# Patient Record
Sex: Female | Born: 1975 | Race: White | Hispanic: No | Marital: Married | Smoking: Never smoker
Health system: Southern US, Community
[De-identification: ages and names within clinical notes are randomized; demographics above are authoritative.]

## PROBLEM LIST (undated history)

## (undated) DIAGNOSIS — R011 Cardiac murmur, unspecified: Secondary | ICD-10-CM

## (undated) DIAGNOSIS — T7840XA Allergy, unspecified, initial encounter: Secondary | ICD-10-CM

## (undated) DIAGNOSIS — C50919 Malignant neoplasm of unspecified site of unspecified female breast: Secondary | ICD-10-CM

## (undated) DIAGNOSIS — Z1501 Genetic susceptibility to malignant neoplasm of breast: Secondary | ICD-10-CM

## (undated) DIAGNOSIS — K219 Gastro-esophageal reflux disease without esophagitis: Secondary | ICD-10-CM

## (undated) DIAGNOSIS — O1490 Unspecified pre-eclampsia, unspecified trimester: Secondary | ICD-10-CM

## (undated) HISTORY — DX: Allergy, unspecified, initial encounter: T78.40XA

## (undated) HISTORY — PX: INGUINAL HERNIA REPAIR: SHX194

## (undated) HISTORY — PX: SHOULDER SURGERY: SHX246

---

## 2000-12-21 ENCOUNTER — Other Ambulatory Visit: Admission: RE | Admit: 2000-12-21 | Discharge: 2000-12-21 | Payer: Self-pay | Admitting: Obstetrics and Gynecology

## 2002-01-17 ENCOUNTER — Other Ambulatory Visit: Admission: RE | Admit: 2002-01-17 | Discharge: 2002-01-17 | Payer: Self-pay | Admitting: Obstetrics and Gynecology

## 2004-05-07 ENCOUNTER — Encounter: Payer: Self-pay | Admitting: Internal Medicine

## 2004-12-10 ENCOUNTER — Other Ambulatory Visit: Admission: RE | Admit: 2004-12-10 | Discharge: 2004-12-10 | Payer: Self-pay | Admitting: Obstetrics and Gynecology

## 2005-01-22 ENCOUNTER — Ambulatory Visit: Payer: Self-pay | Admitting: Internal Medicine

## 2005-05-09 ENCOUNTER — Other Ambulatory Visit: Admission: RE | Admit: 2005-05-09 | Discharge: 2005-05-09 | Payer: Self-pay | Admitting: Obstetrics and Gynecology

## 2005-06-30 ENCOUNTER — Inpatient Hospital Stay (HOSPITAL_COMMUNITY): Admission: AD | Admit: 2005-06-30 | Discharge: 2005-07-02 | Payer: Self-pay | Admitting: Obstetrics and Gynecology

## 2005-08-05 ENCOUNTER — Other Ambulatory Visit: Admission: RE | Admit: 2005-08-05 | Discharge: 2005-08-05 | Payer: Self-pay | Admitting: Obstetrics and Gynecology

## 2005-10-17 ENCOUNTER — Ambulatory Visit: Payer: Self-pay | Admitting: Internal Medicine

## 2005-10-18 ENCOUNTER — Encounter: Payer: Self-pay | Admitting: Internal Medicine

## 2005-10-20 ENCOUNTER — Ambulatory Visit: Payer: Self-pay | Admitting: Internal Medicine

## 2005-10-28 ENCOUNTER — Ambulatory Visit: Payer: Self-pay | Admitting: Internal Medicine

## 2005-12-22 ENCOUNTER — Ambulatory Visit: Payer: Self-pay | Admitting: Internal Medicine

## 2006-04-20 ENCOUNTER — Ambulatory Visit: Payer: Self-pay | Admitting: Internal Medicine

## 2006-06-04 ENCOUNTER — Ambulatory Visit: Payer: Self-pay | Admitting: Internal Medicine

## 2006-06-05 ENCOUNTER — Encounter: Payer: Self-pay | Admitting: Internal Medicine

## 2006-09-21 ENCOUNTER — Ambulatory Visit: Payer: Self-pay | Admitting: Internal Medicine

## 2007-01-14 ENCOUNTER — Ambulatory Visit: Payer: Self-pay | Admitting: Internal Medicine

## 2007-04-09 ENCOUNTER — Ambulatory Visit: Payer: Self-pay | Admitting: Internal Medicine

## 2007-09-07 ENCOUNTER — Ambulatory Visit: Payer: Self-pay | Admitting: Internal Medicine

## 2008-01-13 DIAGNOSIS — J309 Allergic rhinitis, unspecified: Secondary | ICD-10-CM | POA: Insufficient documentation

## 2008-01-13 DIAGNOSIS — H1045 Other chronic allergic conjunctivitis: Secondary | ICD-10-CM

## 2008-01-14 ENCOUNTER — Ambulatory Visit: Payer: Self-pay | Admitting: Internal Medicine

## 2008-02-15 ENCOUNTER — Ambulatory Visit: Payer: Self-pay | Admitting: Internal Medicine

## 2008-07-27 ENCOUNTER — Ambulatory Visit: Payer: Self-pay | Admitting: Internal Medicine

## 2009-03-05 ENCOUNTER — Ambulatory Visit: Payer: Self-pay | Admitting: Internal Medicine

## 2010-02-01 ENCOUNTER — Ambulatory Visit: Payer: Self-pay | Admitting: Internal Medicine

## 2010-03-19 ENCOUNTER — Telehealth (INDEPENDENT_AMBULATORY_CARE_PROVIDER_SITE_OTHER): Payer: Self-pay | Admitting: *Deleted

## 2010-03-19 ENCOUNTER — Encounter: Payer: Self-pay | Admitting: Internal Medicine

## 2010-03-20 ENCOUNTER — Ambulatory Visit: Payer: Self-pay | Admitting: Internal Medicine

## 2010-07-15 ENCOUNTER — Ambulatory Visit: Payer: Self-pay | Admitting: Internal Medicine

## 2010-12-03 NOTE — Miscellaneous (Signed)
Summary: Injection Orders / [facility]    Injection Orders / [facility]    Imported By: Lennie Odor 04/12/2010 13:56:27  _____________________________________________________________________  External Attachment:    Type:   Image     Comment:   External Document

## 2010-12-03 NOTE — Progress Notes (Signed)
Summary: allergy vaccines  Phone Note Call from Patient   Caller: Patient Call For: young Summary of Call: pt was seen 4/1. says she was to have her allergy vaccines mailed to her and this has not yet happened. pt # [id number] x [phone] Initial call taken by: Tivis Ringer, CNA,  Mar 19, 2010 8:48 AM  Follow-up for Phone Call        Spoke with Dimas Millin; pt's [facility] name was Rosalie Gums and we found allergy serum hx; pt has not had since May 2010; please advise. Vivianne Spence.  Additional Follow-up for Phone Call Additional follow up Details #1::        I understood from last ov that patient had been taking allergy vaccine. It turns out she last got it a year ago. Will her mother still give her shots? She needs to come in here to pick up vaccine and restablish contact with the allergy lab. We would restart her at 1:50 and build by 0.1 ml/ vial / week to 0.5 ml/ week. New epipen. Otherwise no vaccine. Additional Follow-up by: Waymon Budge MD,  Mar 19, 2010 1:10 PM    New/Updated Medications: * ALLERGY VACCINE 1:50 MOTHER GIVES (W-E) Rebuild  Katie, Can you check on this we have nothing on this patient. Thanks, Lynnae Sandhoff

## 2010-12-03 NOTE — Assessment & Plan Note (Signed)
Summary: Betty [name]   Primary Provider/Referring Provider:  Burnell Blanks  CC:  2 year follow up visit-needs Epipen and allergy vaccine.Marland Kitchen  History of Present Illness: ALLERGIC CONJUNCTIVITIS (ICD-372.14) ALLERGIC RHINITIS (ICD-477.9) 01/14/08- Continues alergy vaccine at 1:10 given by mother Banker) without problems. Has one sinus infection this winter. No asthma. Has noted increased stuffiness and postnasal drip this month, treated adequately with an OTC Tylenol Allergy product.She expresses satisfaction with current level of control. There has been no repeat of the rash she develpoed last year, apparently unrelated to the vaccine.  February 01, 2010- Allergic rhinitis, allergic conjunctivitis Mother (RN) continues to give her allergy vaccine without problems. Has had some strep throats related to her 35 yo but otherwise has done well. She is currently using an Advil/ phenylephrine combo and we discussed antihistamines.   Current Medications (verified): 1)  Advil Congestion .... Take As Needed 2)  Epipen 0.3 Mg/0.24ml (1:1000)  Devi (Epinephrine Hcl (Anaphylaxis)) .... As Needed 3)  Allergy Vaccine 1:10 Mother Gives (W-E) 4)  Loestrin 24 .... Take 1 Tablet By Mouth Once A Day  Allergies (verified): 1)  ! * Dextromethorphan  Past History:  Past Medical History: Last updated: 01/14/2008 Allergic rhinitis allergic conjunctivitis  Family History: Last updated: 02/01/2010 Grandmother -allergic rhinitis Parents living  Social History: Last updated: 02/01/2010 Patient never smoked.  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    Vital Signs:  Patient profile:   35 year old female Weight:      172.25 pounds O2 Sat:      100 % on Room air Pulse rate:   76 / minute BP sitting:   140 / 84  (left arm) Cuff size:   regular  Vitals Entered By: Reynaldo Minium CMA (February 01, 2010 9:59 AM)  O2 Flow:  Room air  Physical Exam  Additional Exam:  General: A/Ox3; pleasant and cooperative, NAD, SKIN: no rash, lesions NODES: no lymphadenopathy HEENT: [place]/AT, EOM- WNL, Conjuctivae- clear, PERRLA, TM-WNL, Nose- clear, Throat- clear and wnl NECK: Supple w/ fair ROM, JVD- none, normal carotid impulses w/o bruits Thyroid- normal to palpation CHEST: Clear to P&A HEART: RRR, no m/g/r heard ABDOMEN: Soft and nl; ZOX:WRUE, nl pulses, no edema  NEURO: Grossly intact to observation      Impression & Recommendations:  Problem # 1:  ALLERGIC RHINITIS (ICD-477.9)  She is doing well. We had a prolonged discussion of antihistamines/ decongestants and availabiliry of other therapies if needed. We wil continue allergy vaccine, renew epipen.  Medications Added to Medication List This Visit: 1)  Advil Congestion  .... Take as needed  Other Orders: Est. Patient Level III (45409)  Patient Instructions: 1)  Please schedule a follow-up appointment in 1 year. 2)  Contniue allergy vaccine- Stop by allergy lab while you are here today to renew. 3)  script for epipen sent to your drug store. 4)  Consider otc antihistamines as needed- claritn/ loratadine and allegra/ fexofenadine are nonsedating and can be combined with whatever else  you need to take. Prescriptions: EPIPEN 0.3 MG/0.3ML (1:1000)  DEVI (EPINEPHRINE HCL (ANAPHYLAXIS)) as needed  #1 x prn   Entered and Authorized by:   Waymon Budge MD   Signed by:   Waymon Budge MD on 02/01/2010   Method used:   Electronically to        [facility]  [address] (retail)       [address]       [place], [place]  [address]       Ph: [phone]       Fax: [phone]   RxID:   [id number]

## 2010-12-31 ENCOUNTER — Ambulatory Visit (INDEPENDENT_AMBULATORY_CARE_PROVIDER_SITE_OTHER): Payer: BC Managed Care – PPO

## 2010-12-31 DIAGNOSIS — J301 Allergic rhinitis due to pollen: Secondary | ICD-10-CM

## 2011-01-31 ENCOUNTER — Ambulatory Visit: Payer: Self-pay | Admitting: Internal Medicine

## 2011-03-13 ENCOUNTER — Encounter: Payer: Self-pay | Admitting: Internal Medicine

## 2011-03-13 ENCOUNTER — Ambulatory Visit (INDEPENDENT_AMBULATORY_CARE_PROVIDER_SITE_OTHER): Payer: BC Managed Care – PPO | Admitting: Internal Medicine

## 2011-03-13 VITALS — BP 118/76 | HR 60 | Ht 70.0 in | Wt 171.8 lb

## 2011-03-13 DIAGNOSIS — I872 Venous insufficiency (chronic) (peripheral): Secondary | ICD-10-CM

## 2011-03-13 DIAGNOSIS — H1045 Other chronic allergic conjunctivitis: Secondary | ICD-10-CM

## 2011-03-13 DIAGNOSIS — J309 Allergic rhinitis, unspecified: Secondary | ICD-10-CM

## 2011-03-13 NOTE — Patient Instructions (Signed)
At next vaccine order, I will advance your vaccine strength back to I:10. The allergy lab will be able to give directions.

## 2011-03-13 NOTE — Progress Notes (Signed)
  Subjective:    Patient ID: Betty [name], female    DOB: 08-04-1976, 35 y.o.   MRN: [id number]  HPI  03/13/11- 15 yoF never smoker followed for allergic rhinitis and conjunctivitis. Last here February 01, 2010. After last here she got off  Allergy shots for awhile then restarted a few months ago because of her allergy pattern of burning eyes, crusty nose. She is now doing much better. She has Epipen and feels she is doing well enough. Shows me rash on lower right leg, that looks like stasis dermatitis. She has been pregnant, is on BCP, mother has hx of varicose veins. I cautioned stasis avoidance.  Review of Systems Constitutional:   No weight loss, night sweats,  Fevers, chills, fatigue, lassitude. HEENT:   No headaches,  Difficulty swallowing,  Tooth/dental problems,  Sore throat,                No current  sneezing, itching, ear ache, nasal congestion, post nasal drip,   CV:  No chest pain,  Orthopnea, PND, swelling in lower extremities, anasarca, dizziness, palpitations  GI  No heartburn, indigestion, abdominal pain, nausea, vomiting, diarrhea, change in bowel habits, loss of appetite  Resp: No shortness of breath with exertion or at rest.  No excess mucus, no productive cough,  No non-productive cough,  No coughing up of blood.  No change in color of mucus.  No wheezing.   Skin: no rash or lesions.  GU: no dysuria, change in color of urine, no urgency or frequency.  No flank pain.  MS:  No joint pain or swelling.  No decreased range of motion.  No back pain.  Psych:  No change in mood or affect. No depression or anxiety.  No memory loss.      Objective:   Physical Exam .General- Alert, Oriented, Affect-appropriate, Distress- none acute  Skin- rash-none, lesions- none, excoriation- none     Stasis dermatitis right lower leg  Lymphadenopathy- none  Head- atraumatic  Eyes- Gross vision intact, PERRLA, conjunctivae clear secretions  Ears- Normal-Hearing, canals, Tm  Nose-  Clear,  No- Septal dev, mucus, polyps, erosion, perforation   Throat- Mallampati II , mucosa clear , drainage- none, tonsils- atrophic  Neck- flexible , trachea midline, no stridor , thyroid nl, carotid no bruit  Chest - symmetrical excursion , unlabored     Heart/CV- RRR , no murmur , no gallop  , no rub, nl s1 s2                     - JVD- none , edema- none, stasis changes- none, varices- none     Lung- clear to P&A, wheeze- none, cough- none , dullness-none, rub- none     Chest wall-   Abd- tender-no, distended-no, bowel sounds-present, HSM- no  Br/ Gen/ Rectal- Not done, not indicated  Extrem- cyanosis- none, clubbing, none, atrophy- none, strength- nl. Stasis dermatitis on right lower leg. Neg Homan's  Neuro- grossly intact to observation         Assessment & Plan:

## 2011-03-13 NOTE — Assessment & Plan Note (Addendum)
Doing well on review. We will advance to 1:10 at next order. Consider adding a nasal steroid in peak season.  Risk benefit of allergy vaccine and epinephrine were discussed.

## 2011-03-15 ENCOUNTER — Encounter: Payer: Self-pay | Admitting: Internal Medicine

## 2011-03-15 DIAGNOSIS — I872 Venous insufficiency (chronic) (peripheral): Secondary | ICD-10-CM | POA: Insufficient documentation

## 2011-03-15 NOTE — Assessment & Plan Note (Signed)
She can supplement with eye drops and antihistamines when needed. Discussed masks, sunglasses, deflection of AC draft.

## 2011-03-15 NOTE — Assessment & Plan Note (Signed)
She has potential for progression of this problem. I educated on elevation, support hose, avoidance of stasis.

## 2011-03-21 NOTE — Assessment & Plan Note (Signed)
[facility]                               PULMONARY OFFICE NOTE   Betty [name], Betty [name]                     MRN:          [id number]  DATE:06/04/2006                            DOB:          02-12-1976    PROBLEMS:  1.  Allergic rhinitis.  2.  Allergic conjunctivitis.   HISTORY:  She was restarted on allergy vaccine in December and has been  doing very well with no problems or reactions at all.  Injections are given  by her mother who is an Charity fundraiser and they have an EpiPen.  She denies any problems  with itching, sneezing, chest tightness, or wheeze.   MEDICATIONS:  Birth control pills.  Occasional over-the-counter Tylenol for allergy.   MEDICATION ALLERGIES:  None.   OBJECTIVE:  VITALS:  Weight 176 pounds.  Blood pressure 138/80.  Pulse  regular, 49.  Room air saturation 99%.  GENERAL:  She looks very comfortable.  SKIN/EYES/NOSE/ THROAT:  All clear.  I find no adenopathy.  LUNG FIELDS:  Clear.  Unlabored breathing.  No cough or wheeze.  HEART SOUNDS:  Regular without murmur or gallop.   IMPRESSION:  Allergic rhinitis and allergic conjunctivitis, doing very well,  at least at this time of year.   PLAN:  1.  again, went through a detailed discussion of risks, benefits,      considerations concerning allergy vaccine including the option not to      take shots and the option to adjust dose.  We discussed policy      concerning administration outside of the medical office, EpiPen, and      anaphylaxis.  She wishes to continue getting her injections by her      mother at home.      1.  I refilled her EpiPen prescription with discussion.      2.  With next vaccine order, advance to 1:10.      3.  Schedule return with me in six months, earlier p.r.n.                                   Clinton D. Maple Hudson, MD, FCCP, FACP   CDY/MedQ  DD:  06/05/2006  DT:  06/06/2006  Job #:  [id number]   cc:   Dr. Burnell Blanks

## 2011-03-21 NOTE — Assessment & Plan Note (Signed)
 [facility]                               PULMONARY OFFICE NOTE   DENIESHA, STENGLEIN                     MRN:          [id number]  DATE:10/17/2005                            DOB:          1975-12-11    PROBLEMS:  1.  Allergic rhinitis.  2.  Allergic conjunctivitis.   HISTORY:  She has successfully restarted allergy vaccine now at 1:50 being  given by her mother.  She has an EpiPen, which she has never needed, and is  having no reactions.  We spent time today again going over the policy and  issues of vaccine administration away from a medical facility, risks,  anaphylaxis, epinephrine, and options including discontinuing vaccine or  getting it administered elsewhere.  She reviewed our waiver and chooses to  continue what they are doing, accepting the risks.  I feel she had a good  understanding of the issues we discussed.   MEDICATIONS:  Birth control pills, allergy vaccine, occasional over-the-  counter antihistamine decongestant.   OBJECTIVE:  VITAL SIGNS: Weight 176 pounds; BP 138/80; pulse regular 49;  room air saturation 99%.  GENERAL:  Alert, comfortable-appearing young woman.  SKIN:  No rash.  ADENOPATHY:  None found at the neck, shoulders, or axillae.  HEENT:  Conjunctivae clear.  Nasal mucosa looks normal.  Pharynx is clear  with no erythema or postnasal drainage.  Voice quality is normal.  CHEST:  Lungs are clear to P&A.  Heart sounds are regular without murmur or  gallop.   IMPRESSION:  Allergic rhinitis and allergic conjunctivitis under good  control.   PLAN:  EpiPen was refilled with instruction and discussion again.  Risk  discussion as above.  With next order, we will advance vaccine to 1:10.  Scheduled to return in 6 months and probably 1 time a year if she remains  stable.                                   Clinton D. Maple Hudson, MD, FCCP, FACP   CDY/MedQ  DD:  06/05/2006  DT:  06/05/2006  Job #:  [id number]

## 2011-03-21 NOTE — Assessment & Plan Note (Signed)
[facility]                             PULMONARY OFFICE NOTE   ASPYN, WARNKE                     MRN:          [id number]  DATE:01/14/2007                            DOB:          1976/06/02    PROBLEMS:  1. Allergic rhinitis.  2. Allergic conjunctivitis.   HISTORY:  She is now up to 1:10 on vaccine given by her mother, a nurse,  with no problems.  They have an EpiPen, but have had no concerns.  In  early January she had had some pruritus and rash which at first seemed  related to the injection site.  It became generalized.  We held her  vaccine and tried prednisone and Benadryl.  The rash faded and did not  recur when vaccine was restarted at low dose and built.  Cause is  unexplained but she is now doing quite well.  She noticed minimal  stuffiness from Spring pollen.   MEDICATIONS:  Birth control pills, allergy vaccine, Tylenol Allergy,  EpiPen.   NO MEDICATION ALLERGY.   OBJECTIVE:  Weight 170 pounds, blood pressure 126/82, pulse regular 54,  room air saturation 99%.  She looks very comfortable, there is no  evident rash, and she reports none.  No adenopathy.  Eyes, nose, throat,  and chest were all clear.  HEART:  Sounds normal.   IMPRESSION:  Allergic rhinitis and allergic conjunctivitis, currently  minimal.  We will watch how he does through the Spring pollen season  although she says complaints are usually nonseasonal.  Self limited rash  was not related to her vaccine.   PLAN:  Continue vaccine at 1:10.  We discussed use of Sudafed or  Claritin OTC p.r.n.  Schedule return 1 year, but earlier p.r.n.     Clinton D. Maple Hudson, MD, Tonny Bollman, FACP  Electronically Signed    CDY/MedQ  DD: 01/14/2007  DT: 01/16/2007  Job #: [id number]   cc:   Burnell Blanks, M.D.

## 2011-09-17 ENCOUNTER — Ambulatory Visit (INDEPENDENT_AMBULATORY_CARE_PROVIDER_SITE_OTHER): Payer: BC Managed Care – PPO

## 2011-09-17 DIAGNOSIS — J309 Allergic rhinitis, unspecified: Secondary | ICD-10-CM

## 2012-02-17 ENCOUNTER — Other Ambulatory Visit: Payer: Self-pay | Admitting: Obstetrics and Gynecology

## 2012-02-17 DIAGNOSIS — N6310 Unspecified lump in the right breast, unspecified quadrant: Secondary | ICD-10-CM

## 2012-02-20 ENCOUNTER — Other Ambulatory Visit: Payer: Self-pay | Admitting: Obstetrics and Gynecology

## 2012-02-20 ENCOUNTER — Ambulatory Visit
Admission: RE | Admit: 2012-02-20 | Discharge: 2012-02-20 | Disposition: A | Discharge status: Home / Self Care | Payer: BC Managed Care – PPO | Source: Ambulatory Visit | Attending: Obstetrics and Gynecology | Admitting: Obstetrics and Gynecology

## 2012-02-20 DIAGNOSIS — N6310 Unspecified lump in the right breast, unspecified quadrant: Secondary | ICD-10-CM

## 2012-02-20 DIAGNOSIS — N63 Unspecified lump in unspecified breast: Secondary | ICD-10-CM

## 2012-02-23 ENCOUNTER — Other Ambulatory Visit: Payer: Self-pay | Admitting: Obstetrics and Gynecology

## 2012-02-23 ENCOUNTER — Ambulatory Visit
Admission: RE | Admit: 2012-02-23 | Discharge: 2012-02-23 | Disposition: A | Discharge status: Home / Self Care | Payer: BC Managed Care – PPO | Source: Ambulatory Visit | Attending: Obstetrics and Gynecology | Admitting: Obstetrics and Gynecology

## 2012-02-23 DIAGNOSIS — N63 Unspecified lump in unspecified breast: Secondary | ICD-10-CM

## 2012-02-23 DIAGNOSIS — C50911 Malignant neoplasm of unspecified site of right female breast: Secondary | ICD-10-CM

## 2012-02-24 ENCOUNTER — Telehealth: Payer: Self-pay | Admitting: *Deleted

## 2012-02-24 ENCOUNTER — Other Ambulatory Visit: Payer: Self-pay | Admitting: *Deleted

## 2012-02-24 DIAGNOSIS — C50519 Malignant neoplasm of lower-outer quadrant of unspecified female breast: Secondary | ICD-10-CM

## 2012-02-24 DIAGNOSIS — C50511 Malignant neoplasm of lower-outer quadrant of right female breast: Secondary | ICD-10-CM | POA: Insufficient documentation

## 2012-02-24 NOTE — Telephone Encounter (Signed)
Confirmed [facility] for 03/03/12 at 0800 .  Instructions and contact information given.  

## 2012-02-27 ENCOUNTER — Ambulatory Visit
Admission: RE | Admit: 2012-02-27 | Discharge: 2012-02-27 | Disposition: A | Discharge status: Home / Self Care | Payer: BC Managed Care – PPO | Source: Ambulatory Visit | Attending: Obstetrics and Gynecology | Admitting: Obstetrics and Gynecology

## 2012-02-27 DIAGNOSIS — C50911 Malignant neoplasm of unspecified site of right female breast: Secondary | ICD-10-CM

## 2012-02-27 MED ORDER — GADOBENATE DIMEGLUMINE 529 MG/ML IV SOLN
14.0000 mL | Freq: Once | INTRAVENOUS | Status: AC | PRN
  Administered 2012-02-27: 14 mL via INTRAVENOUS

## 2012-03-03 ENCOUNTER — Encounter: Payer: Self-pay | Admitting: Oncology

## 2012-03-03 ENCOUNTER — Other Ambulatory Visit (HOSPITAL_BASED_OUTPATIENT_CLINIC_OR_DEPARTMENT_OTHER): Payer: BC Managed Care – PPO | Admitting: Lab

## 2012-03-03 ENCOUNTER — Telehealth: Payer: Self-pay | Admitting: *Deleted

## 2012-03-03 ENCOUNTER — Ambulatory Visit (HOSPITAL_BASED_OUTPATIENT_CLINIC_OR_DEPARTMENT_OTHER): Payer: BC Managed Care – PPO | Admitting: Surgery

## 2012-03-03 ENCOUNTER — Ambulatory Visit: Payer: BC Managed Care – PPO | Attending: Surgery | Admitting: Physical Therapy

## 2012-03-03 ENCOUNTER — Ambulatory Visit: Payer: BC Managed Care – PPO

## 2012-03-03 ENCOUNTER — Ambulatory Visit
Admission: RE | Admit: 2012-03-03 | Discharge: 2012-03-03 | Disposition: A | Discharge status: Home / Self Care | Payer: BC Managed Care – PPO | Source: Ambulatory Visit | Attending: Radiation Oncology | Admitting: Radiation Oncology

## 2012-03-03 ENCOUNTER — Ambulatory Visit (HOSPITAL_BASED_OUTPATIENT_CLINIC_OR_DEPARTMENT_OTHER): Payer: BC Managed Care – PPO | Admitting: Oncology

## 2012-03-03 ENCOUNTER — Encounter: Payer: Self-pay | Admitting: Specialist

## 2012-03-03 ENCOUNTER — Encounter (INDEPENDENT_AMBULATORY_CARE_PROVIDER_SITE_OTHER): Payer: Self-pay | Admitting: Surgery

## 2012-03-03 VITALS — BP 140/84 | HR 76 | Temp 98.0°F | Resp 16

## 2012-03-03 VITALS — BP 147/93 | HR 68 | Temp 97.3°F | Ht 69.0 in | Wt 170.7 lb

## 2012-03-03 DIAGNOSIS — Z8041 Family history of malignant neoplasm of ovary: Secondary | ICD-10-CM

## 2012-03-03 DIAGNOSIS — Z171 Estrogen receptor negative status [ER-]: Secondary | ICD-10-CM

## 2012-03-03 DIAGNOSIS — C50519 Malignant neoplasm of lower-outer quadrant of unspecified female breast: Secondary | ICD-10-CM

## 2012-03-03 DIAGNOSIS — C50911 Malignant neoplasm of unspecified site of right female breast: Secondary | ICD-10-CM

## 2012-03-03 DIAGNOSIS — M259 Joint disorder, unspecified: Secondary | ICD-10-CM | POA: Insufficient documentation

## 2012-03-03 DIAGNOSIS — IMO0001 Reserved for inherently not codable concepts without codable children: Secondary | ICD-10-CM | POA: Insufficient documentation

## 2012-03-03 DIAGNOSIS — C50919 Malignant neoplasm of unspecified site of unspecified female breast: Secondary | ICD-10-CM

## 2012-03-03 LAB — COMPREHENSIVE METABOLIC PANEL
Albumin: 4 g/dL (ref 3.5–5.2)
BUN: 13 mg/dL (ref 6–23)
Calcium: 9.2 mg/dL (ref 8.4–10.5)
Chloride: 102 mEq/L (ref 96–112)
Creatinine, Ser: 0.89 mg/dL (ref 0.50–1.10)
Glucose, Bld: 91 mg/dL (ref 70–99)
Potassium: 3.9 mEq/L (ref 3.5–5.3)

## 2012-03-03 LAB — CBC WITH DIFFERENTIAL/PLATELET
Basophils Absolute: 0 10*3/uL (ref 0.0–0.1)
EOS%: 3.7 % (ref 0.0–7.0)
HCT: 42.7 % (ref 34.8–46.6)
HGB: 14.3 g/dL (ref 11.6–15.9)
LYMPH%: 39.1 % (ref 14.0–49.7)
MCH: 29.7 pg (ref 25.1–34.0)
MONO#: 0.2 10*3/uL (ref 0.1–0.9)
NEUT%: 51.3 % (ref 38.4–76.8)
Platelets: 242 10*3/uL (ref 145–400)
lymph#: 1.8 10*3/uL (ref 0.9–3.3)

## 2012-03-03 LAB — CANCER ANTIGEN 27.29: CA 27.29: 22 U/mL (ref 0–39)

## 2012-03-03 MED ORDER — PROCHLORPERAZINE 25 MG RE SUPP: 25.0000 mg | Freq: Two times a day (BID) | RECTAL | Status: DC | PRN

## 2012-03-03 MED ORDER — DEXAMETHASONE 4 MG PO TABS: ORAL_TABLET | ORAL | Status: DC

## 2012-03-03 MED ORDER — ONDANSETRON HCL 8 MG PO TABS: ORAL_TABLET | ORAL | Status: DC

## 2012-03-03 MED ORDER — PROCHLORPERAZINE MALEATE 10 MG PO TABS: 10.0000 mg | ORAL_TABLET | Freq: Four times a day (QID) | ORAL | Status: DC | PRN

## 2012-03-03 MED ORDER — LIDOCAINE-PRILOCAINE 2.5-2.5 % EX CREA: TOPICAL_CREAM | Freq: Once | CUTANEOUS | Status: DC

## 2012-03-03 MED ORDER — LORAZEPAM 0.5 MG PO TABS: 0.5000 mg | ORAL_TABLET | Freq: Four times a day (QID) | ORAL | Status: DC | PRN

## 2012-03-03 NOTE — Progress Notes (Signed)
Patient came in today as a new patient with her parents,she has one insurance.I did explain to her the programs we offer here at the cancer center which is the co-pay assistance for medication and co-pay assistance for chemo drugs,she said sure.

## 2012-03-03 NOTE — Progress Notes (Signed)
Patient ID: Betty [name], female   DOB: [birth year], 36 y.o.   MRN: [id number]  Chief Complaint  Patient presents with  . Other    right breast cancer    HPI Betty [name] is a 36 y.o. female.   HPI  She is referred by Dr. Nathanial [name] after finding a palpable right breast mass. It is been biopsied and cancer that confirmed. She has had no previous problems with her breast. She denies nipple discharge. Her family history is positive for ovarian cancer. She is otherwise healthy and without complaints.  Past Medical History  Diagnosis Date  . Allergic rhinitis   . Allergic conjunctivitis     Past Surgical History  Procedure Date  . Other surgical history     right shoulder x 2  . Inguinal hernia repair     as an infant  . Shoulder surgery     x 2- labrum and capsular repair    Family History  Problem Relation Age of Onset  . Allergic rhinitis      grandmother  . Cancer Mother     ovarian  . Cancer Paternal Uncle     leukemia  . Cancer Paternal Uncle     lung cancer    Social History History  Substance Use Topics  . Smoking status: Never Smoker   . Smokeless tobacco: Not on file  . Alcohol Use: Not on file    Allergies  Allergen Reactions  . Dextromethorphan     Current Outpatient Prescriptions  Medication Sig Dispense Refill  . EPINEPHrine (EPIPEN) 0.3 mg/0.3 mL DEVI Inject 0.3 mg into the muscle once.        . loratadine (CLARITIN) 10 MG tablet Take 10 mg by mouth daily.        . Norethin Ace-Eth Estrad-FE (LOESTRIN 24 FE PO) Take by mouth daily.          Review of Systems Review of Systems  Constitutional: Negative for fever, chills and unexpected weight change.  HENT: Negative for hearing loss, congestion, sore throat, trouble swallowing and voice change.   Eyes: Negative for visual disturbance.  Respiratory: Negative for cough and wheezing.   Cardiovascular: Negative for chest pain, palpitations and leg swelling.  Gastrointestinal: Negative for  nausea, vomiting, abdominal pain, diarrhea, constipation, blood in stool, abdominal distention and anal bleeding.  Genitourinary: Negative for hematuria, vaginal bleeding and difficulty urinating.  Musculoskeletal: Negative for arthralgias.  Skin: Negative for rash and wound.  Neurological: Negative for seizures, syncope and headaches.  Hematological: Negative for adenopathy. Does not bruise/bleed easily.  Psychiatric/Behavioral: Negative for confusion.    Blood pressure 140/84, pulse 76, temperature 98 F (36.7 C), resp. rate 16.  Physical Exam Physical Exam  Constitutional: She is oriented to person, place, and time. She appears well-developed and well-nourished. No distress.  HENT:  Head: Normocephalic and atraumatic.  Right Ear: External ear normal.  Left Ear: External ear normal.  Nose: Nose normal.  Mouth/Throat: Oropharynx is clear and moist. No oropharyngeal exudate.  Eyes: Conjunctivae are normal. Pupils are equal, round, and reactive to light. Right eye exhibits no discharge. Left eye exhibits no discharge. No scleral icterus.  Neck: Normal range of motion. Neck supple. No tracheal deviation present. No thyromegaly present.  Cardiovascular: Normal rate, regular rhythm, normal heart sounds and intact distal pulses.   No murmur heard. Pulmonary/Chest: Effort normal and breath sounds normal. No respiratory distress. She has no wheezes.  Abdominal: Soft. Bowel sounds are normal.  Musculoskeletal: Normal  range of motion. She exhibits edema. She exhibits no tenderness.  Lymphadenopathy:    She has no cervical adenopathy.    She has no axillary adenopathy.  Neurological: She is alert and oriented to person, place, and time.  Skin: Skin is warm and dry. No rash noted. She is not diaphoretic. No erythema.  Psychiatric: Her behavior is normal. Judgment normal.  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 Port-A-Cath insertion has been requested. I discussed this with her family in detail. I discussed the risk of the procedure which include but not limited to bleeding, infection, pneumothorax, etc. She understands and wishes to proceed. Surgery will be scheduled for as soon as possible.  Likelihood of success is good.       Betty [name] A 03/03/2012, 10:07 AM

## 2012-03-03 NOTE — Progress Notes (Signed)
[facility] Radiation Oncology NEW PATIENT EVALUATION  Name: Betty [name] MRN: [id number]  Date:   03/03/2012           DOB: August 14, 1976  Status: outpatient   CC:   Shelly Rubenstein, MD , Dr. Richarda Overlie, Dr. 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 female who is seen today at the [facility] for discussion of possible radiation therapy in the management of her clinical stage IIA (T2 N0 M0) invasive ductal carcinoma of the right breast. Approximately 2 months ago she noted development of a painful mass along the lower outer quadrant of the right breast. Right breast ultrasound on 02/20/2012 showed a 2.1 x 1.1 x 1.6 cm mass at 8:00, 1 cm from the nipple. This was palpable. An ultrasound-guided biopsy was diagnostic for invasive ductal carcinoma along with DCIS which was triple negative along with a Ki-67 of 98% mammography showed a clip at 8:00. Breast MRI showed a solitary 2.2 cm mass at 8:00 with no evidence of malignancy the left breast or evidence for lymph node involvement. She met with Dr. Donnie Coffin of medical oncology and Dr. Magnus Ivan of general surgery this morning as well.  PREVIOUS RADIATION THERAPY: No   PAST MEDICAL HISTORY:  has a past medical history of Allergic rhinitis and Allergic conjunctivitis.     PAST SURGICAL HISTORY:  Past Surgical History  Procedure Date  . Other surgical history     right shoulder x 2  . Inguinal hernia repair     as an infant  . Shoulder surgery     x 2- labrum and capsular repair     FAMILY HISTORY: family history includes Allergic rhinitis in an unspecified family member and Cancer in her mother and paternal uncles. Her mother was diagnosed with ovarian cancer at age 24 and is without recurrent disease at age 48. Her father is alive at age  94.   SOCIAL HISTORY:  reports that she has never smoked. She does not have any smokeless tobacco history on file. 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 Divorced, 1 son age 70.   ALLERGIES: Dextromethorphan   MEDICATIONS:  Current Outpatient Prescriptions  Medication Sig Dispense Refill  . dexamethasone (DECADRON) 4 MG tablet Take 2 tablets by mouth once a day on the day after chemotherapy and then take 2 tablets two times a day for 2 days. Take with food.  30 tablet  1  . EPINEPHrine (EPIPEN) 0.3 mg/0.3 mL DEVI Inject 0.3 mg into the muscle once.        . loratadine (CLARITIN) 10 MG tablet Take 10 mg by mouth daily.        Marland Kitchen LORazepam (ATIVAN) 0.5 MG tablet Take 1 tablet (0.5 mg total) by mouth every 6 (six) hours as needed (Nausea or vomiting).  30 tablet  0  . Norethin Ace-Eth Estrad-FE (LOESTRIN 24 FE PO) Take by mouth daily.        . ondansetron (ZOFRAN) 8 MG tablet Take 1 tablet two times a day as needed for nausea or vomiting starting on the third day after chemotherapy.  30 tablet  1  . prochlorperazine (COMPAZINE) 10 MG tablet Take 1 tablet (10 mg total) by mouth every 6 (six) hours as needed (Nausea or  vomiting).  30 tablet  1  . prochlorperazine (COMPAZINE) 25 MG suppository Place 1 suppository (25 mg total) rectally every 12 (twelve) hours as needed for nausea.  12 suppository  3   No current facility-administered medications for this encounter.   Facility-Administered Medications Ordered in Other Encounters  Medication Dose Route Frequency Provider Last Rate Last Dose  . lidocaine-prilocaine (EMLA) cream   Topical Once Pierce Crane, MD         REVIEW OF SYSTEMS:  Pertinent items are noted in HPI.    PHYSICAL EXAM: Alert and oriented 36 year old white female appearing her stated age. Wt Readings from Last 3 Encounters:  03/03/12 170 lb 11.2 oz (77.429 kg)  03/13/11 171 lb 12.8 oz (77.928 kg)  02/01/10 172 lb 4 oz (78.132 kg)   Temp Readings  from Last 3 Encounters:  03/03/12 98 F (36.7 C)   03/03/12 97.3 F (36.3 C)    BP Readings from Last 3 Encounters:  03/03/12 140/84  03/03/12 147/93  03/13/11 118/76   Pulse Readings from Last 3 Encounters:  03/03/12 76  03/03/12 68  03/13/11 60   Head and neck examination: Grossly unremarkable. Nodes: Without palpable cervical, supraclavicular, or axillary lymphadenopathy. Chest: Lungs clear. Back: Without spinal or CVA tenderness. Heart: Regular rate and rhythm. Breasts: She is small and medium breasted.  There is an area of ecchymosis with a palpable mass measuring approximately 2.0 cm at 8:00 adjacent to the areolar edge. There is no skin involvement. Left breast without masses or lesions. Abdomen without hepatomegaly. Extremities without edema. Neurologic examination grossly nonfocal.   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 Considering her mother who had ovarian cancer and her young age, she is certainly a candidate for genetic testing to rule out BRCA1/BRCA2 mutations. She may also have a non-BRCA mutation. Dr. Donnie Coffin and scheduling completion of her staging workup with a PET scan which I think is certainly appropriate considering her triple negative disease.  If she is BRCA positive then I would recommend bilateral mastectomies. Based on her clinical presentation is unlikely that she would need post mastectomy radiation therapy. If she is felt to be genetically negative that she could have breast conserving surgery. She is felt to be a good  candidate for neoadjuvant chemotherapy while she undergoes genetic testing. We discussed the potential acute and late toxicities of radiation therapy in this can be revisited depending on her choice of therapy following definitive surgery. We also discussed the possibility of fertility preservation consultation and other implications should she be found to be genetically positive.   PLAN: As discussed above. Dr. Donnie Coffin will make arrangements for her staging and neoadjuvant chemotherapy and she can be represented following neoadjuvant chemotherapy or after definitive surgery.   I spent 40 minutes minutes face to face with the patient and more than 50% of that time was spent in counseling and/or coordination of care.

## 2012-03-03 NOTE — Progress Notes (Signed)
I met patient in the multidisciplinary breast clinic.  Patient is 36 years old, divorced, and has a 54 year old son.  I reviewed with her the distress screening and informed her about available support services, including the [facility] Support Group.  I also told her about [facility] and, at her request, I will make a referral on her behalf for a Reach volunteer.  I also told her about [facility] as a resource for her child.

## 2012-03-03 NOTE — Telephone Encounter (Signed)
made patient appointment for pet scan on 03-09-2012 at 7:00am genetics counseling on 03-08-2012 echo 03-08-2012 printed out calendar and gave to the patient sent christine schere message thru epic requesting at time for 03-15-2012 patient treatment starts at 11:00am

## 2012-03-03 NOTE — Progress Notes (Signed)
Referral MD Dr Clemetine Marker N/A    Reason for Referral: Palpable breast mass   Chief Complaint  Patient presents with  . Breast Cancer    right  :   HPI: This is a pleasant 36 year old woman from [place] who presents with a palpable mass in her right breast. She initially appreciated this sometime in January. This is a subtle sensation at the time. She recently felt this more readily after her child rolled  over on her chest. She went to her primary care Dr. who says he referred her to mammography. A bilateral mammogram right breast ultrasound performed 02/20/2012 showed irregular hypoechoic mass a clock position measuring 2.1 x 1.1 x 1.6 cm. A biopsy performed same day showed this to be a high-grade invasive ductal cancer, ER and PR negative, Ki-67 19%, HER-2 negative ratio 1.28. MRI scan performed 02/27/2012 showed this mass to be 2.2 x 1.6 x 1.9 cm. No other masses were seen.   Past Medical History  Diagnosis Date  . Allergic rhinitis   . Allergic conjunctivitis   :  Past Surgical History  Procedure Date  . Other surgical history     right shoulder x 2  . Inguinal hernia repair     as an infant  . Shoulder surgery     x 2- labrum and capsular repair  :  Current outpatient prescriptions:EPINEPHrine (EPIPEN) 0.3 mg/0.3 mL DEVI, Inject 0.3 mg into the muscle once.  , Disp: , Rfl: ;  loratadine (CLARITIN) 10 MG tablet, Take 10 mg by mouth daily.  , Disp: , Rfl: ;  Norethin Ace-Eth Estrad-FE (LOESTRIN 24 FE PO), Take by mouth daily.  , Disp: , Rfl: :    :  Allergies  Allergen Reactions  . Dextromethorphan   :  Family History  Problem Relation Age of Onset  . Allergic rhinitis      grandmother  . Cancer Mother age 58, s/p TAH-BSO stage 3b, s/p chemo x 8 months.     ovarian  . Cancer Paternal Uncle     leukemia  . Cancer Paternal Uncle     lung cancer  :  History   Social History  . Marital Status: Married    Spouse Name: N/A    Number of Children: 1,  age 65  . Years of Education: Post secondary;    Occupational History  . 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 Smoking status: Never Smoker   . Smokeless tobacco: Not on file  . Alcohol Use: Not on file  . Drug Use: Not on file  . Sexually Active: Not on file   Other Topics Concern  . Not on file   Social History Narrative  . No narrative on file  :  A comprehensive review of systems was negative.  Exam: @IPVITALS @ General appearance: alert, cooperative and appears stated age Eyes: conjunctivae/corneas clear. PERRL, EOM's intact. Fundi benign. Throat: lips, mucosa, and tongue normal; teeth and gums normal Resp: clear to auscultation bilaterally and normal percussion bilaterally Breasts: normal appearance, masses or tenderness, there is a mass palpable in the central portion of breast measuring about 2 cm appreciated about the 8:00 position,  Cardio: regular rate and rhythm, S1, S2 normal, no murmur, click, rub or gallop and normal apical impulse GI: soft, non-tender; bowel sounds normal; no masses,  no organomegaly Pulses: 2+  and symmetric Lymph nodes: Cervical, supraclavicular, and axillary nodes normal. Neurologic: Grossly normal   Basename 03/03/12 0823  WBC 4.5  HGB 14.3  HCT 42.7  PLT 242    Basename 03/03/12 0823  NA 137  K 3.9  CL 102  CO2 26  GLUCOSE 91  BUN 13  CREATININE 0.89  CALCIUM 9.2    Blood smear review: n/a  Pathology:as above  US Breast Right  02/20/2012  *RADIOLOGY REPORT*  Clinical Data:  Palpable right breast mass in a 36 year old female  DIGITAL DIAGNOSTIC BILATERAL MAMMOGRAM WITH CAD AND RIGHT BREAST ULTRASOUND:  Comparison:  None.  Findings:  There are scattered fibroglandular densities.  Far posteriorly in the lateral aspect of the right breast is an obscured mass with distortion.  The mass is difficult to measure, it is  approximately 2.4 cm.  There are no associated malignant-type microcalcifications.  The left breast is negative. Mammographic images were processed with CAD.  On physical exam, I palpate a discrete mass in the right breast at 8 o'clock 1 cm from the nipple.  Ultrasound is performed, showing there is an irregular, hypoechoic mass in the right breast at 8 o'clock 1 cm from the nipple measuring 2.1 x 1.1 x 1.6 cm.  Sonographic evaluation of the right axilla fails to reveal any enlarged adenopathy.  IMPRESSION: Suspicious right breast mass.  Tissue sampling is recommended. Ultrasound-guided core biopsy will be performed and dictated separately.  BI-RADS CATEGORY 4:  Suspicious abnormality - biopsy should be considered.  Original Report Authenticated By: Littie Deeds. Judyann Munson, M.D.   Mr Breast Bilateral W Wo Contrast  02/27/2012  *RADIOLOGY REPORT*  Clinical Data: Recently diagnosed invasive and in situ ductal carcinoma following biopsy of a palpable mass in the 8 o'clock position of the right breast.  BILATERAL BREAST MRI WITH AND WITHOUT CONTRAST  Technique: Multiplanar, multisequence MR images of both breasts were obtained prior to and following the intravenous administration of 14ml of Multihance.  Three dimensional images were evaluated at the independent DynaCad workstation.  Comparison:  Bilateral mammogram, right breast ultrasound, right breast ultrasound guided biopsy, and post-biopsy clip mammogram 02/20/2012.  Findings: There is a mild background parenchymal enhancement pattern bilaterally.  In the posterior third of the 8 o'clock position of the right breast is a 2.2 x 1.6 x 1.9 cm irregular mass with heterogeneous enhancment and washout kinetics.  There is hyperemia surrounding the mass.  Biopsy clip artifact is seen within the mass. No additional suspicious mass or enhancement is seen in the right breast.  No mass or suspicious enhancement is identified the left breast to suggest malignancy.  No axillary or  internal mammary chain lymphadenopathy is identified.  IMPRESSION:  1.  Solitary 2.2 cm biopsy-proven carcinoma in the 8 o'clock position of the right breast.  2.  No MRI evidence of malignancy in the left breast.  THREE-DIMENSIONAL MR IMAGE RENDERING ON INDEPENDENT WORKSTATION:  Three-dimensional MR images were rendered by post-processing of the original MR data on an independent workstation.  The three- dimensional MR images were interpreted, and findings were reported in the accompanying complete MRI report for this study.  BI-RADS CATEGORY 6:  Known biopsy-proven malignancy - appropriate action should be taken.  Original Report Authenticated By: Britta Mccreedy, M.D.   [place] Core Biopsy  02/20/2012  *RADIOLOGY REPORT*  Clinical Data:  Suspicious right breast mass  ULTRASOUND GUIDED VACUUM ASSISTED CORE BIOPSY OF THE RIGHT BREAST  Comparison: None.  I met with the patient and we discussed  the procedure of ultrasound- guided biopsy, including benefits and alternatives.  We discussed the high likelihood of a successful procedure. We discussed the risks of the procedure, including infection, bleeding, tissue injury, clip migration, and inadequate sampling.  Informed, written consent was given.  Using sterile technique, 2% lidocaine, ultrasound guidance, and a 12 gauge vacuum assisted needle, biopsy was performed of a mass in the 8 o'clock region of the right breast.  At the conclusion of the procedure, a tissue marker clip was deployed into the biopsy cavity.  Follow-up 2-view mammogram was performed and dictated separately.  IMPRESSION: Ultrasound-guided biopsy of the right breast.  No apparent complications.  Original Report Authenticated By: Littie Deeds. Judyann Munson, M.D.   Mm Digital Diagnostic Bilat  02/20/2012  *RADIOLOGY REPORT*  Clinical Data:  Palpable right breast mass in a 36 year old female  DIGITAL DIAGNOSTIC BILATERAL MAMMOGRAM WITH CAD AND RIGHT BREAST ULTRASOUND:  Comparison:  None.  Findings:  There are  scattered fibroglandular densities.  Far posteriorly in the lateral aspect of the right breast is an obscured mass with distortion.  The mass is difficult to measure, it is approximately 2.4 cm.  There are no associated malignant-type microcalcifications.  The left breast is negative. Mammographic images were processed with CAD.  On physical exam, I palpate a discrete mass in the right breast at 8 o'clock 1 cm from the nipple.  Ultrasound is performed, showing there is an irregular, hypoechoic mass in the right breast at 8 o'clock 1 cm from the nipple measuring 2.1 x 1.1 x 1.6 cm.  Sonographic evaluation of the right axilla fails to reveal any enlarged adenopathy.  IMPRESSION: Suspicious right breast mass.  Tissue sampling is recommended. Ultrasound-guided core biopsy will be performed and dictated separately.  BI-RADS CATEGORY 4:  Suspicious abnormality - biopsy should be considered.  Original Report Authenticated By: Littie Deeds. Judyann Munson, M.D.   Mm Digital Diagnostic Unilat R  02/23/2012  *RADIOLOGY REPORT*  Clinical Data:  Status post ultrasound-guided core biopsy of a a right breast mass  DIGITAL DIAGNOSTIC RIGHT MAMMOGRAM  Comparison:  None.  Findings:  Films are performed following ultrasound guided biopsy of a mass in the 8 o'clock region of the right breast. Mammographic images demonstrate there is a wing shaped InRad the clip in the mass in the 8 o'clock position of the right breast.  IMPRESSION: Status post ultrasound-guided core biopsy of a right breast mass with pathology pending.  Original Report Authenticated By: Littie Deeds. Judyann Munson, M.D.   Mm Radiologist Eval And Mgmt  02/23/2012  *RADIOLOGY REPORT*  ESTABLISHED PATIENT OFFICE VISIT - LEVEL II [phone])  Chief Complaint:  The patient returns for discussion of the pathologic report after ultrasound-guided core needle biopsy of a mass at 8 o'clock in the right breast.  History:  The patient has felt a tender lump in the right lower outer quadrant for 1 month.   An ill defined solid mass was noted at 8 o'clock upon the imaging of the right breast and ultrasound- guided core needle biopsy was performed on 02/20/2012.  Exam:  On physical examination, the biopsy site demonstrates mild ecchymosis.  There is no sign of hematoma or infection.  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  This is concordant with the imaging findings.  Results were discussed with the patient, her mother and her father.  Breast MRI was scheduled for 02/27/2012. The patient was scheduled to be seen in the [facility]  on 03/03/2012.  Questions were answered. Educational materials were given.  Original Report Authenticated By: Daryl Eastern, M.D.    Assessment and Plan: 36 year old woman who presents with triple-negative breast cancer involving her right breast. We have fairly lengthy discussion regarding treatment options fertility issues as well as genetic testing. Ultimately I think it's in her best interest to consider neoadjuvant therapy given the size of the mass in relation to the size of her breast. I did review her MRI images with her family. I also discussed at the type of chemotherapy of any for staging PET scan, 2-D echo and port placement. She will also have chemotherapy counseling sessions as well. He also discussed possible side effects of chemotherapy. Given her positive family history of ovarian cancer it's important that she had genetic testing done. The purpose of neoadjuvant therapy would then allow her to get information from genetic testing and she couldn't decision regarding surgical options later on. We have tentatively scheduled her to start chemotherapy on 03/15/2012. She received dose dense FEC. Medications have been appropriately distributed.   Pierce Crane M.D., FRCP C.*

## 2012-03-04 ENCOUNTER — Encounter (HOSPITAL_COMMUNITY): Payer: Self-pay | Admitting: Pharmacy Technician

## 2012-03-04 ENCOUNTER — Encounter: Payer: Self-pay | Admitting: *Deleted

## 2012-03-04 ENCOUNTER — Encounter (HOSPITAL_COMMUNITY): Payer: Self-pay | Admitting: *Deleted

## 2012-03-04 ENCOUNTER — Telehealth: Payer: Self-pay | Admitting: *Deleted

## 2012-03-04 NOTE — Progress Notes (Signed)
Mailed original intake form back to patient to keep for her records. 

## 2012-03-04 NOTE — Telephone Encounter (Signed)
per conversation with the patient on 03-04-2012 requested to move her echo and genetic counseling closer together patient confimed over the phone the new date and time

## 2012-03-04 NOTE — Pre-Procedure Instructions (Addendum)
Pt instructed hibiclens shower hs and am of surgery, no shaving for  2 days before showers Cbc, cmet 03-03-12 epic

## 2012-03-08 ENCOUNTER — Other Ambulatory Visit: Payer: BC Managed Care – PPO | Admitting: Lab

## 2012-03-08 ENCOUNTER — Ambulatory Visit (HOSPITAL_BASED_OUTPATIENT_CLINIC_OR_DEPARTMENT_OTHER): Payer: BC Managed Care – PPO | Admitting: Genetic Counselor

## 2012-03-08 ENCOUNTER — Ambulatory Visit (HOSPITAL_COMMUNITY)
Admission: RE | Admit: 2012-03-08 | Discharge: 2012-03-08 | Disposition: A | Discharge status: Home / Self Care | Payer: BC Managed Care – PPO | Source: Ambulatory Visit | Attending: Oncology | Admitting: Oncology

## 2012-03-08 ENCOUNTER — Ambulatory Visit (HOSPITAL_COMMUNITY): Payer: BC Managed Care – PPO

## 2012-03-08 ENCOUNTER — Encounter: Payer: Self-pay | Admitting: *Deleted

## 2012-03-08 DIAGNOSIS — C50519 Malignant neoplasm of lower-outer quadrant of unspecified female breast: Secondary | ICD-10-CM | POA: Insufficient documentation

## 2012-03-08 DIAGNOSIS — IMO0002 Reserved for concepts with insufficient information to code with codable children: Secondary | ICD-10-CM

## 2012-03-08 DIAGNOSIS — Z808 Family history of malignant neoplasm of other organs or systems: Secondary | ICD-10-CM

## 2012-03-08 HISTORY — PX: TRANSTHORACIC ECHOCARDIOGRAM: SHX275

## 2012-03-08 NOTE — Progress Notes (Signed)
Dr.  Donnie Coffin requested a consultation for genetic counseling and risk assessment for Betty [name], a 36 y.o. female, for discussion of her personal history of breast cancer. She presents to clinic today, with her father, Onalee Hua, to discuss the possibility of a genetic predisposition to cancer, and to further clarify her risks, as well as her family members' risks for cancer.   HISTORY OF PRESENT ILLNESS: In April 2013, at the age of 34, Betty [name] was diagnosed with invasive ductal carcinoma of the breast.    Past Medical History  Diagnosis Date  . Allergic rhinitis   . Allergic conjunctivitis     Past Surgical History  Procedure Date  . Other surgical history     right shoulder x 2  . Inguinal hernia repair     as an infant  . Shoulder surgery 1996    x 2- labrum and capsular repair    History  Substance Use Topics  . Smoking status: Never Smoker   . Smokeless tobacco: Never Used  . Alcohol Use: No    REPRODUCTIVE HISTORY AND PERSONAL RISK ASSESSMENT FACTORS: Menarche was at age 79.   Premenopausal Uterus Intact: Yes Ovaries Intact: Yes G1P1A0 , first live birth at age 26  She has not previously undergone treatment for infertility.   OCP use for 18 years   She has not used HRT in the past.    FAMILY HISTORY:  We obtained a detailed, 4-generation family history.  Significant diagnoses are listed below: Family History  Problem Relation Age of Onset  . Allergic rhinitis      grandmother  . Cancer Mother     ovarian  . Cancer Paternal Uncle     leukemia  . Cancer Paternal Uncle     lung cancer  The patient was diagnosed with breast cancer at age 15.  Her mother was diagnosed with ovarian cancer at age 80.  Her mothers maternal uncle was diagnosed with esophogeal cancer, two maternal aunts were diagnosed with lung cancer and a maternal uncle was diagnosed with pancreatic cancer.  This uncle had a daughter with breast cancer at age 87.  A health maternal aunt  had a son with esophogeal cancer at age 79.  The patient's mother's maternal grandmother was diagnosed with pancreatic cancer and her father with prostate cancer.  The patient's mother's paternal aunt was diagnosed with bladder cancer and another paternal aunt with lung cancer.  The patient's paternal uncle was diagnosed with leukemia at age 34, and another uncle with lung cancer at age 56.  This uncle was a smoker and had three tours in [place].  Her paternal grandmother died of lung cancer at age 58.  A double first cousin to the patients' father was diagnosed with lung cancer.  Patient's maternal ancestors are of [place] descent, and paternal ancestors are of [place] and [place] descent. There is no reported Ashkenazi Jewish ancestry. There is known consanguinity - the patient's parent's are 5th cousins (their grandmothers are second cousins).  GENETIC COUNSELING RISK ASSESSMENT, DISCUSSION, AND SUGGESTED FOLLOW UP: We reviewed the natural history and genetic etiology of sporadic, familial and hereditary cancer syndromes. About 5-10% of breast cancer is hereditary and of this, 85% is the result of a BRCA1 or BRCA2 mutation.  We reviewed the red flags of hereditary breast cancer and the dominant inheritance pattern.  We also reviewed the panel tests that are performed by [facility].  Should the patient test negative for a BRCA mutation,  the panel testing could help identify other hereditary syndromes.  The patient's personal history of breast cancer is suggestive of the following possible diagnosis: hereditary breast cancer  We discussed that identification of a hereditary cancer syndrome may help her care providers tailor the patients medical management. If a mutation indicating hereditary breast cancer is detected in this case, the [employer] recommendations would include increased cancer surveillance and possible prophylactic sugery. If a mutation is detected, the  patient will be referred back to the referring provider and to any additional appropriate care providers to discuss the relevant options.   If a mutation is not found in the patient, this will decrease the likelihood of hereditary breast cancer as the explanation for her breast cancer. Cancer surveillance options would be discussed for the patient according to the appropriate standard National Comprehensive Cancer Network and American Cancer Society guidelines, with consideration of their personal and family history risk factors. In this case, the patient will be referred back to their care providers for discussions of management.   In order to estimate her chance of having a BRCA1 or BRCA2 mutation, we used statistical models (Penn II) and laboratory data that take into account her personal medical history, family history and ancestry.  Because each model is different, there can be a lot of variability in the risks they give.  Therefore, these numbers must be considered a rough range and not a precise risk of having a BRCA1 or BRCA2 mutation.  These models estimate that she has approximately a 36% chance of having a mutation. Specifically there is an 18% chance of testing positive for a BRCA1 mutation and an 18% chance of testing positive for a BRCA2 mutation.  Based on this assessment of her family and personal history, genetic testing is recommended.  After considering the risks, benefits, and limitations, the patient provided informed consent for  the following  Testing:  BRCAnalysis through [facility] and Cancer Next panel through [employer]   Per the patient's request, we will contact her by telephone to discuss these results. A follow up genetic counseling visit will be scheduled if indicated.  The patient was seen for a total of 60 minutes, greater than 50% of which was spent face-to-face counseling.  This plan is being carried out per Dr. Renelda Loma recommendations.  This note will also be  sent to the referring provider via the electronic medical record. The patient will be supplied with a summary of this genetic counseling discussion as well as educational information on the discussed hereditary cancer syndromes following the conclusion of their visit.   Patient was discussed with Dr. Drue Second.  EDUCATIONAL INFORMATION SUPPLIED TO PATIENT AT ENCOUNTER:  HEreditary breast and ovarian cancer syndrome brochure  _______________________________________________________________________ For Office Staff:  Number of people involved in session: 4 Was an Intern/ student involved with case: yes

## 2012-03-08 NOTE — Progress Notes (Signed)
Mailed after appt letter to pt. 

## 2012-03-08 NOTE — Progress Notes (Signed)
*  PRELIMINARY RESULTS* Echocardiogram 2D Echocardiogram has been performed.  Aseel Uhde R 03/08/2012, 2:57 PM 

## 2012-03-09 ENCOUNTER — Encounter: Payer: Self-pay | Admitting: *Deleted

## 2012-03-09 ENCOUNTER — Encounter (HOSPITAL_COMMUNITY)
Admission: RE | Admit: 2012-03-09 | Discharge: 2012-03-09 | Disposition: A | Discharge status: Home / Self Care | Payer: BC Managed Care – PPO | Source: Ambulatory Visit | Attending: Oncology | Admitting: Oncology

## 2012-03-09 ENCOUNTER — Other Ambulatory Visit: Payer: Self-pay | Admitting: *Deleted

## 2012-03-09 ENCOUNTER — Telehealth: Payer: Self-pay | Admitting: *Deleted

## 2012-03-09 ENCOUNTER — Other Ambulatory Visit: Payer: BC Managed Care – PPO

## 2012-03-09 DIAGNOSIS — J3489 Other specified disorders of nose and nasal sinuses: Secondary | ICD-10-CM | POA: Insufficient documentation

## 2012-03-09 DIAGNOSIS — C50919 Malignant neoplasm of unspecified site of unspecified female breast: Secondary | ICD-10-CM | POA: Insufficient documentation

## 2012-03-09 DIAGNOSIS — C50519 Malignant neoplasm of lower-outer quadrant of unspecified female breast: Secondary | ICD-10-CM

## 2012-03-09 MED ORDER — FLUDEOXYGLUCOSE F - 18 (FDG) INJECTION
18.0000 | Freq: Once | INTRAVENOUS | Status: AC | PRN
  Administered 2012-03-09: 18 via INTRAVENOUS

## 2012-03-09 NOTE — Telephone Encounter (Signed)
gave patient appointment for 03-15-2012 per dawn patient has been notified of the appointment

## 2012-03-09 NOTE — Telephone Encounter (Signed)
Spoke to pt concerning BMDC from 5/1.  Gave pt f/u appt with Debbora Presto, PA for 5/13 at 10:15 and 1st chemo at 11:00.  Pt denies questions regarding dx or treatment care plan.  Talked with pt about hair loss and general s/e from chemo.  Informed pt that she will talk about this information in chemo class tonight.  Confirmed appt for port placement on 5/8.  Pt relate she has all premeds.  Encourage pt to call with needs. Received verbal understanding.  Contact information given.

## 2012-03-10 ENCOUNTER — Encounter (HOSPITAL_COMMUNITY)
Admission: RE | Disposition: A | Discharge status: Home / Self Care | Payer: Self-pay | Source: Ambulatory Visit | Attending: Surgery

## 2012-03-10 ENCOUNTER — Ambulatory Visit (HOSPITAL_COMMUNITY): Payer: BC Managed Care – PPO

## 2012-03-10 ENCOUNTER — Encounter (HOSPITAL_COMMUNITY): Payer: Self-pay | Admitting: Anesthesiology

## 2012-03-10 ENCOUNTER — Ambulatory Visit (HOSPITAL_COMMUNITY): Payer: BC Managed Care – PPO | Admitting: Anesthesiology

## 2012-03-10 ENCOUNTER — Encounter (HOSPITAL_COMMUNITY): Payer: Self-pay | Admitting: *Deleted

## 2012-03-10 ENCOUNTER — Ambulatory Visit (HOSPITAL_COMMUNITY)
Admission: RE | Admit: 2012-03-10 | Discharge: 2012-03-10 | Disposition: A | Discharge status: Home / Self Care | Payer: BC Managed Care – PPO | Source: Ambulatory Visit | Attending: Surgery | Admitting: Surgery

## 2012-03-10 DIAGNOSIS — C50919 Malignant neoplasm of unspecified site of unspecified female breast: Secondary | ICD-10-CM

## 2012-03-10 DIAGNOSIS — Z79899 Other long term (current) drug therapy: Secondary | ICD-10-CM | POA: Insufficient documentation

## 2012-03-10 DIAGNOSIS — Z95828 Presence of other vascular implants and grafts: Secondary | ICD-10-CM

## 2012-03-10 DIAGNOSIS — J309 Allergic rhinitis, unspecified: Secondary | ICD-10-CM | POA: Insufficient documentation

## 2012-03-10 HISTORY — PX: PORTACATH PLACEMENT: SHX2246

## 2012-03-10 LAB — HCG, SERUM, QUALITATIVE: Preg, Serum: NEGATIVE

## 2012-03-10 LAB — SURGICAL PCR SCREEN: Staphylococcus aureus: NEGATIVE

## 2012-03-10 SURGERY — INSERTION, TUNNELED CENTRAL VENOUS DEVICE, WITH PORT
Anesthesia: Monitor Anesthesia Care | Wound class: Clean

## 2012-03-10 MED ORDER — SODIUM CHLORIDE 0.9 % IR SOLN
Status: DC | PRN
  Administered 2012-03-10: 1000 mL

## 2012-03-10 MED ORDER — ONDANSETRON HCL 4 MG/2ML IJ SOLN
INTRAMUSCULAR | Status: DC | PRN
  Administered 2012-03-10: 4 mg via INTRAVENOUS

## 2012-03-10 MED ORDER — ACETAMINOPHEN 500 MG PO TABS
1000.0000 mg | ORAL_TABLET | ORAL | Status: DC | PRN
  Administered 2012-03-10: 975 mg via ORAL
  Filled 2012-03-10: qty 2

## 2012-03-10 MED ORDER — CEFAZOLIN SODIUM 1-5 GM-% IV SOLN
INTRAVENOUS | Status: AC
  Filled 2012-03-10: qty 50

## 2012-03-10 MED ORDER — SODIUM CHLORIDE 0.9 % IR SOLN
Status: DC | PRN
  Administered 2012-03-10: 11:00:00

## 2012-03-10 MED ORDER — HEPARIN SOD (PORK) LOCK FLUSH 100 UNIT/ML IV SOLN
INTRAVENOUS | Status: AC
  Filled 2012-03-10: qty 5

## 2012-03-10 MED ORDER — FENTANYL CITRATE 0.05 MG/ML IJ SOLN: 25.0000 ug | INTRAMUSCULAR | Status: DC | PRN

## 2012-03-10 MED ORDER — LIDOCAINE-PRILOCAINE 2.5-2.5 % EX CREA: TOPICAL_CREAM | CUTANEOUS | Status: DC | PRN

## 2012-03-10 MED ORDER — LACTATED RINGERS IV SOLN: INTRAVENOUS | Status: DC

## 2012-03-10 MED ORDER — LACTATED RINGERS IV SOLN
INTRAVENOUS | Status: DC | PRN
  Administered 2012-03-10 (×2): via INTRAVENOUS

## 2012-03-10 MED ORDER — BUPIVACAINE HCL (PF) 0.5 % IJ SOLN
INTRAMUSCULAR | Status: DC | PRN
  Administered 2012-03-10: 10 mL

## 2012-03-10 MED ORDER — MUPIROCIN 2 % EX OINT
TOPICAL_OINTMENT | CUTANEOUS | Status: AC
  Filled 2012-03-10: qty 22

## 2012-03-10 MED ORDER — CEFAZOLIN SODIUM 1-5 GM-% IV SOLN
1.0000 g | INTRAVENOUS | Status: AC
  Administered 2012-03-10: 1 g via INTRAVENOUS

## 2012-03-10 MED ORDER — PROMETHAZINE HCL 25 MG/ML IJ SOLN: 6.2500 mg | INTRAMUSCULAR | Status: DC | PRN

## 2012-03-10 MED ORDER — FENTANYL CITRATE 0.05 MG/ML IJ SOLN
INTRAMUSCULAR | Status: DC | PRN
  Administered 2012-03-10: 100 ug via INTRAVENOUS

## 2012-03-10 MED ORDER — HEPARIN SOD (PORK) LOCK FLUSH 100 UNIT/ML IV SOLN
INTRAVENOUS | Status: DC | PRN
  Administered 2012-03-10: 500 [IU] via INTRAVENOUS

## 2012-03-10 MED ORDER — HYDROCODONE-ACETAMINOPHEN 5-325 MG PO TABS: 1.0000 | ORAL_TABLET | ORAL | Status: AC | PRN

## 2012-03-10 MED ORDER — MIDAZOLAM HCL 5 MG/5ML IJ SOLN
INTRAMUSCULAR | Status: DC | PRN
  Administered 2012-03-10: 2 mg via INTRAVENOUS

## 2012-03-10 MED ORDER — ACETAMINOPHEN 325 MG PO TABS
ORAL_TABLET | ORAL | Status: AC
  Filled 2012-03-10: qty 3

## 2012-03-10 MED ORDER — LACTATED RINGERS IV SOLN
INTRAVENOUS | Status: DC
  Administered 2012-03-10: 1000 mL via INTRAVENOUS

## 2012-03-10 MED ORDER — SODIUM CHLORIDE 0.9 % IR SOLN
Status: DC
  Filled 2012-03-10: qty 1.2

## 2012-03-10 MED ORDER — BUPIVACAINE HCL (PF) 0.5 % IJ SOLN
INTRAMUSCULAR | Status: AC
  Filled 2012-03-10: qty 30

## 2012-03-10 MED ORDER — PROPOFOL 10 MG/ML IV EMUL
INTRAVENOUS | Status: DC | PRN
  Administered 2012-03-10: 120 ug/kg/min via INTRAVENOUS

## 2012-03-10 SURGICAL SUPPLY — 44 items
ADH SKN CLS APL DERMABOND .7 (GAUZE/BANDAGES/DRESSINGS)
APL SKNCLS STERI-STRIP NONHPOA (GAUZE/BANDAGES/DRESSINGS) ×1
BAG DECANTER FOR FLEXI CONT (MISCELLANEOUS) ×2 IMPLANT
BENZOIN TINCTURE PRP APPL 2/3 (GAUZE/BANDAGES/DRESSINGS) ×1 IMPLANT
BLADE HEX COATED 2.75 (ELECTRODE) ×2 IMPLANT
BLADE SURG 15 STRL LF DISP TIS (BLADE) ×1 IMPLANT
BLADE SURG 15 STRL SS (BLADE) ×2
BLADE SURG SZ11 CARB STEEL (BLADE) ×2 IMPLANT
CLOTH BEACON ORANGE TIMEOUT ST (SAFETY) ×2 IMPLANT
DECANTER SPIKE VIAL GLASS SM (MISCELLANEOUS) ×2 IMPLANT
DERMABOND ADVANCED (GAUZE/BANDAGES/DRESSINGS)
DERMABOND ADVANCED .7 DNX12 (GAUZE/BANDAGES/DRESSINGS) IMPLANT
DRAPE C-ARM 42X72 X-RAY (DRAPES) ×2 IMPLANT
DRAPE LAPAROSCOPIC ABDOMINAL (DRAPES) ×2 IMPLANT
DRSG TEGADERM 2-3/8X2-3/4 SM (GAUZE/BANDAGES/DRESSINGS) ×1 IMPLANT
DRSG TEGADERM 4X4.75 (GAUZE/BANDAGES/DRESSINGS) IMPLANT
ELECT REM PT RETURN 9FT ADLT (ELECTROSURGICAL) ×2
ELECTRODE REM PT RTRN 9FT ADLT (ELECTROSURGICAL) ×1 IMPLANT
GAUZE SPONGE 4X4 16PLY XRAY LF (GAUZE/BANDAGES/DRESSINGS) ×2 IMPLANT
GLOVE BIO SURGEON STRL SZ7 (GLOVE) ×4 IMPLANT
GLOVE BIOGEL PI IND STRL 7.0 (GLOVE) ×1 IMPLANT
GLOVE BIOGEL PI INDICATOR 7.0 (GLOVE) ×1
GLOVE SURG SIGNA 7.5 PF LTX (GLOVE) ×2 IMPLANT
GOWN STRL NON-REIN LRG LVL3 (GOWN DISPOSABLE) ×2 IMPLANT
GOWN STRL REIN XL XLG (GOWN DISPOSABLE) ×3 IMPLANT
KIT BASIN OR (CUSTOM PROCEDURE TRAY) ×2 IMPLANT
KIT PORT POWER ISP 8FR (Catheter) ×1 IMPLANT
KIT POWER CATH 8FR (Catheter) IMPLANT
NDL HYPO 25X1 1.5 SAFETY (NEEDLE) ×1 IMPLANT
NEEDLE HYPO 25X1 1.5 SAFETY (NEEDLE) ×2 IMPLANT
NS IRRIG 1000ML POUR BTL (IV SOLUTION) ×2 IMPLANT
PACK BASIC VI WITH GOWN DISP (CUSTOM PROCEDURE TRAY) ×2 IMPLANT
PENCIL BUTTON HOLSTER BLD 10FT (ELECTRODE) ×2 IMPLANT
SPONGE GAUZE 4X4 12PLY (GAUZE/BANDAGES/DRESSINGS) ×1 IMPLANT
STRIP CLOSURE SKIN 1/2X4 (GAUZE/BANDAGES/DRESSINGS) ×1 IMPLANT
SUT MNCRL AB 4-0 PS2 18 (SUTURE) ×2 IMPLANT
SUT PROLENE 2 0 SH DA (SUTURE) ×2 IMPLANT
SUT SILK 2 0 (SUTURE)
SUT SILK 2-0 30XBRD TIE 12 (SUTURE) IMPLANT
SUT VIC AB 3-0 SH 27 (SUTURE) ×2
SUT VIC AB 3-0 SH 27XBRD (SUTURE) ×1 IMPLANT
SYR CONTROL 10ML LL (SYRINGE) ×2 IMPLANT
SYRINGE 10CC LL (SYRINGE) ×2 IMPLANT
TOWEL OR 17X26 10 PK STRL BLUE (TOWEL DISPOSABLE) ×3 IMPLANT

## 2012-03-10 NOTE — Op Note (Signed)
INSERTION PORT-A-CATH  Procedure Note  Betty [name] 03/10/2012   Pre-op Diagnosis: right breast cancer     Post-op Diagnosis: same  Procedure(s): INSERTION PORT-A-CATH LEFT SUBCLAVIAN VEIN (8 FR.)  Surgeon(s): Shelly Rubenstein, MD  Anesthesia: Monitor Anesthesia Care  Staff:  Betty [name] - Circulator Michaela Janae Bridgeman, CST - Scrub Person Therese Sarah, RN - Relief Circulator  Estimated Blood Loss: Minimal               Procedure: The patient was brought to the operating room and identified as the correct patient. She was placed supine on the operating room table and anesthesia was induced. Her left chest and neck were prepped and draped in the usual sterile fashion. The patient was placed in Trendelenburg position. The chest and the left clavicle was anesthetized with lidocaine. I then used the introducer needle to easily cannulate the left subclavian vein. A wire was passed through the needle into the central venous system under direct fluoroscopy. The needle was then removed. I anesthetized the skin for lidocaine and made a transverse incision incorporating the introduction site.  I then created a pocket with the electrocautery. An 8 French port was brought to the field. It was flushed properly. I connected the catheter to the port and secured in place. I think that it at appropriate length. The dilator and introducer sheath were then passed over the wire into the central venous system. I removed the wire and dilator. I placed the port into the pocket. I then fed the catheter down the sheath. The sheath was then peeled away leaving the catheter in the central venous system. I had to manipulate the catheter several times in order to get it to go into the superior vena cava. I then accessed the port and good flush and return demonstrated. I then instilled concentrated heparin solution into the port. I then secured the port to the chest wall with interrupted 3-0 Prolene  sutures. I then closed the subcutaneous tissue with interrupted 3-0 Vicryl sutures and closed the skin with a running 4-0 Monocryl. Steri-Strips, gauze, and Tegaderm were then applied. The patient tolerated the procedure well. All the counts were correct at the end of the procedure. The patient was taken in stable condition to the recovery room.          Tanara Turvey A   Date: 03/10/2012  Time: 11:43 AM

## 2012-03-10 NOTE — Anesthesia Preprocedure Evaluation (Addendum)
Anesthesia Evaluation  Patient identified by MRN, date of birth, ID band Patient awake    Reviewed: Allergy & Precautions, H&P , NPO status , Patient's Chart, lab work & pertinent test results  History of Anesthesia Complications Negative for: history of anesthetic complications  Airway Mallampati: I TM Distance: >3 FB Neck ROM: Full    Dental  (+) Teeth Intact and Dental Advisory Given   Pulmonary neg pulmonary ROS,  breath sounds clear to auscultation  Pulmonary exam normal       Cardiovascular negative cardio ROS  Rhythm:Regular Rate:Normal     Neuro/Psych negative neurological ROS  negative psych ROS   GI/Hepatic negative GI ROS, Neg liver ROS,   Endo/Other  negative endocrine ROS  Renal/GU negative Renal ROS  negative genitourinary   Musculoskeletal negative musculoskeletal ROS (+)   Abdominal   Peds  Hematology negative hematology ROS (+)   Anesthesia Other Findings   Reproductive/Obstetrics negative OB ROS                           Anesthesia Physical Anesthesia Plan  ASA: II  Anesthesia Plan: MAC   Post-op Pain Management:    Induction: Intravenous  Airway Management Planned: Nasal Cannula  Additional Equipment:   Intra-op Plan:   Post-operative Plan:   Informed Consent: I have reviewed the patients History and Physical, chart, labs and discussed the procedure including the risks, benefits and alternatives for the proposed anesthesia with the patient or authorized representative who has indicated his/her understanding and acceptance.   Dental advisory given  Plan Discussed with: CRNA  Anesthesia Plan Comments:         Anesthesia Quick Evaluation

## 2012-03-10 NOTE — Discharge Instructions (Signed)
May shower today with bandage on  Remove bandage Friday  Leave steri-strips on about a week Implanted Port Instructions An implanted port is a central line that has a round shape and is placed under the skin. It is used for long-term IV (intravenous) access for:  Medicine.   Fluids.   Liquid nutrition, such as TPN (total parenteral nutrition).   Blood samples.  Ports can be placed:  In the chest area just below the collarbone (this is the most common place.)   In the arms.   In the belly (abdomen) area.   In the legs.  PARTS OF THE PORT A port has 2 main parts:  The reservoir. The reservoir is round, disc-shaped, and will be a small, raised area under your skin.   The reservoir is the part where a needle is inserted (accessed) to either give medicines or to draw blood.   The catheter. The catheter is a long, slender tube that extends from the reservoir. The catheter is placed into a large vein.   Medicine that is inserted into the reservoir goes into the catheter and then into the vein.  INSERTION OF THE PORT  The port is surgically placed in either an operating room or in a procedural area (interventional radiology).   Medicine may be given to help you relax during the procedure.   The skin where the port will be inserted is numbed (local anesthetic).   1 or 2 small cuts (incisions) will be made in the skin to insert the port.   The port can be used after it has been inserted.  INCISION SITE CARE  The incision site may have small adhesive strips on it. This helps keep the incision site closed. Sometimes, no adhesive strips are placed. Instead of adhesive strips, a special kind of surgical glue is used to keep the incision closed.   If adhesive strips were placed on the incision sites, do not take them off. They will fall off on their own.   The incision site may be sore for 1 to 2 days. Pain medicine can help.   Do not get the incision site wet. Bathe or shower  as directed by your caregiver.   The incision site should heal in 5 to 7 days. A small scar may form after the incision has healed.  ACCESSING THE PORT Special steps must be taken to access the port:  Before the port is accessed, a numbing cream can be placed on the skin. This helps numb the skin over the port site.   A sterile technique is used to access the port.   The port is accessed with a needle. Only "non-coring" port needles should be used to access the port. Once the port is accessed, a blood return should be checked. This helps ensure the port is in the vein and is not clogged (clotted).   If your caregiver believes your port should remain accessed, a clear (transparent) bandage will be placed over the needle site. The bandage and needle will need to be changed every week or as directed by your caregiver.   Keep the bandage covering the needle clean and dry. Do not get it wet. Follow your caregiver's instructions on how to take a shower or bath when the port is accessed.   If your port does not need to stay accessed, no bandage is needed over the port.  FLUSHING THE PORT Flushing the port keeps it from getting clogged. How often the port is  flushed depends on:  If a constant infusion is running. If a constant infusion is running, the port may not need to be flushed.   If intermittent medicines are given.   If the port is not being used.  For intermittent medicines:  The port will need to be flushed:   After medicines have been given.   After blood has been drawn.   As part of routine maintenance.   A port is normally flushed with:   Normal saline.   Heparin.   Follow your caregiver's advice on how often, how much, and the type of flush to use on your port.  IMPORTANT PORT INFORMATION  Tell your caregiver if you are allergic to heparin.   After your port is placed, you will get a manufacturer's information card. The card has information about your port. Keep  this card with you at all times.   There are many types of ports available. Know what kind of port you have.   In case of an emergency, it may be helpful to wear a medical alert bracelet. This can help alert health care workers that you have a port.   The port can stay in for as long as your caregiver believes it is necessary.   When it is time for the port to come out, surgery will be done to remove it. The surgery will be similar to how the port was put in.   If you are in the hospital or clinic:   Your port will be taken care of and flushed by a nurse.   If you are at home:   A home health care nurse may give medicines and take care of the port.   You or a family member can get special training and directions for giving medicine and taking care of the port at home.  SEEK IMMEDIATE MEDICAL CARE IF:   Your port does not flush or you are unable to get a blood return.   New drainage or pus is coming from the incision.   A bad smell is coming from the incision site.   You develop swelling or increased redness at the incision site.   You develop increased swelling or pain at the port site.   You develop swelling or pain in the surrounding skin near the port.   You have an oral temperature above 102 F (38.9 C), not controlled by medicine.  MAKE SURE YOU:   Understand these instructions.   Will watch your condition.   Will get help right away if you are not doing well or get worse.  Document Released: 10/20/2005 Document Revised: 10/09/2011 Document Reviewed: 01/11/2009 [facility] Patient Information 2012 [place], [place].

## 2012-03-10 NOTE — Anesthesia Postprocedure Evaluation (Signed)
Anesthesia Post Note  Patient: Betty [name]  Procedure(s) Performed: Procedure(s) (LRB): INSERTION PORT-A-CATH (N/A)  Anesthesia type: General  Patient location: PACU  Post pain: Pain level controlled  Post assessment: Post-op Vital signs reviewed  Last Vitals:  Filed Vitals:   03/10/12 0809  BP: 129/86  Pulse: 74  Temp: 36.3 C  Resp: 18    Post vital signs: Reviewed  Level of consciousness: sedated  Complications: No apparent anesthesia complications

## 2012-03-10 NOTE — Transfer of Care (Signed)
Immediate Anesthesia Transfer of Care Note  Patient: Betty [name]  Procedure(s) Performed: Procedure(s) (LRB): INSERTION PORT-A-CATH (N/A)  Patient Location: PACU  Anesthesia Type: MAC  Level of Consciousness: awake, alert , oriented, patient cooperative and responds to stimulation  Airway & Oxygen Therapy: Patient Spontanous Breathing and Patient connected to face mask oxygen  Post-op Assessment: Report given to PACU RN, Post -op Vital signs reviewed and stable and Patient moving all extremities X 4  Post vital signs: Reviewed and stable  Complications: No apparent anesthesia complications

## 2012-03-10 NOTE — Preoperative (Signed)
Beta Blockers   Reason not to administer Beta Blockers:Not Applicable 

## 2012-03-10 NOTE — Progress Notes (Signed)
Left chest PAC site with 2x2 under tegaderm saturated with sero sang drainage. Changes dressing with sterile technique. No oozing noted and replaced with sterile 2x2 and tegaderm. Steri strips intact. Ice pac replaced on left shoulder

## 2012-03-10 NOTE — H&P (View-Only) (Signed)
Patient ID: Betty [name], female   DOB: 06/16/1976, 36 y.o.   MRN: [id number]  Chief Complaint  Patient presents with  . Other    right breast cancer    HPI Betty [name] is a 36 y.o. female.   HPI  She is referred by Dr. Hamrick after finding a palpable right breast mass. It is been biopsied and cancer that confirmed. She has had no previous problems with her breast. She denies nipple discharge. Her family history is positive for ovarian cancer. She is otherwise healthy and without complaints.  Past Medical History  Diagnosis Date  . Allergic rhinitis   . Allergic conjunctivitis     Past Surgical History  Procedure Date  . Other surgical history     right shoulder x 2  . Inguinal hernia repair     as an infant  . Shoulder surgery     x 2- labrum and capsular repair    Family History  Problem Relation Age of Onset  . Allergic rhinitis      grandmother  . Cancer Mother     ovarian  . Cancer Paternal Uncle     leukemia  . Cancer Paternal Uncle     lung cancer    Social History History  Substance Use Topics  . Smoking status: Never Smoker   . Smokeless tobacco: Not on file  . Alcohol Use: Not on file    Allergies  Allergen Reactions  . Dextromethorphan     Current Outpatient Prescriptions  Medication Sig Dispense Refill  . EPINEPHrine (EPIPEN) 0.3 mg/0.3 mL DEVI Inject 0.3 mg into the muscle once.        . loratadine (CLARITIN) 10 MG tablet Take 10 mg by mouth daily.        . Norethin Ace-Eth Estrad-FE (LOESTRIN 24 FE PO) Take by mouth daily.          Review of Systems Review of Systems  Constitutional: Negative for fever, chills and unexpected weight change.  HENT: Negative for hearing loss, congestion, sore throat, trouble swallowing and voice change.   Eyes: Negative for visual disturbance.  Respiratory: Negative for cough and wheezing.   Cardiovascular: Negative for chest pain, palpitations and leg swelling.  Gastrointestinal: Negative for  nausea, vomiting, abdominal pain, diarrhea, constipation, blood in stool, abdominal distention and anal bleeding.  Genitourinary: Negative for hematuria, vaginal bleeding and difficulty urinating.  Musculoskeletal: Negative for arthralgias.  Skin: Negative for rash and wound.  Neurological: Negative for seizures, syncope and headaches.  Hematological: Negative for adenopathy. Does not bruise/bleed easily.  Psychiatric/Behavioral: Negative for confusion.    Blood pressure 140/84, pulse 76, temperature 98 F (36.7 C), resp. rate 16.  Physical Exam Physical Exam  Constitutional: She is oriented to person, place, and time. She appears well-developed and well-nourished. No distress.  HENT:  Head: Normocephalic and atraumatic.  Right Ear: External ear normal.  Left Ear: External ear normal.  Nose: Nose normal.  Mouth/Throat: Oropharynx is clear and moist. No oropharyngeal exudate.  Eyes: Conjunctivae are normal. Pupils are equal, round, and reactive to light. Right eye exhibits no discharge. Left eye exhibits no discharge. No scleral icterus.  Neck: Normal range of motion. Neck supple. No tracheal deviation present. No thyromegaly present.  Cardiovascular: Normal rate, regular rhythm, normal heart sounds and intact distal pulses.   No murmur heard. Pulmonary/Chest: Effort normal and breath sounds normal. No respiratory distress. She has no wheezes.  Abdominal: Soft. Bowel sounds are normal.  Musculoskeletal: Normal   range of motion. She exhibits edema. She exhibits no tenderness.  Lymphadenopathy:    She has no cervical adenopathy.    She has no axillary adenopathy.  Neurological: She is alert and oriented to person, place, and time.  Skin: Skin is warm and dry. No rash noted. She is not diaphoretic. No erythema.  Psychiatric: Her behavior is normal. Judgment normal.  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 Port-A-Cath insertion has been requested. I discussed this with her family in detail. I discussed the risk of the procedure which include but not limited to bleeding, infection, pneumothorax, etc. She understands and wishes to proceed. Surgery will be scheduled for as soon as possible.  Likelihood of success is good.       Beckham Buxbaum A 03/03/2012, 10:07 AM    

## 2012-03-10 NOTE — Interval H&P Note (Signed)
History and Physical Interval Note:  She has had no change in her history or exam.  03/10/2012 8:11 AM  Betty [name]  has presented today for surgery, with the diagnosis of right breast cancer  The various methods of treatment have been discussed with the patient and family. After consideration of risks, benefits and other options for treatment, the patient has consented to  Procedure(s) (LRB): INSERTION PORT-A-CATH (N/A) as a surgical intervention .  The patients' history has been reviewed, patient examined, no change in status, stable for surgery.  I have reviewed the patients' chart and labs.  Questions were answered to the patient's satisfaction.  I discussed risks of bleeding, infection, PTX, etc.   [name] A

## 2012-03-12 ENCOUNTER — Encounter (HOSPITAL_COMMUNITY): Payer: Self-pay | Admitting: Surgery

## 2012-03-12 ENCOUNTER — Ambulatory Visit: Payer: BC Managed Care – PPO | Admitting: Internal Medicine

## 2012-03-15 ENCOUNTER — Ambulatory Visit (HOSPITAL_BASED_OUTPATIENT_CLINIC_OR_DEPARTMENT_OTHER): Payer: BC Managed Care – PPO | Admitting: Physician Assistant

## 2012-03-15 ENCOUNTER — Ambulatory Visit (HOSPITAL_BASED_OUTPATIENT_CLINIC_OR_DEPARTMENT_OTHER): Payer: BC Managed Care – PPO

## 2012-03-15 ENCOUNTER — Telehealth: Payer: Self-pay | Admitting: *Deleted

## 2012-03-15 VITALS — BP 150/83 | HR 69 | Temp 97.6°F | Ht 69.0 in | Wt 173.3 lb

## 2012-03-15 DIAGNOSIS — C50519 Malignant neoplasm of lower-outer quadrant of unspecified female breast: Secondary | ICD-10-CM

## 2012-03-15 DIAGNOSIS — Z5111 Encounter for antineoplastic chemotherapy: Secondary | ICD-10-CM

## 2012-03-15 MED ORDER — SODIUM CHLORIDE 0.9 % IJ SOLN
10.0000 mL | INTRAMUSCULAR | Status: DC | PRN
  Administered 2012-03-15: 10 mL
  Filled 2012-03-15: qty 10

## 2012-03-15 MED ORDER — HEPARIN SOD (PORK) LOCK FLUSH 100 UNIT/ML IV SOLN
500.0000 [IU] | Freq: Once | INTRAVENOUS | Status: AC | PRN
  Administered 2012-03-15: 500 [IU]
  Filled 2012-03-15: qty 5

## 2012-03-15 MED ORDER — SODIUM CHLORIDE 0.9 % IV SOLN
500.0000 mg/m2 | Freq: Once | INTRAVENOUS | Status: AC
  Administered 2012-03-15: 980 mg via INTRAVENOUS
  Filled 2012-03-15: qty 49

## 2012-03-15 MED ORDER — LORAZEPAM 2 MG/ML IJ SOLN
0.5000 mg | Freq: Once | INTRAMUSCULAR | Status: AC
  Administered 2012-03-15: 0.5 mg via INTRAVENOUS

## 2012-03-15 MED ORDER — FLUOROURACIL CHEMO INJECTION 2.5 GM/50ML
500.0000 mg/m2 | Freq: Once | INTRAVENOUS | Status: AC
  Administered 2012-03-15: 950 mg via INTRAVENOUS
  Filled 2012-03-15: qty 19

## 2012-03-15 MED ORDER — SODIUM CHLORIDE 0.9 % IV SOLN
Freq: Once | INTRAVENOUS | Status: AC
  Administered 2012-03-15: 12:00:00 via INTRAVENOUS

## 2012-03-15 MED ORDER — SODIUM CHLORIDE 0.9 % IV SOLN
150.0000 mg | Freq: Once | INTRAVENOUS | Status: AC
  Administered 2012-03-15: 150 mg via INTRAVENOUS
  Filled 2012-03-15: qty 5

## 2012-03-15 MED ORDER — EPIRUBICIN HCL CHEMO IV INJECTION 200 MG/100ML
100.0000 mg/m2 | Freq: Once | INTRAVENOUS | Status: AC
  Administered 2012-03-15: 194 mg via INTRAVENOUS
  Filled 2012-03-15: qty 97

## 2012-03-15 MED ORDER — DEXAMETHASONE SODIUM PHOSPHATE 4 MG/ML IJ SOLN
12.0000 mg | Freq: Once | INTRAMUSCULAR | Status: AC
  Administered 2012-03-15: 12 mg via INTRAVENOUS

## 2012-03-15 MED ORDER — PALONOSETRON HCL INJECTION 0.25 MG/5ML
0.2500 mg | Freq: Once | INTRAVENOUS | Status: AC
  Administered 2012-03-15: 0.25 mg via INTRAVENOUS

## 2012-03-15 NOTE — Patient Instructions (Signed)
1.  Pertaining to dexamethasone 4 mg tablets:       -Starting on 03/16/2012, 2 tablets (8 mg) in the a.m. and p.m.       -On 03/17/2012, 8 mg in the a.m. and p.m.       -On 03/18/2012, 8 mg in the a.m. and p.m. Then STOP.  2. Pertaining to ondansetron 8 mg tablets:       -On 03/18/2012, you may start taking 1 in the morning and 1 tablet in the evening for 3 days IF needed.    3. Compazine 10 mg tablets/suppositories may be used at ANY time every 6 hours if needed for nausea.   4.  Lorazepam 0.5 mg tablets may be used every 6-8 hours for nausea, anxiety, or to help you sleep.      If you develop headaches while utilizing any of these medications, you can try Benadryl 25 mg tablets one every 6-8 hours as needed.   If you develop diffuse bony pain associated with Neulasta, we can consider use of Claritin 10 mg tablets daily, ibuprofen 2-400 mg every 6-8 hours with food and water can also be used for short duration.

## 2012-03-15 NOTE — Progress Notes (Signed)
Hematology and Oncology Follow Up Visit  GALI SPINNEY [id number] 12-Jun-1976 36 y.o. 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  Interim History:   Aleighna is seen today with her mother in accompaniment in anticipation of her first of 4 planned neoadjuvant dose dense FEC with Neulasta support on day 2. She had a PowerPort placed on 03/10/2012.  EMLA cream has been placed on the port today. She also underwent staging PET scan on 03/09/2012 which revealed no evidence of distant metastases, 2-D echocardiogram from 03/08/2012 showed a left ventricular ejection fraction between 60 and 65%. She has completed chemotherapy teaching class. Her anti-emetics have been filled, and she has been given patient information pertaining to taking his medications properly. She is feeling well today, denying any unexplained fevers, chills, or night sweats. No nausea, emesis, diarrhea constipation issues. No diffuse bone pain. A detailed review of systems is otherwise noncontributory as noted below.  Review of Systems: Constitutional:  no weight loss, fever, night sweats and feels well Eyes: no complaints ENT: no complaints Cardiovascular: no chest pain or dyspnea on exertion Respiratory: no cough, shortness of breath, or wheezing Neurological: no TIA or stroke symptoms Dermatological: negative Gastrointestinal: no abdominal pain, change in bowel habits, or black or bloody stools Genito-Urinary: no dysuria, trouble voiding, or hematuria Hematological and Lymphatic: negative Breast: negative Musculoskeletal: negative Remaining ROS negative.  Medications:   I have reviewed the patient's current medications.  Current Outpatient Prescriptions  Medication Sig Dispense Refill  . Chlorpheniramine-PSE-Ibuprofen  (ADVIL ALLERGY SINUS PO) Take 1 tablet by mouth as needed. For allergies      . dexamethasone (DECADRON) 4 MG tablet Take 2 tablets by mouth once a day on the day after chemotherapy and then take 2 tablets two times a day for 2 days. Take with food.  30 tablet  1  . EPINEPHrine (EPIPEN) 0.3 mg/0.3 mL DEVI Inject 0.3 mg into the muscle once.        Marland Kitchen HYDROcodone-acetaminophen (NORCO) 5-325 MG per tablet Take 1-2 tablets by mouth every 4 (four) hours as needed for pain.  30 tablet  1  . lidocaine-prilocaine (EMLA) cream Apply topically as needed.  30 g  1  . LORazepam (ATIVAN) 0.5 MG tablet Take 1 tablet (0.5 mg total) by mouth every 6 (six) hours as needed (Nausea or vomiting).  30 tablet  0  . Multiple Vitamin (MULTIVITAMIN) tablet Take 1 tablet by mouth daily. chewable      . ondansetron (ZOFRAN) 8 MG tablet Take 1 tablet two times a day as needed for nausea or vomiting starting on the third day after chemotherapy.  30 tablet  1  . prochlorperazine (COMPAZINE) 10 MG tablet Take 1 tablet (10 mg total) by mouth every 6 (six) hours as needed (Nausea or vomiting).  30 tablet  1  . prochlorperazine (COMPAZINE) 25 MG suppository Place 1 suppository (25 mg total) rectally every 12 (twelve) hours as needed for nausea.  12 suppository  3   No current facility-administered medications for this visit.   Facility-Administered Medications Ordered in Other Visits  Medication Dose Route Frequency Provider Last Rate Last Dose  . lidocaine-prilocaine (EMLA) cream   Topical Once Pierce Crane, MD        Allergies:  Allergies  Allergen Reactions  . Dextromethorphan Rash  Physical Exam: Filed Vitals:   03/15/12 1036  BP: 150/83  Pulse: 69  Temp: 97.6 F (36.4 C)    Body mass index is 25.59 kg/(m^2). Weight: 173 lbs. HEENT:  Sclerae anicteric, conjunctivae pink.  Oropharynx clear.  No mucositis or candidiasis.   Nodes:  No cervical, supraclavicular, or axillary lymphadenopathy palpated.  Breast Exam:   Deferred.   Lungs:  Clear to auscultation bilaterally.  No crackles, rhonchi, or wheezes.   Heart:  Regular rate and rhythm.   Abdomen:  Soft, nontender.  Positive bowel sounds.  No organomegaly or masses palpated.   Musculoskeletal:  No focal spinal tenderness to palpation.  Extremities:  Benign.  No peripheral edema or cyanosis.   Skin:  Benign.   Neuro:  Nonfocal.   Lab Results: Lab Results  Component Value Date   WBC 4.5 03/03/2012   HGB 14.3 03/03/2012   HCT 42.7 03/03/2012   MCV 88.7 03/03/2012   PLT 242 03/03/2012   NEUTROABS 2.3 03/03/2012     Chemistry      Component Value Date/Time   NA 137 03/03/2012 0823   K 3.9 03/03/2012 0823   CL 102 03/03/2012 0823   CO2 26 03/03/2012 0823   BUN 13 03/03/2012 0823   CREATININE 0.89 03/03/2012 0823      Component Value Date/Time   CALCIUM 9.2 03/03/2012 0823   ALKPHOS 44 03/03/2012 0823   AST 23 03/03/2012 0823   ALT 19 03/03/2012 0823   BILITOT 0.4 03/03/2012 0823      Lab Results  Component Value Date   LABCA2 22 03/03/2012   2D Echocardiogram: 03/08/12 Transthoracic Echocardiography Patient: Breyon, Blass MR #: [id number] Study Date: 03/08/2012 Gender: F Age: 16 Height: 152.4cm Weight: 77.3kg BSA: 1.17m^2 Pt. Status: Room:  PERFORMING [facility] Cardiology, Ec ATTENDING Ilda Foil SONOGRAPHER Jeryl Columbia cc:  ------------------------------------------------------------ LV EF: 60% - 65%  ------------------------------------------------------------ Indications: 174.9.  ------------------------------------------------------------ Study Conclusions  Left ventricle: The cavity size was normal. Systolic function was normal. The estimated ejection fraction was in the range of 60% to 65%. Wall motion was normal; there were no regional wall motion abnormalities. Transthoracic echocardiography. M-mode, complete 2D, spectral Doppler, and color Doppler. Height: Height: 152.4cm. Height: 60in. Weight: Weight:  77.3kg. Weight: 170lb. Body mass index: BMI: 33.3kg/m^2. Body surface area: BSA: 1.46m^2. Patient status: Inpatient. Location: Echo laboratory.     Radiological Studies: US Breast Right 02/20/2012  *RADIOLOGY REPORT*  Clinical Data:  Palpable right breast mass in a 36 year old female  DIGITAL DIAGNOSTIC BILATERAL MAMMOGRAM WITH CAD AND RIGHT BREAST ULTRASOUND:  Comparison:  None.  Findings:  There are scattered fibroglandular densities.  Far posteriorly in the lateral aspect of the right breast is an obscured mass with distortion.  The mass is difficult to measure, it is approximately 2.4 cm.  There are no associated malignant-type microcalcifications.  The left breast is negative. Mammographic images were processed with CAD.  On physical exam, I palpate a discrete mass in the right breast at 8 o'clock 1 cm from the nipple.  Ultrasound is performed, showing there is an irregular, hypoechoic mass in the right breast at 8 o'clock 1 cm from the nipple measuring 2.1 x 1.1 x 1.6 cm.  Sonographic evaluation of the right axilla fails to reveal any enlarged adenopathy.  IMPRESSION: Suspicious right breast mass.  Tissue sampling is recommended. Ultrasound-guided core biopsy will be performed and dictated separately.  BI-RADS CATEGORY 4:  Suspicious abnormality - biopsy should be considered.  Original  Report Authenticated By: Littie Deeds. Judyann Munson, M.D.   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  BILATERAL BREAST MRI WITH AND WITHOUT CONTRAST  Technique: Multiplanar, multisequence MR images of both breasts were obtained prior to and following the intravenous administration of 14ml of Multihance.  Three dimensional images were evaluated at the independent DynaCad workstation.  Comparison:  Bilateral mammogram, right breast ultrasound, right breast  ultrasound guided biopsy, and post-biopsy clip mammogram 02/20/2012.  Findings: There is a mild background parenchymal enhancement pattern bilaterally.  In the posterior third of the 8 o'clock position of the right breast is a 2.2 x 1.6 x 1.9 cm irregular mass with heterogeneous enhancment and washout kinetics.  There is hyperemia surrounding the mass.  Biopsy clip artifact is seen within the mass. No additional suspicious mass or enhancement is seen in the right breast.  No mass or suspicious enhancement is identified the left breast to suggest malignancy.  No axillary or internal mammary chain lymphadenopathy is identified.  IMPRESSION:  1.  Solitary 2.2 cm biopsy-proven carcinoma in the 8 o'clock position of the right breast.  2.  No MRI evidence of malignancy in the left breast.  THREE-DIMENSIONAL MR IMAGE RENDERING ON INDEPENDENT WORKSTATION:  Three-dimensional MR images were rendered by post-processing of the original MR data on an independent workstation.  The three- dimensional MR images were interpreted, and findings were reported in the accompanying complete MRI report for this study.  BI-RADS CATEGORY 6:  Known biopsy-proven malignancy - appropriate action should be taken.  Original Report Authenticated By: Britta Mccreedy, M.D.   Nm Pet Image Initial (pi) Skull Base To Thigh 03/09/2012  *RADIOLOGY REPORT*  Clinical Data: Initial treatment strategy for right-sided breast cancer.  Biopsy 02/20/2012.  NUCLEAR MEDICINE PET CT SKULL BASE TO THIGH  Technique:  Technique:  18.0 mCi F-18 FDG was injected intravenously. CT data was obtained and used for attenuation correction and anatomic localization only.  (This was not acquired as a diagnostic CT examination.) Additional exam technical data entered on technologist worksheet.  Comparison: Breast MR 02/27/2012.  Findings: Neck: Nasal pharyngeal hypermetabolism is likely physiologic and is without CT correlate.  Chest:  Right breast primary measures 2.1 cm and a  S.U.V. max of 11.6 on image 101.  No axillary, mediastinal, or hilar nodal hypermetabolism.  Abdomen/Pelvis:  No abnormal hypermetabolism.  [place]:  No evidence of hypermetabolic bone metastasis.  CT images performed for attenuation correction demonstrate complete opacification of the left maxillary sinus.  Partial opacification of the right maxillary sinus.  Mild expansion of the left maxillary sinus.  No significant findings within the chest.  Normal adrenal glands.  IMPRESSION: Right breast primary, without hypermetabolic nodal or distant metastasis.  Sinus disease.  Original Report Authenticated By: Consuello Bossier, M.D.   [place] Core Biopsy 02/20/2012  *RADIOLOGY REPORT*  Clinical Data:  Suspicious right breast mass  ULTRASOUND GUIDED VACUUM ASSISTED CORE BIOPSY OF THE RIGHT BREAST  Comparison: None.  I met with the patient and we discussed the procedure of ultrasound- guided biopsy, including benefits and alternatives.  We discussed the high likelihood of a successful procedure. We discussed the risks of the procedure, including infection, bleeding, tissue injury, clip migration, and inadequate sampling.  Informed, written consent was given.  Using sterile technique, 2% lidocaine, ultrasound guidance, and a 12 gauge vacuum assisted  needle, biopsy was performed of a mass in the 8 o'clock region of the right breast.  At the conclusion of the procedure, a tissue marker clip was deployed into the biopsy cavity.  Follow-up 2-view mammogram was performed and dictated separately.  IMPRESSION: Ultrasound-guided biopsy of the right breast.  No apparent complications.  Original Report Authenticated By: Littie Deeds. Judyann Munson, M.D.   Dg Chest Port 1 View 03/10/2012  *RADIOLOGY REPORT*  Clinical Data: Port-A-Cath placement.  PORTABLE CHEST - 1 VIEW  Comparison: No comparison chest x-ray.  Findings: Left Mediport has been placed with the tip at the level of the proximal superior vena cava.  No gross pneumothorax.  No infiltrate or  congestive heart failure.  No plain film evidence of thoracic metastatic disease.  Heart size within normal limits.  IMPRESSION: Left Mediport has been placed with the tip at the level of the proximal superior vena cava.  No gross pneumothorax.  Original Report Authenticated By: Fuller Canada, M.D.   Mm Digital Diagnostic Bilat 02/20/2012  *RADIOLOGY REPORT*  Clinical Data:  Palpable right breast mass in a 36 year old female  DIGITAL DIAGNOSTIC BILATERAL MAMMOGRAM WITH CAD AND RIGHT BREAST ULTRASOUND:  Comparison:  None.  Findings:  There are scattered fibroglandular densities.  Far posteriorly in the lateral aspect of the right breast is an obscured mass with distortion.  The mass is difficult to measure, it is approximately 2.4 cm.  There are no associated malignant-type microcalcifications.  The left breast is negative. Mammographic images were processed with CAD.  On physical exam, I palpate a discrete mass in the right breast at 8 o'clock 1 cm from the nipple.  Ultrasound is performed, showing there is an irregular, hypoechoic mass in the right breast at 8 o'clock 1 cm from the nipple measuring 2.1 x 1.1 x 1.6 cm.  Sonographic evaluation of the right axilla fails to reveal any enlarged adenopathy.  IMPRESSION: Suspicious right breast mass.  Tissue sampling is recommended. Ultrasound-guided core biopsy will be performed and dictated separately.  BI-RADS CATEGORY 4:  Suspicious abnormality - biopsy should be considered.  Original Report Authenticated By: Littie Deeds. Judyann Munson, M.D.   Mm Digital Diagnostic Unilat R 02/23/2012  *RADIOLOGY REPORT*  Clinical Data:  Status post ultrasound-guided core biopsy of a a right breast mass  DIGITAL DIAGNOSTIC RIGHT MAMMOGRAM  Comparison:  None.  Findings:  Films are performed following ultrasound guided biopsy of a mass in the 8 o'clock region of the right breast. Mammographic images demonstrate there is a wing shaped InRad the clip in the mass in the 8 o'clock position of the  right breast.  IMPRESSION: Status post ultrasound-guided core biopsy of a right breast mass with pathology pending.  Original Report Authenticated By: Littie Deeds. Judyann Munson, M.D.   Mm Radiologist Eval And Mgmt 02/23/2012  *RADIOLOGY REPORT*  ESTABLISHED PATIENT OFFICE VISIT - LEVEL II [phone])  Chief Complaint:  The patient returns for discussion of the pathologic report after ultrasound-guided core needle biopsy of a mass at 8 o'clock in the right breast.  History:  The patient has felt a tender lump in the right lower outer quadrant for 1 month.  An ill defined solid mass was noted at 8 o'clock upon the imaging of the right breast and ultrasound- guided core needle biopsy was performed on 02/20/2012.  Exam:  On physical examination, the biopsy site demonstrates mild ecchymosis.  There is no sign of hematoma or infection.  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  This is concordant with the imaging findings.  Results were discussed with the patient, her mother and her father.  Breast MRI was scheduled for 02/27/2012. The patient was scheduled to be seen in the [facility] on 03/03/2012.  Questions were answered. Educational materials were given.  Original Report Authenticated By: Daryl Eastern, M.D.     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 2. PowerPort placement on 03/10/2012.  Case to be reviewed with Dr Pierce Crane.  Plan:  Adysen will receive treatment today as scheduled, lorazepam 0.5 mg has been a to her premeds. We will try to arrange Neulasta to be given at the [facility], she has been provided information pertaining to proper use of anti-emetics. I will see her in one week's time for  nadir assessment, both she and her mom they to contact us in the interim if the need should arise.  This plan was reviewed with the patient, who voices understanding and agreement.  She knows to call with any changes or problems.    Trudy Kory T, PA-C 03/15/2012

## 2012-03-15 NOTE — Patient Instructions (Signed)
[facility] Discharge Instructions for Patients Receiving Chemotherapy  Today you received the following chemotherapy agents 5 FU/ Ellence/Cytoxan To help prevent nausea and vomiting after your treatment, we encourage you to take your nausea medication as prescribed.  If you develop nausea and vomiting that is not controlled by your nausea medication, call the clinic. If it is after clinic hours your family physician or the after hours number for the clinic or go to the Emergency Department.   BELOW ARE SYMPTOMS THAT SHOULD BE REPORTED IMMEDIATELY:  *FEVER GREATER THAN 100.5 F  *CHILLS WITH OR WITHOUT FEVER  NAUSEA AND VOMITING THAT IS NOT CONTROLLED WITH YOUR NAUSEA MEDICATION  *UNUSUAL SHORTNESS OF BREATH  *UNUSUAL BRUISING OR BLEEDING  TENDERNESS IN MOUTH AND THROAT WITH OR WITHOUT PRESENCE OF ULCERS  *URINARY PROBLEMS  *BOWEL PROBLEMS  UNUSUAL RASH Items with * indicate a potential emergency and should be followed up as soon as possible.  One of the nurses will contact you 24 hours after your treatment. Please let the nurse know about any problems that you may have experienced. Feel free to call the clinic you have any questions or concerns. The clinic phone number is [phone].   I have been informed and understand all the instructions given to me. I know to contact the clinic, my physician, or go to the Emergency Department if any problems should occur. I do not have any questions at this time, but understand that I may call the clinic during office hours   should I have any questions or need assistance in obtaining follow up care.    __________________________________________  _____________  __________ Signature of Patient or Authorized Representative            Date                   Time    __________________________________________ Nurse's Signature    

## 2012-03-15 NOTE — Telephone Encounter (Signed)
Per staff message from Crystal, I have scheduled treatment appts. Appts in computer and Crystal aware.  JMW  

## 2012-03-16 ENCOUNTER — Ambulatory Visit (HOSPITAL_BASED_OUTPATIENT_CLINIC_OR_DEPARTMENT_OTHER): Payer: BC Managed Care – PPO

## 2012-03-16 ENCOUNTER — Encounter: Payer: Self-pay | Admitting: *Deleted

## 2012-03-16 ENCOUNTER — Telehealth: Payer: Self-pay | Admitting: Emergency Medicine

## 2012-03-16 VITALS — BP 138/76 | HR 75 | Temp 97.2°F

## 2012-03-16 DIAGNOSIS — Z5189 Encounter for other specified aftercare: Secondary | ICD-10-CM

## 2012-03-16 DIAGNOSIS — C50519 Malignant neoplasm of lower-outer quadrant of unspecified female breast: Secondary | ICD-10-CM

## 2012-03-16 MED ORDER — PEGFILGRASTIM INJECTION 6 MG/0.6ML
6.0000 mg | Freq: Once | SUBCUTANEOUS | Status: AC
  Administered 2012-03-16: 6 mg via SUBCUTANEOUS
  Filled 2012-03-16: qty 0.6

## 2012-03-17 ENCOUNTER — Encounter: Payer: Self-pay | Admitting: *Deleted

## 2012-03-17 NOTE — Progress Notes (Signed)
RECEIVED CALL FROM PT WHO STATES THAT THE DECADRON THAT SHE HAS BEEN TAKEN POST CHEMO MAKES HER "FEEL BAD" FLUSHED FACE AND "JITTERY" PT TOOK BENADRYL AND "IT HELPED THE RACING HEART BUT MAKES ME SLEEPY" PT HAS BEEN INSTRUCTED BY CS TO TAKE DEX 4MG  2 TABS BID POST 3 DAYS OF CHEMO. PT HAS TAKEN HER MORNING DOSE TODAY BUT HAS BEEN INSTRUCTED TO TAKE (1) 4MG  TAB OF DEX TONIGHT AND (1) 4MG  TAB TWICE DAILY TOMRROW. SHE WILL BE FINISHED W/ POST CHEMO REGIMEN OF DEX. THIS NOTE WILL BE REVIEWED BY CS.  PER CS, PT IS TO TAKE DEX 4MG  TONIGHT AND DEX 4MG  TOMRROW AM AND SKIP THE PM DOSE. PT HAS BEEN NOTIFIED VIA VM

## 2012-03-18 ENCOUNTER — Telehealth: Payer: Self-pay | Admitting: *Deleted

## 2012-03-18 ENCOUNTER — Encounter: Payer: Self-pay | Admitting: *Deleted

## 2012-03-18 NOTE — Telephone Encounter (Signed)
PT LMOVM THAT AS SUGGESTED 03/17/12, SHE HAS CHANGED HER DOSE OF DEX PER CS, AND IS FEELING BUT BETTER

## 2012-03-19 ENCOUNTER — Telehealth: Payer: Self-pay | Admitting: *Deleted

## 2012-03-19 NOTE — Telephone Encounter (Signed)
Spoke with patient to identify needs/concerns.  Pt. States she is doing well as all of her questions were answered previously.  Pt. knowledgeable of upcoming appointments.  She was encouraged to call with questions.

## 2012-03-22 ENCOUNTER — Encounter: Payer: Self-pay | Admitting: Physician Assistant

## 2012-03-22 ENCOUNTER — Ambulatory Visit (HOSPITAL_BASED_OUTPATIENT_CLINIC_OR_DEPARTMENT_OTHER): Payer: BC Managed Care – PPO | Admitting: Physician Assistant

## 2012-03-22 ENCOUNTER — Other Ambulatory Visit (HOSPITAL_BASED_OUTPATIENT_CLINIC_OR_DEPARTMENT_OTHER): Payer: BC Managed Care – PPO | Admitting: Lab

## 2012-03-22 ENCOUNTER — Ambulatory Visit (HOSPITAL_BASED_OUTPATIENT_CLINIC_OR_DEPARTMENT_OTHER): Payer: BC Managed Care – PPO | Admitting: Genetic Counselor

## 2012-03-22 ENCOUNTER — Encounter: Payer: Self-pay | Admitting: Genetic Counselor

## 2012-03-22 VITALS — BP 120/75 | HR 72 | Temp 97.7°F | Ht 69.0 in | Wt 170.3 lb

## 2012-03-22 DIAGNOSIS — Z171 Estrogen receptor negative status [ER-]: Secondary | ICD-10-CM

## 2012-03-22 DIAGNOSIS — C50519 Malignant neoplasm of lower-outer quadrant of unspecified female breast: Secondary | ICD-10-CM

## 2012-03-22 DIAGNOSIS — Z1501 Genetic susceptibility to malignant neoplasm of breast: Secondary | ICD-10-CM

## 2012-03-22 DIAGNOSIS — D709 Neutropenia, unspecified: Secondary | ICD-10-CM

## 2012-03-22 LAB — COMPREHENSIVE METABOLIC PANEL
ALT: 15 U/L (ref 0–35)
CO2: 27 mEq/L (ref 19–32)
Potassium: 3.6 mEq/L (ref 3.5–5.3)
Sodium: 136 mEq/L (ref 135–145)
Total Bilirubin: 0.5 mg/dL (ref 0.3–1.2)
Total Protein: 6.2 g/dL (ref 6.0–8.3)

## 2012-03-22 LAB — CBC WITH DIFFERENTIAL/PLATELET
Eosinophils Absolute: 0.2 10*3/uL (ref 0.0–0.5)
MONO#: 0.1 10*3/uL (ref 0.1–0.9)
MONO%: 5.8 % (ref 0.0–14.0)
NEUT#: 0.2 10*3/uL — CL (ref 1.5–6.5)
RBC: 4.21 10*6/uL (ref 3.70–5.45)
RDW: 12.7 % (ref 11.2–14.5)
WBC: 1.2 10*3/uL — ABNORMAL LOW (ref 3.9–10.3)
lymph#: 0.7 10*3/uL — ABNORMAL LOW (ref 0.9–3.3)
nRBC: 0 % (ref 0–0)

## 2012-03-22 MED ORDER — CIPROFLOXACIN HCL 500 MG PO TABS: 500.0000 mg | ORAL_TABLET | Freq: Two times a day (BID) | ORAL | Status: DC

## 2012-03-22 NOTE — Patient Instructions (Signed)
1. Due to your WBC being low, which is expected, we will start you on Cipro 500mg  in the AM and PM through 03/26/12.  2. Check your temps please, again call for temp >100.5  3. Pertaining to Decadron for the next cycle, you will take 4mg  instead of 8mg  in the AM and PM for the 2 days after chemo, them 4mg  in the AM on day 3.

## 2012-03-22 NOTE — Progress Notes (Signed)
Betty [name], a 36 y.o. female, came in for discussion of her BRCA1 mutation. She presents to clinic today, with her cousin Victorino Dike, to discuss the BRCA result and to learn more about both her and her family members risk for cancer.  HISTORY OF PRESENT ILLNESS:     Past Medical History  Diagnosis Date  . Allergic rhinitis   . Allergic conjunctivitis   . BRCA1 positive 03/22/2012    Past Surgical History  Procedure Date  . Other surgical history     right shoulder x 2  . Inguinal hernia repair     as an infant  . Shoulder surgery 1996    x 2- labrum and capsular repair  . Portacath placement 03/10/2012    Procedure: INSERTION PORT-A-CATH;  Surgeon: Shelly Rubenstein, MD;  Location: [facility];  Service: General;  Laterality: N/A;    History  Substance Use Topics  . Smoking status: Never Smoker   . Smokeless tobacco: Never Used  . Alcohol Use: No    FAMILY HISTORY:  We obtained a detailed, 4-generation family history.  Significant diagnoses are listed below: Family History  Problem Relation Age of Onset  . Allergic rhinitis      grandmother  . Cancer Mother     ovarian  . Cancer Paternal Uncle     leukemia  . Cancer Paternal Uncle     lung cancer  Family history has not changed since we last saw her.  The patient was diagnosed with breast cancer at age 65. Her mother was diagnosed with ovarian cancer at age 60. Her mothers maternal uncle was diagnosed with esophogeal cancer, two maternal aunts were diagnosed with lung cancer and a maternal uncle was diagnosed with pancreatic cancer. This uncle had a daughter with breast cancer at age 24. A healthy maternal aunt had a son with esophogeal cancer at age 81. The patient's mother's maternal grandmother was diagnosed with pancreatic cancer and her father with prostate cancer. The patient's mother's paternal aunt was diagnosed with bladder cancer and another paternal aunt with lung cancer.   The patient's paternal uncle was  diagnosed with leukemia at age 24, and another uncle with lung cancer at age 41. This uncle was a smoker and had three tours in [place]. Her paternal grandmother died of lung cancer at age 61. A double first cousin to the patients' father was diagnosed with lung cancer.   Patient's maternal ancestors are of [place] descent, and paternal ancestors are of [place] and [place] descent. There is no reported Ashkenazi Jewish ancestry. There is known consanguinity - the patient's parent's are 5th cousins (their grandmothers are second cousins).  GENETIC COUNSELING RISK ASSESSMENT, DISCUSSION, AND SUGGESTED FOLLOW UP: We reviewed the family history, and dominant inheritance of BRCA1 mutations. We discussed that this mutation is most likely the cause of the breast, ovarian, and pancreatic cancer in her family members and maternal relatives. We reviewed the 50% risk for having this mutation in her sister and son, and discussed the individuals who should strongly consider getting tested. This includes her two living sisters, her nieces and nephews of her deceased sisters, her children, and the children of her daughter who died of breast cancer. We also discussed getting in touch with her maternal uncle, maternal cousins, and mother's maternal cousins as they would be at risk for this mutation as well. Several copies of her test result were provided to give to her family members. Many relatives live in town, however others are  located around the state or in different states.  She is seeing Debbora Presto, PA, Dr. Renelda Loma PA, today.  Thayer Ohm was told of this mutation as well.  The patient was seen for a total of 30 minutes, greater than 50% of which was spent face-to-face counseling.  This plan is being carried out per Dr. Renelda Loma recommendations.  This note will also be sent to the referring provider via the electronic medical record. The patient will be supplied with a summary of this genetic counseling discussion as  well as educational information on the discussed hereditary cancer syndromes following the conclusion of their visit.   Patient was discussed with Dr. Drue Second.  EDUCATIONAL INFORMATION SUPPLIED TO PATIENT AT ENCOUNTER:  Table of the type of cancers associated with BRCA1 mutations, NCCN guidelines for screening for that particular cancer, and the risk for developing each type of cancer in individuals with a BRCA1 mutation.   _______________________________________________________________________ For Office Staff:  Number of people involved in session: 4 Was an Intern/ student involved with case: yes

## 2012-03-22 NOTE — Progress Notes (Signed)
Hematology and Oncology Follow Up Visit  Betty [name] [id number] 1975/12/17 36 y.o. 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  Interim History:   Betty [name] is seen today her cousin in accompaniment for followup after her first of 4 planned cycles of neoadjuvant dose dense FEC. Of note, genetic counseling has been performed, she is BRCA1 positive. She has met with our genetic counselor today. She denies any fevers, chills, or night sweats, she did describe a low-grade temp of 100.3 over the weekend, she contacted the on-call physician, which due to the fact that she was feeling well, she took Tylenol and has been afebrile since. She denies any nausea, or emesis issues. No diarrhea or constipation problems. Her energy level is recovering. She denies a diffuse bone pain. Her only complaint was associated with the dexamethasone dosing of a milligrams by mouth twice a day x3 days following treatment, it has been adjusted, and she did much better.  A detailed review of systems is otherwise noncontributory as noted below.  Review of Systems: Constitutional:  no weight loss, fever, night sweats and feels well Eyes: no complaints ENT: no complaints Cardiovascular: no chest pain or dyspnea on exertion Respiratory: no cough, shortness of breath, or wheezing Neurological: no TIA or stroke symptoms Dermatological: negative Gastrointestinal: no abdominal pain, change in bowel habits, or black or bloody stools Genito-Urinary: no dysuria, trouble voiding, or hematuria Hematological and Lymphatic: negative Breast: negative Musculoskeletal: negative Remaining ROS negative.  Medications:   I have reviewed the patient's current medications.  Current Outpatient Prescriptions  Medication Sig Dispense  Refill  . LORazepam (ATIVAN) 0.5 MG tablet Take 1 tablet (0.5 mg total) by mouth every 6 (six) hours as needed (Nausea or vomiting).  30 tablet  0  . Multiple Vitamin (MULTIVITAMIN) tablet Take 1 tablet by mouth daily. chewable      . Chlorpheniramine-PSE-Ibuprofen (ADVIL ALLERGY SINUS PO) Take 1 tablet by mouth as needed. For allergies      . ciprofloxacin (CIPRO) 500 MG tablet Take 1 tablet (500 mg total) by mouth 2 (two) times daily. 1 po BID as directed for neutropenia  60 tablet  0  . dexamethasone (DECADRON) 4 MG tablet Take 2 tablets by mouth once a day on the day after chemotherapy and then take 2 tablets two times a day for 2 days. Take with food.  30 tablet  1  . EPINEPHrine (EPIPEN) 0.3 mg/0.3 mL DEVI Inject 0.3 mg into the muscle once.        . lidocaine-prilocaine (EMLA) cream Apply topically as needed.  30 g  1  . ondansetron (ZOFRAN) 8 MG tablet Take 1 tablet two times a day as needed for nausea or vomiting starting on the third day after chemotherapy.  30 tablet  1  . prochlorperazine (COMPAZINE) 10 MG tablet Take 1 tablet (10 mg total) by mouth every 6 (six) hours as needed (Nausea or vomiting).  30 tablet  1  . prochlorperazine (COMPAZINE) 25 MG suppository Place 1 suppository (25 mg total) rectally every 12 (twelve) hours as needed for nausea.  12 suppository  3   No current facility-administered medications for this visit.   Facility-Administered Medications Ordered in Other Visits  Medication Dose Route Frequency Provider Last Rate Last Dose  . lidocaine-prilocaine (EMLA) cream  Topical Once Pierce Crane, MD        Allergies:  Allergies  Allergen Reactions  . Dextromethorphan Rash    Physical Exam: Filed Vitals:   03/22/12 0912  BP: 120/75  Pulse: 72  Temp: 97.7 F (36.5 C)    Body mass index is 25.15 kg/(m^2). Weight: 170 lbs. HEENT:  Sclerae anicteric, conjunctivae pink.  Oropharynx clear.  No mucositis or candidiasis.   Nodes:  No cervical, supraclavicular,  or axillary lymphadenopathy palpated.  Breast Exam:  Deferred.   Lungs:  Clear to auscultation bilaterally.  No crackles, rhonchi, or wheezes.   Heart:  Regular rate and rhythm.   Abdomen:  Soft, nontender.  Positive bowel sounds.  No organomegaly or masses palpated.   Musculoskeletal:  No focal spinal tenderness to palpation.  Extremities:  Benign.  No peripheral edema or cyanosis.   Skin:  Benign.   Neuro:  Nonfocal, alert and oriented x 3.  Lab Results: Lab Results  Component Value Date   WBC 1.2* 03/22/2012   HGB 12.2 03/22/2012   HCT 36.5 03/22/2012   MCV 86.7 03/22/2012   PLT 145 03/22/2012   NEUTROABS 0.2* 03/22/2012     Chemistry      Component Value Date/Time   NA 137 03/03/2012 0823   K 3.9 03/03/2012 0823   CL 102 03/03/2012 0823   CO2 26 03/03/2012 0823   BUN 13 03/03/2012 0823   CREATININE 0.89 03/03/2012 0823      Component Value Date/Time   CALCIUM 9.2 03/03/2012 0823   ALKPHOS 44 03/03/2012 0823   AST 23 03/03/2012 0823   ALT 19 03/03/2012 0823   BILITOT 0.4 03/03/2012 0823      Lab Results  Component Value Date   LABCA2 22 03/03/2012   2D Echocardiogram: 03/08/12 Transthoracic Echocardiography Patient: Betty [name], Betty [name] MR #: [id number] Study Date: 03/08/2012 Gender: F Age: 38 Height: 152.4cm Weight: 77.3kg BSA: 1.62m^2 Pt. Status: Room:  PERFORMING [facility], Ec ATTENDING Ilda Foil SONOGRAPHER Jeryl Columbia cc:  ------------------------------------------------------------ LV EF: 60% - 65%  ------------------------------------------------------------ Indications: 174.9.  ------------------------------------------------------------ Study Conclusions  Left ventricle: The cavity size was normal. Systolic function was normal. The estimated ejection fraction was in the range of 60% to 65%. Wall motion was normal; there were no regional wall motion abnormalities. Transthoracic echocardiography. M-mode, complete 2D, spectral  Doppler, and color Doppler. Height: Height: 152.4cm. Height: 60in. Weight: Weight: 77.3kg. Weight: 170lb. Body mass index: BMI: 33.3kg/m^2. Body surface area: BSA: 1.4m^2. Patient status: Inpatient. Location: Echo laboratory.     Radiological Studies: US Breast Right 02/20/2012  *RADIOLOGY REPORT*  Clinical Data:  Palpable right breast mass in a 36 year old female  DIGITAL DIAGNOSTIC BILATERAL MAMMOGRAM WITH CAD AND RIGHT BREAST ULTRASOUND:  Comparison:  None.  Findings:  There are scattered fibroglandular densities.  Far posteriorly in the lateral aspect of the right breast is an obscured mass with distortion.  The mass is difficult to measure, it is approximately 2.4 cm.  There are no associated malignant-type microcalcifications.  The left breast is negative. Mammographic images were processed with CAD.  On physical exam, I palpate a discrete mass in the right breast at 8 o'clock 1 cm from the nipple.  Ultrasound is performed, showing there is an irregular, hypoechoic mass in the right breast at 8 o'clock 1 cm from the nipple measuring 2.1 x 1.1 x 1.6 cm.  Sonographic evaluation of the right axilla fails to reveal any enlarged adenopathy.  IMPRESSION: Suspicious right breast  mass.  Tissue sampling is recommended. Ultrasound-guided core biopsy will be performed and dictated separately.  BI-RADS CATEGORY 4:  Suspicious abnormality - biopsy should be considered.  Original Report Authenticated By: Littie Deeds. Judyann Munson, M.D.   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  BILATERAL BREAST MRI WITH AND WITHOUT CONTRAST  Technique: Multiplanar, multisequence MR images of both breasts were obtained prior to and following the intravenous administration of 14ml of Multihance.  Three dimensional images were evaluated at the independent DynaCad  workstation.  Comparison:  Bilateral mammogram, right breast ultrasound, right breast ultrasound guided biopsy, and post-biopsy clip mammogram 02/20/2012.  Findings: There is a mild background parenchymal enhancement pattern bilaterally.  In the posterior third of the 8 o'clock position of the right breast is a 2.2 x 1.6 x 1.9 cm irregular mass with heterogeneous enhancment and washout kinetics.  There is hyperemia surrounding the mass.  Biopsy clip artifact is seen within the mass. No additional suspicious mass or enhancement is seen in the right breast.  No mass or suspicious enhancement is identified the left breast to suggest malignancy.  No axillary or internal mammary chain lymphadenopathy is identified.  IMPRESSION:  1.  Solitary 2.2 cm biopsy-proven carcinoma in the 8 o'clock position of the right breast.  2.  No MRI evidence of malignancy in the left breast.  THREE-DIMENSIONAL MR IMAGE RENDERING ON INDEPENDENT WORKSTATION:  Three-dimensional MR images were rendered by post-processing of the original MR data on an independent workstation.  The three- dimensional MR images were interpreted, and findings were reported in the accompanying complete MRI report for this study.  BI-RADS CATEGORY 6:  Known biopsy-proven malignancy - appropriate action should be taken.  Original Report Authenticated By: Britta Mccreedy, M.D.   Nm Pet Image Initial (pi) Skull Base To Thigh 03/09/2012  *RADIOLOGY REPORT*  Clinical Data: Initial treatment strategy for right-sided breast cancer.  Biopsy 02/20/2012.  NUCLEAR MEDICINE PET CT SKULL BASE TO THIGH  Technique:  Technique:  18.0 mCi F-18 FDG was injected intravenously. CT data was obtained and used for attenuation correction and anatomic localization only.  (This was not acquired as a diagnostic CT examination.) Additional exam technical data entered on technologist worksheet.  Comparison: Breast MR 02/27/2012.  Findings: Neck: Nasal pharyngeal hypermetabolism is likely  physiologic and is without CT correlate.  Chest:  Right breast primary measures 2.1 cm and a S.U.V. max of 11.6 on image 101.  No axillary, mediastinal, or hilar nodal hypermetabolism.  Abdomen/Pelvis:  No abnormal hypermetabolism.  [place]:  No evidence of hypermetabolic bone metastasis.  CT images performed for attenuation correction demonstrate complete opacification of the left maxillary sinus.  Partial opacification of the right maxillary sinus.  Mild expansion of the left maxillary sinus.  No significant findings within the chest.  Normal adrenal glands.  IMPRESSION: Right breast primary, without hypermetabolic nodal or distant metastasis.  Sinus disease.  Original Report Authenticated By: Consuello Bossier, M.D.   [place] Core Biopsy 02/20/2012  *RADIOLOGY REPORT*  Clinical Data:  Suspicious right breast mass  ULTRASOUND GUIDED VACUUM ASSISTED CORE BIOPSY OF THE RIGHT BREAST  Comparison: None.  I met with the patient and we discussed the procedure of ultrasound- guided biopsy, including benefits and alternatives.  We discussed the high likelihood of a successful procedure. We discussed the risks of the procedure, including  infection, bleeding, tissue injury, clip migration, and inadequate sampling.  Informed, written consent was given.  Using sterile technique, 2% lidocaine, ultrasound guidance, and a 12 gauge vacuum assisted needle, biopsy was performed of a mass in the 8 o'clock region of the right breast.  At the conclusion of the procedure, a tissue marker clip was deployed into the biopsy cavity.  Follow-up 2-view mammogram was performed and dictated separately.  IMPRESSION: Ultrasound-guided biopsy of the right breast.  No apparent complications.  Original Report Authenticated By: Littie Deeds. Judyann Munson, M.D.   Dg Chest Port 1 View 03/10/2012  *RADIOLOGY REPORT*  Clinical Data: Port-A-Cath placement.  PORTABLE CHEST - 1 VIEW  Comparison: No comparison chest x-ray.  Findings: Left Mediport has been placed with the tip  at the level of the proximal superior vena cava.  No gross pneumothorax.  No infiltrate or congestive heart failure.  No plain film evidence of thoracic metastatic disease.  Heart size within normal limits.  IMPRESSION: Left Mediport has been placed with the tip at the level of the proximal superior vena cava.  No gross pneumothorax.  Original Report Authenticated By: Fuller Canada, M.D.   Mm Digital Diagnostic Bilat 02/20/2012  *RADIOLOGY REPORT*  Clinical Data:  Palpable right breast mass in a 36 year old female  DIGITAL DIAGNOSTIC BILATERAL MAMMOGRAM WITH CAD AND RIGHT BREAST ULTRASOUND:  Comparison:  None.  Findings:  There are scattered fibroglandular densities.  Far posteriorly in the lateral aspect of the right breast is an obscured mass with distortion.  The mass is difficult to measure, it is approximately 2.4 cm.  There are no associated malignant-type microcalcifications.  The left breast is negative. Mammographic images were processed with CAD.  On physical exam, I palpate a discrete mass in the right breast at 8 o'clock 1 cm from the nipple.  Ultrasound is performed, showing there is an irregular, hypoechoic mass in the right breast at 8 o'clock 1 cm from the nipple measuring 2.1 x 1.1 x 1.6 cm.  Sonographic evaluation of the right axilla fails to reveal any enlarged adenopathy.  IMPRESSION: Suspicious right breast mass.  Tissue sampling is recommended. Ultrasound-guided core biopsy will be performed and dictated separately.  BI-RADS CATEGORY 4:  Suspicious abnormality - biopsy should be considered.  Original Report Authenticated By: Littie Deeds. Judyann Munson, M.D.   Mm Digital Diagnostic Unilat R 02/23/2012  *RADIOLOGY REPORT*  Clinical Data:  Status post ultrasound-guided core biopsy of a a right breast mass  DIGITAL DIAGNOSTIC RIGHT MAMMOGRAM  Comparison:  None.  Findings:  Films are performed following ultrasound guided biopsy of a mass in the 8 o'clock region of the right breast. Mammographic images  demonstrate there is a wing shaped InRad the clip in the mass in the 8 o'clock position of the right breast.  IMPRESSION: Status post ultrasound-guided core biopsy of a right breast mass with pathology pending.  Original Report Authenticated By: Littie Deeds. Judyann Munson, M.D.   Mm Radiologist Eval And Mgmt 02/23/2012  *RADIOLOGY REPORT*  ESTABLISHED PATIENT OFFICE VISIT - LEVEL II [phone])  Chief Complaint:  The patient returns for discussion of the pathologic report after ultrasound-guided core needle biopsy of a mass at 8 o'clock in the right breast.  History:  The patient has felt a tender lump in the right lower outer quadrant for 1 month.  An ill defined solid mass was noted at 8 o'clock upon the imaging of the right breast and ultrasound- guided core needle biopsy was performed on 02/20/2012.  Exam:  On  physical examination, the biopsy site demonstrates mild ecchymosis.  There is no sign of hematoma or infection.  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  This is concordant with the imaging findings.  Results were discussed with the patient, her mother and her father.  Breast MRI was scheduled for 02/27/2012. The patient was scheduled to be seen in the [facility] on 03/03/2012.  Questions were answered. Educational materials were given.  Original Report Authenticated By: Daryl Eastern, M.D.     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  2. Profound afebrile neutropenia  3. BRCA1 positive  Case to be reviewed with Dr Pierce Crane.  Plan:  Jimmy will initiate Cipro 500 mg by mouth twice a day as directed. She will follow neutropenic cautions including monitoring her temperatures. She'll return on 03/30/2012  for followup exam with Dr. Donnie Coffin in anticipation of day 1 cycle 2 of 4 planned neoadjuvant dose dense FEC with Neulasta on day 2. Pertaining to her dexamethasone dosing, she'll take 4 mg by mouth twice a day on day 2 and 3, and then 4 mg in the a.m. on day 4. She knows to contact us sooner if the need should arise. This plan was reviewed with the patient, who voices understanding and agreement.  She knows to call with any changes or problems.    Lameeka Schleifer T, PA-C 03/22/2012

## 2012-03-23 ENCOUNTER — Telehealth: Payer: Self-pay | Admitting: *Deleted

## 2012-03-23 ENCOUNTER — Encounter: Payer: Self-pay | Admitting: Genetic Counselor

## 2012-03-23 NOTE — Telephone Encounter (Signed)
Pt reports lower back pain that is helped by taking tylenol. Left pt a message asking for a return call 03/24/12 to report worsening sx.

## 2012-03-25 ENCOUNTER — Encounter: Payer: Self-pay | Admitting: Oncology

## 2012-03-25 NOTE — Progress Notes (Signed)
Patient overqualified for financial assistance,income [phone].

## 2012-03-30 ENCOUNTER — Encounter: Payer: Self-pay | Admitting: *Deleted

## 2012-03-30 ENCOUNTER — Other Ambulatory Visit (HOSPITAL_BASED_OUTPATIENT_CLINIC_OR_DEPARTMENT_OTHER): Payer: BC Managed Care – PPO | Admitting: Lab

## 2012-03-30 ENCOUNTER — Ambulatory Visit (HOSPITAL_BASED_OUTPATIENT_CLINIC_OR_DEPARTMENT_OTHER): Payer: BC Managed Care – PPO

## 2012-03-30 ENCOUNTER — Ambulatory Visit (HOSPITAL_BASED_OUTPATIENT_CLINIC_OR_DEPARTMENT_OTHER): Payer: BC Managed Care – PPO | Admitting: Oncology

## 2012-03-30 VITALS — BP 139/90 | HR 68 | Temp 97.9°F | Ht 69.0 in | Wt 168.5 lb

## 2012-03-30 DIAGNOSIS — Z5111 Encounter for antineoplastic chemotherapy: Secondary | ICD-10-CM

## 2012-03-30 DIAGNOSIS — C50519 Malignant neoplasm of lower-outer quadrant of unspecified female breast: Secondary | ICD-10-CM

## 2012-03-30 DIAGNOSIS — D702 Other drug-induced agranulocytosis: Secondary | ICD-10-CM

## 2012-03-30 DIAGNOSIS — Z1501 Genetic susceptibility to malignant neoplasm of breast: Secondary | ICD-10-CM

## 2012-03-30 LAB — COMPREHENSIVE METABOLIC PANEL
ALT: 22 U/L (ref 0–35)
AST: 22 U/L (ref 0–37)
Albumin: 4.4 g/dL (ref 3.5–5.2)
Calcium: 8.7 mg/dL (ref 8.4–10.5)
Chloride: 108 mEq/L (ref 96–112)
Potassium: 4 mEq/L (ref 3.5–5.3)

## 2012-03-30 LAB — CBC WITH DIFFERENTIAL/PLATELET
BASO%: 0.5 % (ref 0.0–2.0)
EOS%: 0.5 % (ref 0.0–7.0)
MCH: 29.4 pg (ref 25.1–34.0)
MCHC: 34.5 g/dL (ref 31.5–36.0)
RBC: 4.36 10*6/uL (ref 3.70–5.45)
RDW: 13.1 % (ref 11.2–14.5)
lymph#: 1.8 10*3/uL (ref 0.9–3.3)

## 2012-03-30 MED ORDER — PALONOSETRON HCL INJECTION 0.25 MG/5ML
0.2500 mg | Freq: Once | INTRAVENOUS | Status: AC
  Administered 2012-03-30: 0.25 mg via INTRAVENOUS

## 2012-03-30 MED ORDER — ALTEPLASE 2 MG IJ SOLR
2.0000 mg | Freq: Once | INTRAMUSCULAR | Status: DC | PRN
  Filled 2012-03-30: qty 2

## 2012-03-30 MED ORDER — SODIUM CHLORIDE 0.9 % IV SOLN
Freq: Once | INTRAVENOUS | Status: AC
  Administered 2012-03-30: 13:00:00 via INTRAVENOUS

## 2012-03-30 MED ORDER — EPIRUBICIN HCL CHEMO IV INJECTION 200 MG/100ML
100.0000 mg/m2 | Freq: Once | INTRAVENOUS | Status: AC
  Administered 2012-03-30: 194 mg via INTRAVENOUS
  Filled 2012-03-30: qty 97

## 2012-03-30 MED ORDER — HEPARIN SOD (PORK) LOCK FLUSH 100 UNIT/ML IV SOLN
500.0000 [IU] | Freq: Once | INTRAVENOUS | Status: AC | PRN
  Administered 2012-03-30: 500 [IU]
  Filled 2012-03-30: qty 5

## 2012-03-30 MED ORDER — SODIUM CHLORIDE 0.9 % IV SOLN
150.0000 mg | Freq: Once | INTRAVENOUS | Status: AC
  Administered 2012-03-30: 150 mg via INTRAVENOUS
  Filled 2012-03-30: qty 5

## 2012-03-30 MED ORDER — SODIUM CHLORIDE 0.9 % IV SOLN
500.0000 mg/m2 | Freq: Once | INTRAVENOUS | Status: AC
  Administered 2012-03-30: 980 mg via INTRAVENOUS
  Filled 2012-03-30: qty 49

## 2012-03-30 MED ORDER — SODIUM CHLORIDE 0.9 % IJ SOLN
10.0000 mL | INTRAMUSCULAR | Status: DC | PRN
  Administered 2012-03-30: 10 mL
  Filled 2012-03-30: qty 10

## 2012-03-30 MED ORDER — FLUOROURACIL CHEMO INJECTION 2.5 GM/50ML
500.0000 mg/m2 | Freq: Once | INTRAVENOUS | Status: AC
  Administered 2012-03-30: 950 mg via INTRAVENOUS
  Filled 2012-03-30: qty 19

## 2012-03-30 MED ORDER — DEXAMETHASONE SODIUM PHOSPHATE 4 MG/ML IJ SOLN
12.0000 mg | Freq: Once | INTRAMUSCULAR | Status: AC
  Administered 2012-03-30: 12 mg via INTRAVENOUS

## 2012-03-30 NOTE — Progress Notes (Signed)
Saw patient today to address questions/concerns.  None reported at this time.  June Calendar provided.  She understands to call with questions.

## 2012-03-30 NOTE — Patient Instructions (Signed)
 Cancer Center Discharge Instructions for Patients Receiving Chemotherapy  Today you received the following chemotherapy agents 5FU, Epirubicin and Cytoxan  To help prevent nausea and vomiting after your treatment, we encourage you to take your nausea medication Begin taking it at 7 pm and take it as often as prescribed for the next 24 to 72 hours.   If you develop nausea and vomiting that is not controlled by your nausea medication, call the clinic. If it is after clinic hours your family physician or the after hours number for the clinic or go to the Emergency Department.   BELOW ARE SYMPTOMS THAT SHOULD BE REPORTED IMMEDIATELY:  *FEVER GREATER THAN 100.5 F  *CHILLS WITH OR WITHOUT FEVER  NAUSEA AND VOMITING THAT IS NOT CONTROLLED WITH YOUR NAUSEA MEDICATION  *UNUSUAL SHORTNESS OF BREATH  *UNUSUAL BRUISING OR BLEEDING  TENDERNESS IN MOUTH AND THROAT WITH OR WITHOUT PRESENCE OF ULCERS  *URINARY PROBLEMS  *BOWEL PROBLEMS  UNUSUAL RASH Items with * indicate a potential emergency and should be followed up as soon as possible.  One of the nurses will contact you 24 hours after your treatment. Please let the nurse know about any problems that you may have experienced. Feel free to call the clinic you have any questions or concerns. The clinic phone number is [phone].   I have been informed and understand all the instructions given to me. I know to contact the clinic, my physician, or go to the Emergency Department if any problems should occur. I do not have any questions at this time, but understand that I may call the clinic during office hours   should I have any questions or need assistance in obtaining follow up care.    __________________________________________  _____________  __________ Signature of Patient or Authorized Representative            Date                   Time    __________________________________________ Nurse's Signature

## 2012-03-30 NOTE — Progress Notes (Signed)
Hematology and Oncology Follow Up Visit  Betty [name] [id number] 09/23/1976 36 y.o. 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  Interim History:   Betty [name] is seen today her cousin in accompaniment for followup after her first of 4 planned cycles of neoadjuvant dose dense FEC. Of note, genetic counseling has been performed, she is BRCA1 positive. She has met with our genetic counselor today. She denies any fevers, chills, or night sweats, or joint pains. She has been amenorrheic. She denies any mouth pain. She is unsure if she feels anything in her breast. A detailed review of systems is otherwise noncontributory as noted below.  Review of Systems: Constitutional:  no weight loss, fever, night sweats and feels well Eyes: no complaints ENT: no complaints Cardiovascular: no chest pain or dyspnea on exertion Respiratory: no cough, shortness of breath, or wheezing Neurological: no TIA or stroke symptoms Dermatological: negative Gastrointestinal: no abdominal pain, change in bowel habits, or black or bloody stools Genito-Urinary: no dysuria, trouble voiding, or hematuria Hematological and Lymphatic: negative Breast: negative Musculoskeletal: negative Remaining ROS negative.  Medications:   I have reviewed the patient's current medications.  Current Outpatient Prescriptions  Medication Sig Dispense Refill  . Chlorpheniramine-PSE-Ibuprofen (ADVIL ALLERGY SINUS PO) Take 1 tablet by mouth as needed. For allergies      . dexamethasone (DECADRON) 4 MG tablet Take 2 tablets by mouth once a day on the day after chemotherapy and then take 2 tablets two times a day for 2 days. Take with food.  30 tablet  1  . EPINEPHrine (EPIPEN) 0.3 mg/0.3 mL DEVI Inject 0.3 mg into the muscle once.         . lidocaine-prilocaine (EMLA) cream Apply topically as needed.  30 g  1  . LORazepam (ATIVAN) 0.5 MG tablet Take 1 tablet (0.5 mg total) by mouth every 6 (six) hours as needed (Nausea or vomiting).  30 tablet  0  . Multiple Vitamin (MULTIVITAMIN) tablet Take 1 tablet by mouth daily. chewable      . ondansetron (ZOFRAN) 8 MG tablet Take 1 tablet two times a day as needed for nausea or vomiting starting on the third day after chemotherapy.  30 tablet  1  . prochlorperazine (COMPAZINE) 10 MG tablet Take 1 tablet (10 mg total) by mouth every 6 (six) hours as needed (Nausea or vomiting).  30 tablet  1  . prochlorperazine (COMPAZINE) 25 MG suppository Place 1 suppository (25 mg total) rectally every 12 (twelve) hours as needed for nausea.  12 suppository  3   No current facility-administered medications for this visit.   Facility-Administered Medications Ordered in Other Visits  Medication Dose Route Frequency Provider Last Rate Last Dose  . lidocaine-prilocaine (EMLA) cream   Topical Once Betty Crane, MD        Allergies:  Allergies  Allergen Reactions  . Dextromethorphan Rash    Physical Exam: Filed Vitals:   03/30/12 1058  BP: 139/90  Pulse: 68  Temp: 97.9 F (36.6 C)    Body mass index is 24.88 kg/(m^2). Weight: 170 lbs. HEENT:  Sclerae anicteric, conjunctivae pink.  Oropharynx clear.  No mucositis or candidiasis.   Nodes:  No cervical, supraclavicular, or axillary lymphadenopathy palpated.  Breast Exam:  No masses detected in either breast. Lungs:  Clear to  auscultation bilaterally.  No crackles, rhonchi, or wheezes.   Heart:  Regular rate and rhythm.   Abdomen:  Soft, nontender.  Positive bowel sounds.  No organomegaly or masses palpated.   Musculoskeletal:  No focal spinal tenderness to palpation.  Extremities:  Benign.  No peripheral edema or cyanosis.   Skin:  Benign.   Neuro:  Nonfocal, alert and oriented x 3.  Lab Results: Lab Results  Component Value Date   WBC 8.2  03/30/2012   HGB 12.8 03/30/2012   HCT 37.1 03/30/2012   MCV 85.1 03/30/2012   PLT 166 03/30/2012   NEUTROABS 5.7 03/30/2012     Chemistry      Component Value Date/Time   NA 136 03/22/2012 0843   K 3.6 03/22/2012 0843   CL 101 03/22/2012 0843   CO2 27 03/22/2012 0843   BUN 10 03/22/2012 0843   CREATININE 0.74 03/22/2012 0843      Component Value Date/Time   CALCIUM 8.8 03/22/2012 0843   ALKPHOS 61 03/22/2012 0843   AST 12 03/22/2012 0843   ALT 15 03/22/2012 0843   BILITOT 0.5 03/22/2012 0843      Lab Results  Component Value Date   LABCA2 22 03/03/2012   2D Echocardiogram: 03/08/12 Transthoracic Echocardiography Patient: Betty [name], Betty [name] MR #: [id number] Study Date: 03/08/2012 Gender: F Age: 45 Height: 152.4cm Weight: 77.3kg BSA: 1.63m^2 Pt. Status: Room:  PERFORMING [facility] Cardiology, Ec ATTENDING Betty [name] SONOGRAPHER Betty [name] cc:  ------------------------------------------------------------ LV EF: 60% - 65%  ------------------------------------------------------------ Indications: 174.9.  ------------------------------------------------------------ Study Conclusions  Left ventricle: The cavity size was normal. Systolic function was normal. The estimated ejection fraction was in the range of 60% to 65%. Wall motion was normal; there were no regional wall motion abnormalities. Transthoracic echocardiography. M-mode, complete 2D, spectral Doppler, and color Doppler. Height: Height: 152.4cm. Height: 60in. Weight: Weight: 77.3kg. Weight: 170lb. Body mass index: BMI: 33.3kg/m^2. Body surface area: BSA: 1.58m^2. Patient status: Inpatient. Location: Echo laboratory.     Radiological Studies: US Breast Right 02/20/2012  *RADIOLOGY REPORT*  Clinical Data:  Palpable right breast mass in a 36 year old female  DIGITAL DIAGNOSTIC BILATERAL MAMMOGRAM WITH CAD AND RIGHT BREAST ULTRASOUND:  Comparison:  None.  Findings:  There are scattered  fibroglandular densities.  Far posteriorly in the lateral aspect of the right breast is an obscured mass with distortion.  The mass is difficult to measure, it is approximately 2.4 cm.  There are no associated malignant-type microcalcifications.  The left breast is negative. Mammographic images were processed with CAD.  On physical exam, I palpate a discrete mass in the right breast at 8 o'clock 1 cm from the nipple.  Ultrasound is performed, showing there is an irregular, hypoechoic mass in the right breast at 8 o'clock 1 cm from the nipple measuring 2.1 x 1.1 x 1.6 cm.  Sonographic evaluation of the right axilla fails to reveal any enlarged adenopathy.  IMPRESSION: Suspicious right breast mass.  Tissue sampling is recommended. Ultrasound-guided core biopsy will be performed and dictated separately.  BI-RADS CATEGORY 4:  Suspicious abnormality - biopsy should be considered.  Original Report Authenticated By: Betty [name]. Betty [name], M.D.   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  BILATERAL BREAST MRI WITH AND WITHOUT CONTRAST  Technique: Multiplanar, multisequence MR images of both breasts were obtained  prior to and following the intravenous administration of 14ml of Multihance.  Three dimensional images were evaluated at the independent DynaCad workstation.  Comparison:  Bilateral mammogram, right breast ultrasound, right breast ultrasound guided biopsy, and post-biopsy clip mammogram 02/20/2012.  Findings: There is a mild background parenchymal enhancement pattern bilaterally.  In the posterior third of the 8 o'clock position of the right breast is a 2.2 x 1.6 x 1.9 cm irregular mass with heterogeneous enhancment and washout kinetics.  There is hyperemia surrounding the mass.  Biopsy clip artifact is seen within the mass. No additional  suspicious mass or enhancement is seen in the right breast.  No mass or suspicious enhancement is identified the left breast to suggest malignancy.  No axillary or internal mammary chain lymphadenopathy is identified.  IMPRESSION:  1.  Solitary 2.2 cm biopsy-proven carcinoma in the 8 o'clock position of the right breast.  2.  No MRI evidence of malignancy in the left breast.  THREE-DIMENSIONAL MR IMAGE RENDERING ON INDEPENDENT WORKSTATION:  Three-dimensional MR images were rendered by post-processing of the original MR data on an independent workstation.  The three- dimensional MR images were interpreted, and findings were reported in the accompanying complete MRI report for this study.  BI-RADS CATEGORY 6:  Known biopsy-proven malignancy - appropriate action should be taken.  Original Report Authenticated By: Betty [name], M.D.   Nm Pet Image Initial (pi) Skull Base To Thigh 03/09/2012  *RADIOLOGY REPORT*  Clinical Data: Initial treatment strategy for right-sided breast cancer.  Biopsy 02/20/2012.  NUCLEAR MEDICINE PET CT SKULL BASE TO THIGH  Technique:  Technique:  18.0 mCi F-18 FDG was injected intravenously. CT data was obtained and used for attenuation correction and anatomic localization only.  (This was not acquired as a diagnostic CT examination.) Additional exam technical data entered on technologist worksheet.  Comparison: Breast MR 02/27/2012.  Findings: Neck: Nasal pharyngeal hypermetabolism is likely physiologic and is without CT correlate.  Chest:  Right breast primary measures 2.1 cm and a S.U.V. max of 11.6 on image 101.  No axillary, mediastinal, or hilar nodal hypermetabolism.  Abdomen/Pelvis:  No abnormal hypermetabolism.  [place]:  No evidence of hypermetabolic bone metastasis.  CT images performed for attenuation correction demonstrate complete opacification of the left maxillary sinus.  Partial opacification of the right maxillary sinus.  Mild expansion of the left maxillary sinus.  No  significant findings within the chest.  Normal adrenal glands.  IMPRESSION: Right breast primary, without hypermetabolic nodal or distant metastasis.  Sinus disease.  Original Report Authenticated By: Betty [name], M.D.   [place] Core Biopsy 02/20/2012  *RADIOLOGY REPORT*  Clinical Data:  Suspicious right breast mass  ULTRASOUND GUIDED VACUUM ASSISTED CORE BIOPSY OF THE RIGHT BREAST  Comparison: None.  I met with the patient and we discussed the procedure of ultrasound- guided biopsy, including benefits and alternatives.  We discussed the high likelihood of a successful procedure. We discussed the risks of the procedure, including infection, bleeding, tissue injury, clip migration, and inadequate sampling.  Informed, written consent was given.  Using sterile technique, 2% lidocaine, ultrasound guidance, and a 12 gauge vacuum assisted needle, biopsy was performed of a mass in the 8 o'clock region of the right breast.  At the conclusion of the procedure, a tissue marker clip was deployed into the biopsy cavity.  Follow-up 2-view mammogram was performed and dictated separately.  IMPRESSION: Ultrasound-guided biopsy of the right breast.  No apparent complications.  Original Report Authenticated By: Betty [name]. Betty [name], M.D.  Dg Chest Port 1 View 03/10/2012  *RADIOLOGY REPORT*  Clinical Data: Port-A-Cath placement.  PORTABLE CHEST - 1 VIEW  Comparison: No comparison chest x-ray.  Findings: Left Mediport has been placed with the tip at the level of the proximal superior vena cava.  No gross pneumothorax.  No infiltrate or congestive heart failure.  No plain film evidence of thoracic metastatic disease.  Heart size within normal limits.  IMPRESSION: Left Mediport has been placed with the tip at the level of the proximal superior vena cava.  No gross pneumothorax.  Original Report Authenticated By: Betty [name], M.D.   Mm Digital Diagnostic Bilat 02/20/2012  *RADIOLOGY REPORT*  Clinical Data:  Palpable right breast mass in  a 37 year old female  DIGITAL DIAGNOSTIC BILATERAL MAMMOGRAM WITH CAD AND RIGHT BREAST ULTRASOUND:  Comparison:  None.  Findings:  There are scattered fibroglandular densities.  Far posteriorly in the lateral aspect of the right breast is an obscured mass with distortion.  The mass is difficult to measure, it is approximately 2.4 cm.  There are no associated malignant-type microcalcifications.  The left breast is negative. Mammographic images were processed with CAD.  On physical exam, I palpate a discrete mass in the right breast at 8 o'clock 1 cm from the nipple.  Ultrasound is performed, showing there is an irregular, hypoechoic mass in the right breast at 8 o'clock 1 cm from the nipple measuring 2.1 x 1.1 x 1.6 cm.  Sonographic evaluation of the right axilla fails to reveal any enlarged adenopathy.  IMPRESSION: Suspicious right breast mass.  Tissue sampling is recommended. Ultrasound-guided core biopsy will be performed and dictated separately.  BI-RADS CATEGORY 4:  Suspicious abnormality - biopsy should be considered.  Original Report Authenticated By: Betty [name]. Betty [name], M.D.   Mm Digital Diagnostic Unilat R 02/23/2012  *RADIOLOGY REPORT*  Clinical Data:  Status post ultrasound-guided core biopsy of a a right breast mass  DIGITAL DIAGNOSTIC RIGHT MAMMOGRAM  Comparison:  None.  Findings:  Films are performed following ultrasound guided biopsy of a mass in the 8 o'clock region of the right breast. Mammographic images demonstrate there is a wing shaped [employer] the clip in the mass in the 8 o'clock position of the right breast.  IMPRESSION: Status post ultrasound-guided core biopsy of a right breast mass with pathology pending.  Original Report Authenticated By: Betty [name]. Betty [name], M.D.   Mm Radiologist Eval And Mgmt 02/23/2012  *RADIOLOGY REPORT*  ESTABLISHED PATIENT OFFICE VISIT - LEVEL II [phone])  Chief Complaint:  The patient returns for discussion of the pathologic report after ultrasound-guided core needle biopsy of  a mass at 8 o'clock in the right breast.  History:  The patient has felt a tender lump in the right lower outer quadrant for 1 month.  An ill defined solid mass was noted at 8 o'clock upon the imaging of the right breast and ultrasound- guided core needle biopsy was performed on 02/20/2012.  Exam:  On physical examination, the biopsy site demonstrates mild ecchymosis.  There is no sign of hematoma or infection.  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  This is concordant with the imaging findings.  Results were discussed with the patient, her mother and her father.  Breast MRI was scheduled for 02/27/2012. The patient was scheduled to be seen in the [facility] on 03/03/2012.  Questions were answered. Educational materials were given.  Original Report Authenticated By: Betty [name], M.D.  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  2. Profound afebrile neutropenia  3. BRCA1 positive  Case to be reviewed with Dr Betty [name].  Plan:  Betty [name] is doing well. We reviewed her BRCA1 results and its implications. She will likely have bilateral mastectomies. She will follow neutropenic cautions including monitoring her temperatures. She is doing well ands likely having some early menopause. She knows that she will need bilateral oophorectomies likely by the time she is 42. We will see her in 1 week. This plan was reviewed with the patient, who voices understanding and agreement.  She knows to call with any changes or problems.    Betty [name] 03/30/2012

## 2012-03-31 ENCOUNTER — Ambulatory Visit (HOSPITAL_BASED_OUTPATIENT_CLINIC_OR_DEPARTMENT_OTHER): Payer: BC Managed Care – PPO

## 2012-03-31 VITALS — BP 119/75 | HR 67 | Temp 97.3°F

## 2012-03-31 DIAGNOSIS — Z5189 Encounter for other specified aftercare: Secondary | ICD-10-CM

## 2012-03-31 DIAGNOSIS — C50519 Malignant neoplasm of lower-outer quadrant of unspecified female breast: Secondary | ICD-10-CM

## 2012-03-31 MED ORDER — PEGFILGRASTIM INJECTION 6 MG/0.6ML
6.0000 mg | Freq: Once | SUBCUTANEOUS | Status: AC
  Administered 2012-03-31: 6 mg via SUBCUTANEOUS
  Filled 2012-03-31: qty 0.6

## 2012-04-06 ENCOUNTER — Other Ambulatory Visit (HOSPITAL_BASED_OUTPATIENT_CLINIC_OR_DEPARTMENT_OTHER): Payer: BC Managed Care – PPO | Admitting: Lab

## 2012-04-06 ENCOUNTER — Ambulatory Visit (HOSPITAL_BASED_OUTPATIENT_CLINIC_OR_DEPARTMENT_OTHER): Payer: BC Managed Care – PPO | Admitting: Physician Assistant

## 2012-04-06 VITALS — BP 113/72 | HR 71 | Temp 98.0°F | Ht 69.0 in | Wt 173.2 lb

## 2012-04-06 DIAGNOSIS — C50919 Malignant neoplasm of unspecified site of unspecified female breast: Secondary | ICD-10-CM

## 2012-04-06 DIAGNOSIS — C50519 Malignant neoplasm of lower-outer quadrant of unspecified female breast: Secondary | ICD-10-CM

## 2012-04-06 DIAGNOSIS — Z1501 Genetic susceptibility to malignant neoplasm of breast: Secondary | ICD-10-CM

## 2012-04-06 LAB — CBC WITH DIFFERENTIAL/PLATELET
BASO%: 2.2 % — ABNORMAL HIGH (ref 0.0–2.0)
LYMPH%: 42.4 % (ref 14.0–49.7)
MCHC: 33.8 g/dL (ref 31.5–36.0)
MCV: 86.9 fL (ref 79.5–101.0)
MONO#: 0.1 10*3/uL (ref 0.1–0.9)
MONO%: 7.5 % (ref 0.0–14.0)
Platelets: 140 10*3/uL — ABNORMAL LOW (ref 145–400)
RBC: 3.76 10*6/uL (ref 3.70–5.45)
WBC: 1.8 10*3/uL — ABNORMAL LOW (ref 3.9–10.3)
nRBC: 0 % (ref 0–0)

## 2012-04-08 NOTE — Progress Notes (Signed)
Hematology and Oncology Follow Up Visit  Betty [name] [id number] January 06, 1976 36 y.o. 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  2. History of profound neutropenia despite Neulasta on day 2.  3. BRCA1 positive.  Interim History:   Betty [name] is seen today unaccompanied for followup after her second of 4 planned cycles of neoadjuvant dose dense FEC.  She denies any fevers, chills, or night sweats.  She denies any nausea, or emesis issues. No diarrhea or constipation problems. Her energy level is recovering. She denies a diffuse bone pain.   A detailed review of systems is otherwise noncontributory as noted below.  Review of Systems: Constitutional:  no weight loss, fever, night sweats and feels well Eyes: no complaints ENT: no complaints Cardiovascular: no chest pain or dyspnea on exertion Respiratory: no cough, shortness of breath, or wheezing Neurological: no TIA or stroke symptoms Dermatological: negative Gastrointestinal: no abdominal pain, change in bowel habits, or black or bloody stools Genito-Urinary: no dysuria, trouble voiding, or hematuria Hematological and Lymphatic: negative Breast: negative Musculoskeletal: negative Remaining ROS negative.  Medications:   I have reviewed the patient's current medications.  Current Outpatient Prescriptions  Medication Sig Dispense Refill  . Chlorpheniramine-PSE-Ibuprofen (ADVIL ALLERGY SINUS PO) Take 1 tablet by mouth as needed. For allergies      . dexamethasone (DECADRON) 4 MG tablet Take 2 tablets by mouth once a day on the day after chemotherapy and then take 2 tablets two times a day for 2 days. Take with food.  30 tablet  1  . EPINEPHrine (EPIPEN) 0.3 mg/0.3 mL DEVI Inject 0.3 mg into the muscle once.        .  lidocaine-prilocaine (EMLA) cream Apply topically as needed.  30 g  1  . LORazepam (ATIVAN) 0.5 MG tablet Take 1 tablet (0.5 mg total) by mouth every 6 (six) hours as needed (Nausea or vomiting).  30 tablet  0  . Multiple Vitamin (MULTIVITAMIN) tablet Take 1 tablet by mouth daily. chewable      . ondansetron (ZOFRAN) 8 MG tablet Take 1 tablet two times a day as needed for nausea or vomiting starting on the third day after chemotherapy.  30 tablet  1  . prochlorperazine (COMPAZINE) 10 MG tablet Take 1 tablet (10 mg total) by mouth every 6 (six) hours as needed (Nausea or vomiting).  30 tablet  1  . prochlorperazine (COMPAZINE) 25 MG suppository Place 1 suppository (25 mg total) rectally every 12 (twelve) hours as needed for nausea.  12 suppository  3    Allergies:  Allergies  Allergen Reactions  . Dextromethorphan Rash    Physical Exam: Filed Vitals:   04/06/12 1516  BP: 113/72  Pulse: 71  Temp: 98 F (36.7 C)    Body mass index is 25.58 kg/(m^2). Weight: 173 lbs. HEENT:  Sclerae anicteric, conjunctivae pink.  Oropharynx clear.  No mucositis or candidiasis.   Nodes:  No cervical, supraclavicular, or axillary lymphadenopathy palpated.  Breast Exam:  Deferred.   Lungs:  Clear to auscultation bilaterally.  No crackles, rhonchi, or wheezes.   Heart:  Regular rate and rhythm.   Abdomen:  Soft, nontender.  Positive bowel sounds.  No organomegaly or masses palpated.   Musculoskeletal:  No focal spinal tenderness to palpation.  Extremities:  Benign.  No peripheral  edema or cyanosis.   Skin:  Benign.   Neuro:  Nonfocal, alert and oriented x 3.  Lab Results: Lab Results  Component Value Date   WBC 1.8* 04/06/2012   HGB 11.0* 04/06/2012   HCT 32.7* 04/06/2012   MCV 86.9 04/06/2012   PLT 140* 04/06/2012   NEUTROABS 0.8* 04/06/2012     Chemistry      Component Value Date/Time   NA 139 03/30/2012 1039   K 4.0 03/30/2012 1039   CL 108 03/30/2012 1039   CO2 24 03/30/2012 1039   BUN 10 03/30/2012  1039   CREATININE 0.77 03/30/2012 1039      Component Value Date/Time   CALCIUM 8.7 03/30/2012 1039   ALKPHOS 68 03/30/2012 1039   AST 22 03/30/2012 1039   ALT 22 03/30/2012 1039   BILITOT 0.2* 03/30/2012 1039      Lab Results  Component Value Date   LABCA2 22 03/03/2012   2D Echocardiogram: 03/08/12 Transthoracic Echocardiography Patient: Betty, [name] MR #: [id number] Study Date: 03/08/2012 Gender: F Age: 26 Height: 152.4cm Weight: 77.3kg BSA: 1.66m^2 Pt. Status: Room:  PERFORMING [facility] Cardiology, Ec ATTENDING Ilda Foil SONOGRAPHER Jeryl [place] cc:  ------------------------------------------------------------ LV EF: 60% - 65%  ------------------------------------------------------------ Indications: 174.9.  ------------------------------------------------------------ Study Conclusions  Left ventricle: The cavity size was normal. Systolic function was normal. The estimated ejection fraction was in the range of 60% to 65%. Wall motion was normal; there were no regional wall motion abnormalities. Transthoracic echocardiography. M-mode, complete 2D, spectral Doppler, and color Doppler. Height: Height: 152.4cm. Height: 60in. Weight: Weight: 77.3kg. Weight: 170lb. Body mass index: BMI: 33.3kg/m^2. Body surface area: BSA: 1.7m^2. Patient status: Inpatient. Location: Echo laboratory.    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  2. Mild afebrile neutropenia  3. BRCA1 positive  Case reviewed with Dr Pierce Crane.  Plan:  [name] will follow neutropenic cautions including monitoring her temperatures, but I will not place her on Cipro at this time. She'll return on 04/13/2012 for followup exam in anticipation of day 1 cycle 3/4 planned  neoadjuvant dose dense FEC with Neulasta on day 2. Pertaining to her dexamethasone dosing, she'll again take 4 mg by mouth twice a day on day 2 and 3, and then 4 mg in the a.m. on day 4. She knows to contact us sooner if the need should arise. This plan was reviewed with the patient, who voices understanding and agreement.  She knows to call with any changes or problems.    Zahira Brummond T, PA-C 04/05/12

## 2012-04-12 ENCOUNTER — Other Ambulatory Visit: Payer: Self-pay | Admitting: Physician Assistant

## 2012-04-12 DIAGNOSIS — C50519 Malignant neoplasm of lower-outer quadrant of unspecified female breast: Secondary | ICD-10-CM

## 2012-04-13 ENCOUNTER — Encounter: Payer: Self-pay | Admitting: Physician Assistant

## 2012-04-13 ENCOUNTER — Ambulatory Visit (HOSPITAL_BASED_OUTPATIENT_CLINIC_OR_DEPARTMENT_OTHER): Payer: BC Managed Care – PPO | Admitting: Physician Assistant

## 2012-04-13 ENCOUNTER — Other Ambulatory Visit (HOSPITAL_BASED_OUTPATIENT_CLINIC_OR_DEPARTMENT_OTHER): Payer: BC Managed Care – PPO | Admitting: Lab

## 2012-04-13 ENCOUNTER — Telehealth: Payer: Self-pay | Admitting: Oncology

## 2012-04-13 ENCOUNTER — Ambulatory Visit (HOSPITAL_BASED_OUTPATIENT_CLINIC_OR_DEPARTMENT_OTHER): Payer: BC Managed Care – PPO

## 2012-04-13 VITALS — BP 126/82 | HR 61 | Temp 98.1°F | Ht 69.0 in | Wt 170.3 lb

## 2012-04-13 DIAGNOSIS — Z171 Estrogen receptor negative status [ER-]: Secondary | ICD-10-CM

## 2012-04-13 DIAGNOSIS — C50519 Malignant neoplasm of lower-outer quadrant of unspecified female breast: Secondary | ICD-10-CM

## 2012-04-13 DIAGNOSIS — Z1509 Genetic susceptibility to other malignant neoplasm: Secondary | ICD-10-CM

## 2012-04-13 DIAGNOSIS — Z1501 Genetic susceptibility to malignant neoplasm of breast: Secondary | ICD-10-CM

## 2012-04-13 DIAGNOSIS — Z5111 Encounter for antineoplastic chemotherapy: Secondary | ICD-10-CM

## 2012-04-13 LAB — COMPREHENSIVE METABOLIC PANEL
Alkaline Phosphatase: 82 U/L (ref 39–117)
BUN: 13 mg/dL (ref 6–23)
Creatinine, Ser: 0.79 mg/dL (ref 0.50–1.10)
Glucose, Bld: 73 mg/dL (ref 70–99)
Sodium: 139 mEq/L (ref 135–145)
Total Bilirubin: 0.4 mg/dL (ref 0.3–1.2)
Total Protein: 6.6 g/dL (ref 6.0–8.3)

## 2012-04-13 LAB — CBC WITH DIFFERENTIAL/PLATELET
Basophils Absolute: 0.1 10*3/uL (ref 0.0–0.1)
EOS%: 0.1 % (ref 0.0–7.0)
HGB: 11.7 g/dL (ref 11.6–15.9)
LYMPH%: 17 % (ref 14.0–49.7)
MCH: 29.1 pg (ref 25.1–34.0)
MCV: 85.6 fL (ref 79.5–101.0)
MONO%: 5.4 % (ref 0.0–14.0)
Platelets: 162 10*3/uL (ref 145–400)
RBC: 4.02 10*6/uL (ref 3.70–5.45)
RDW: 14.6 % — ABNORMAL HIGH (ref 11.2–14.5)

## 2012-04-13 LAB — LACTATE DEHYDROGENASE: LDH: 183 U/L (ref 94–250)

## 2012-04-13 MED ORDER — SODIUM CHLORIDE 0.9 % IV SOLN
150.0000 mg | Freq: Once | INTRAVENOUS | Status: AC
  Administered 2012-04-13: 150 mg via INTRAVENOUS
  Filled 2012-04-13: qty 5

## 2012-04-13 MED ORDER — HEPARIN SOD (PORK) LOCK FLUSH 100 UNIT/ML IV SOLN
500.0000 [IU] | Freq: Once | INTRAVENOUS | Status: AC | PRN
  Administered 2012-04-13: 500 [IU]
  Filled 2012-04-13: qty 5

## 2012-04-13 MED ORDER — FLUOROURACIL CHEMO INJECTION 2.5 GM/50ML
500.0000 mg/m2 | Freq: Once | INTRAVENOUS | Status: AC
  Administered 2012-04-13: 950 mg via INTRAVENOUS
  Filled 2012-04-13: qty 19

## 2012-04-13 MED ORDER — DEXAMETHASONE SODIUM PHOSPHATE 4 MG/ML IJ SOLN
12.0000 mg | Freq: Once | INTRAMUSCULAR | Status: AC
  Administered 2012-04-13: 12 mg via INTRAVENOUS

## 2012-04-13 MED ORDER — EPIRUBICIN HCL CHEMO IV INJECTION 200 MG/100ML
100.0000 mg/m2 | Freq: Once | INTRAVENOUS | Status: AC
  Administered 2012-04-13: 194 mg via INTRAVENOUS
  Filled 2012-04-13: qty 97

## 2012-04-13 MED ORDER — SODIUM CHLORIDE 0.9 % IJ SOLN
10.0000 mL | INTRAMUSCULAR | Status: DC | PRN
  Administered 2012-04-13: 10 mL
  Filled 2012-04-13: qty 10

## 2012-04-13 MED ORDER — SODIUM CHLORIDE 0.9 % IV SOLN
Freq: Once | INTRAVENOUS | Status: AC
  Administered 2012-04-13: 13:00:00 via INTRAVENOUS

## 2012-04-13 MED ORDER — PALONOSETRON HCL INJECTION 0.25 MG/5ML
0.2500 mg | Freq: Once | INTRAVENOUS | Status: AC
  Administered 2012-04-13: 0.25 mg via INTRAVENOUS

## 2012-04-13 MED ORDER — SODIUM CHLORIDE 0.9 % IV SOLN
500.0000 mg/m2 | Freq: Once | INTRAVENOUS | Status: AC
  Administered 2012-04-13: 980 mg via INTRAVENOUS
  Filled 2012-04-13: qty 49

## 2012-04-13 NOTE — Progress Notes (Signed)
Hematology and Oncology Follow Up Visit  Betty [name] [id number] 1976/08/29 36 y.o. 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  2. History of profound neutropenia despite Neulasta on day 2.  3. BRCA1 positive.  Interim History:   Betty [name] is seen today unaccompanied for followup prior to her third of 4 planned cycles of neoadjuvant dose dense FEC.  She denies any fevers, chills, or night sweats.  She denies any nausea, or emesis issues. No diarrhea or constipation problems. Her energy level is recovering. She denies a diffuse bone pain.  She has noted expected alopecia. She denies any right breast masses or pain. A detailed review of systems is otherwise noncontributory as noted below.  Review of Systems: Constitutional:  no weight loss, fever, night sweats and feels well Eyes: no complaints ENT: no complaints Cardiovascular: no chest pain or dyspnea on exertion Respiratory: no cough, shortness of breath, or wheezing Neurological: no TIA or stroke symptoms Dermatological: negative Gastrointestinal: no abdominal pain, change in bowel habits, or black or bloody stools Genito-Urinary: no dysuria, trouble voiding, or hematuria Hematological and Lymphatic: negative Breast: negative Musculoskeletal: negative Remaining ROS negative.  Medications:   I have reviewed the patient's current medications.  Current Outpatient Prescriptions  Medication Sig Dispense Refill  . Chlorpheniramine-PSE-Ibuprofen (ADVIL ALLERGY SINUS PO) Take 1 tablet by mouth as needed. For allergies      . dexamethasone (DECADRON) 4 MG tablet Take 2 tablets by mouth once a day on the day after chemotherapy and then take 2 tablets two times a day for 2 days. Take with food.  30 tablet  1  . EPINEPHrine (EPIPEN)  0.3 mg/0.3 mL DEVI Inject 0.3 mg into the muscle once.        . lidocaine-prilocaine (EMLA) cream Apply topically as needed.  30 g  1  . LORazepam (ATIVAN) 0.5 MG tablet Take 1 tablet (0.5 mg total) by mouth every 6 (six) hours as needed (Nausea or vomiting).  30 tablet  0  . Multiple Vitamin (MULTIVITAMIN) tablet Take 1 tablet by mouth daily. chewable      . ondansetron (ZOFRAN) 8 MG tablet Take 1 tablet two times a day as needed for nausea or vomiting starting on the third day after chemotherapy.  30 tablet  1  . prochlorperazine (COMPAZINE) 10 MG tablet Take 1 tablet (10 mg total) by mouth every 6 (six) hours as needed (Nausea or vomiting).  30 tablet  1  . prochlorperazine (COMPAZINE) 25 MG suppository Place 1 suppository (25 mg total) rectally every 12 (twelve) hours as needed for nausea.  12 suppository  3    Allergies:  Allergies  Allergen Reactions  . Dextromethorphan Rash    Physical Exam: Filed Vitals:   04/13/12 1109  BP: 126/82  Pulse: 61  Temp: 98.1 F (36.7 C)    Body mass index is 25.15 kg/(m^2). Weight: 170 lbs. HEENT:  Sclerae anicteric, conjunctivae pink.  Oropharynx clear.  No mucositis or candidiasis.   Nodes:  No cervical, supraclavicular, or axillary lymphadenopathy palpated.  Breast Exam:  The right breast was examined, there is no evidence of any dominant mass effect, no nipple inversion or discharge and the axilla is clear.   Lungs:  Clear to auscultation bilaterally.  No crackles, rhonchi, or wheezes.   Heart:  Regular  rate and rhythm.   Abdomen:  Soft, nontender.  Positive bowel sounds.  No organomegaly or masses palpated.   Musculoskeletal:  No focal spinal tenderness to palpation.  Extremities:  Benign.  No peripheral edema or cyanosis.   Skin:  Benign.   Neuro:  Nonfocal, alert and oriented x 3.  Lab Results: Lab Results  Component Value Date   WBC 10.4* 04/13/2012   HGB 11.7 04/13/2012   HCT 34.4* 04/13/2012   MCV 85.6 04/13/2012   PLT 162  04/13/2012   NEUTROABS 7.9* 04/13/2012     Chemistry      Component Value Date/Time   NA 139 03/30/2012 1039   K 4.0 03/30/2012 1039   CL 108 03/30/2012 1039   CO2 24 03/30/2012 1039   BUN 10 03/30/2012 1039   CREATININE 0.77 03/30/2012 1039      Component Value Date/Time   CALCIUM 8.7 03/30/2012 1039   ALKPHOS 68 03/30/2012 1039   AST 22 03/30/2012 1039   ALT 22 03/30/2012 1039   BILITOT 0.2* 03/30/2012 1039      Lab Results  Component Value Date   LABCA2 22 03/03/2012   2D Echocardiogram: 03/08/12 Transthoracic Echocardiography Patient: Betty, [name] MR #: [id number] Study Date: 03/08/2012 Gender: F Age: 72 Height: 152.4cm Weight: 77.3kg BSA: 1.7m^2 Pt. Status: Room:  PERFORMING [facility] Cardiology, Ec ATTENDING Ilda Foil SONOGRAPHER Jeryl Columbia cc:  ------------------------------------------------------------ LV EF: 60% - 65%  ------------------------------------------------------------ Indications: 174.9.  ------------------------------------------------------------ Study Conclusions  Left ventricle: The cavity size was normal. Systolic function was normal. The estimated ejection fraction was in the range of 60% to 65%. Wall motion was normal; there were no regional wall motion abnormalities. Transthoracic echocardiography. M-mode, complete 2D, spectral Doppler, and color Doppler. Height: Height: 152.4cm. Height: 60in. Weight: Weight: 77.3kg. Weight: 170lb. Body mass index: BMI: 33.3kg/m^2. Body surface area: BSA: 1.39m^2. Patient status: Inpatient. Location: Echo laboratory.    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  2. History of mild afebrile neutropenia  3. BRCA1 positive  Case reviewed with Dr Pierce Crane.  Plan:    Betty [name] will receive day 1 cycle 3/4 planned neoadjuvant dose dense FEC with Neulasta on day 2. Pertaining to her dexamethasone dosing, she'll again take 4 mg by mouth twice a day on day 2 and 3, and then 4 mg in the a.m. on day 4. She will be seen in one week's time for nadir assessment, but knows to contact us sooner if the need should arise.  Of note, we will also refer her to Dr. Rayburn Ma for interim surgical followup. In he is he and her This plan was reviewed with the patient, who voices understanding and agreement.  She knows to call with any changes or problems.   Betty Gamblin T, PA-C 04/13/12

## 2012-04-13 NOTE — Telephone Encounter (Signed)
gve the pt her appt with dr blackmon at [facility]

## 2012-04-13 NOTE — Patient Instructions (Signed)
Patient aware of next appointment; discharged home with no complaints. 

## 2012-04-14 ENCOUNTER — Ambulatory Visit (HOSPITAL_BASED_OUTPATIENT_CLINIC_OR_DEPARTMENT_OTHER): Payer: BC Managed Care – PPO

## 2012-04-14 VITALS — BP 115/73 | HR 67 | Temp 97.6°F

## 2012-04-14 DIAGNOSIS — Z5189 Encounter for other specified aftercare: Secondary | ICD-10-CM

## 2012-04-14 DIAGNOSIS — Z171 Estrogen receptor negative status [ER-]: Secondary | ICD-10-CM

## 2012-04-14 DIAGNOSIS — C50519 Malignant neoplasm of lower-outer quadrant of unspecified female breast: Secondary | ICD-10-CM

## 2012-04-14 MED ORDER — PEGFILGRASTIM INJECTION 6 MG/0.6ML
6.0000 mg | Freq: Once | SUBCUTANEOUS | Status: AC
  Administered 2012-04-14: 6 mg via SUBCUTANEOUS
  Filled 2012-04-14: qty 0.6

## 2012-04-19 ENCOUNTER — Other Ambulatory Visit: Payer: Self-pay | Admitting: Physician Assistant

## 2012-04-19 DIAGNOSIS — C50519 Malignant neoplasm of lower-outer quadrant of unspecified female breast: Secondary | ICD-10-CM

## 2012-04-20 ENCOUNTER — Telehealth: Payer: Self-pay | Admitting: *Deleted

## 2012-04-20 ENCOUNTER — Ambulatory Visit (HOSPITAL_BASED_OUTPATIENT_CLINIC_OR_DEPARTMENT_OTHER): Payer: BC Managed Care – PPO | Admitting: Physician Assistant

## 2012-04-20 ENCOUNTER — Other Ambulatory Visit (HOSPITAL_BASED_OUTPATIENT_CLINIC_OR_DEPARTMENT_OTHER): Payer: BC Managed Care – PPO | Admitting: Lab

## 2012-04-20 ENCOUNTER — Encounter: Payer: Self-pay | Admitting: *Deleted

## 2012-04-20 VITALS — BP 113/69 | HR 66 | Temp 97.5°F | Ht 69.0 in | Wt 170.2 lb

## 2012-04-20 DIAGNOSIS — D709 Neutropenia, unspecified: Secondary | ICD-10-CM

## 2012-04-20 DIAGNOSIS — C50519 Malignant neoplasm of lower-outer quadrant of unspecified female breast: Secondary | ICD-10-CM

## 2012-04-20 LAB — CBC WITH DIFFERENTIAL/PLATELET
BASO%: 1.1 % (ref 0.0–2.0)
Basophils Absolute: 0 10*3/uL (ref 0.0–0.1)
EOS%: 0.4 % (ref 0.0–7.0)
HGB: 10.6 g/dL — ABNORMAL LOW (ref 11.6–15.9)
MCH: 29.7 pg (ref 25.1–34.0)
MCHC: 34.5 g/dL (ref 31.5–36.0)
MCV: 86 fL (ref 79.5–101.0)
MONO%: 8.2 % (ref 0.0–14.0)
NEUT%: 65.7 % (ref 38.4–76.8)
RDW: 13.7 % (ref 11.2–14.5)
lymph#: 0.7 10*3/uL — ABNORMAL LOW (ref 0.9–3.3)

## 2012-04-20 NOTE — Progress Notes (Signed)
Saw patient today in center.  She states she is doing well. No complaints or concerns voiced.  Nutrition class offered.  Patient stated she would love to attend but the distance is too great.  Appointment calendar given.  She understands to call with questions.

## 2012-04-20 NOTE — Telephone Encounter (Signed)
Made patient appiontment for 05-11-2012 and 05-25-2012 emailed michelle to set up patients treatment

## 2012-04-20 NOTE — Progress Notes (Signed)
Hematology and Oncology Follow Up Visit  Betty [name] [id number] [birth year] 36 y.o. 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  2. History of profound neutropenia despite Neulasta on day 2.  3. BRCA1 positive.  Interim History:   Betty [name] is seen today unaccompanied for followup after her third of 4 planned cycles of neoadjuvant dose dense FEC.  She denies any fevers, chills, or night sweats.  She denies any nausea, or emesis issues. No diarrhea or constipation problems. Her energy level is recovering. She denies a diffuse bone pain.   A detailed review of systems is otherwise noncontributory as noted below.  Review of Systems: Constitutional:  no weight loss, fever, night sweats and feels well Eyes: no complaints ENT: no complaints Cardiovascular: no chest pain or dyspnea on exertion Respiratory: no cough, shortness of breath, or wheezing Neurological: no TIA or stroke symptoms Dermatological: negative Gastrointestinal: no abdominal pain, change in bowel habits, or black or bloody stools Genito-Urinary: no dysuria, trouble voiding, or hematuria Hematological and Lymphatic: negative Breast: negative Musculoskeletal: negative Remaining ROS negative.  Medications:   I have reviewed the patient's current medications.  Current Outpatient Prescriptions  Medication Sig Dispense Refill  . Chlorpheniramine-PSE-Ibuprofen (ADVIL ALLERGY SINUS PO) Take 1 tablet by mouth as needed. For allergies      . dexamethasone (DECADRON) 4 MG tablet Take 2 tablets by mouth once a day on the day after chemotherapy and then take 2 tablets two times a day for 2 days. Take with food.  30 tablet  1  . EPINEPHrine (EPIPEN) 0.3 mg/0.3 mL DEVI Inject 0.3 mg into the muscle once.        .  lidocaine-prilocaine (EMLA) cream Apply topically as needed.  30 g  1  . LORazepam (ATIVAN) 0.5 MG tablet Take 1 tablet (0.5 mg total) by mouth every 6 (six) hours as needed (Nausea or vomiting).  30 tablet  0  . Multiple Vitamin (MULTIVITAMIN) tablet Take 1 tablet by mouth daily. chewable      . prochlorperazine (COMPAZINE) 10 MG tablet Take 1 tablet (10 mg total) by mouth every 6 (six) hours as needed (Nausea or vomiting).  30 tablet  1  . ondansetron (ZOFRAN) 8 MG tablet Take 1 tablet two times a day as needed for nausea or vomiting starting on the third day after chemotherapy.  30 tablet  1  . prochlorperazine (COMPAZINE) 25 MG suppository Place 1 suppository (25 mg total) rectally every 12 (twelve) hours as needed for nausea.  12 suppository  3    Allergies:  Allergies  Allergen Reactions  . Dextromethorphan Rash    Physical Exam: Filed Vitals:   04/20/12 1110  BP: 113/69  Pulse: 66  Temp: 97.5 F (36.4 C)    Body mass index is 25.13 kg/(m^2). Weight: 170 lbs. HEENT:  Sclerae anicteric, conjunctivae pink.  Oropharynx clear.  No mucositis or candidiasis.   Nodes:  No cervical, supraclavicular, or axillary lymphadenopathy palpated.  Breast Exam:  Deferred.   Lungs:  Clear to auscultation bilaterally.  No crackles, rhonchi, or wheezes.   Heart:  Regular rate and rhythm.   Abdomen:  Soft, nontender.  Positive bowel sounds.  No organomegaly or masses palpated.   Musculoskeletal:  No focal spinal tenderness to palpation.  Extremities:  Benign.  No peripheral  edema or cyanosis.   Skin:  Benign.   Neuro:  Nonfocal, alert and oriented x 3.  Lab Results: Lab Results  Component Value Date   WBC 3.0* 04/20/2012   HGB 10.6* 04/20/2012   HCT 30.9* 04/20/2012   MCV 86.0 04/20/2012   PLT 125* 04/20/2012   NEUTROABS 2.0 04/20/2012     Chemistry      Component Value Date/Time   NA 139 04/13/2012 1100   K 4.1 04/13/2012 1100   CL 106 04/13/2012 1100   CO2 24 04/13/2012 1100   BUN 13  04/13/2012 1100   CREATININE 0.79 04/13/2012 1100      Component Value Date/Time   CALCIUM 8.8 04/13/2012 1100   ALKPHOS 82 04/13/2012 1100   AST 16 04/13/2012 1100   ALT 14 04/13/2012 1100   BILITOT 0.4 04/13/2012 1100      Lab Results  Component Value Date   LABCA2 22 03/03/2012   2D Echocardiogram: 03/08/12 Transthoracic Echocardiography Patient: Betty, [name] MR #: [id number] Study Date: 03/08/2012 Gender: F Age: 44 Height: 152.4cm Weight: 77.3kg BSA: 1.20m^2 Pt. Status: Room:  PERFORMING [facility] Cardiology, Ec ATTENDING Ilda Foil SONOGRAPHER Jeryl Columbia cc:  ------------------------------------------------------------ LV EF: 60% - 65%  ------------------------------------------------------------ Indications: 174.9.  ------------------------------------------------------------ Study Conclusions  Left ventricle: The cavity size was normal. Systolic function was normal. The estimated ejection fraction was in the range of 60% to 65%. Wall motion was normal; there were no regional wall motion abnormalities. Transthoracic echocardiography. M-mode, complete 2D, spectral Doppler, and color Doppler. Height: Height: 152.4cm. Height: 60in. Weight: Weight: 77.3kg. Weight: 170lb. Body mass index: BMI: 33.3kg/m^2. Body surface area: BSA: 1.32m^2. Patient status: Inpatient. Location: Echo laboratory.    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  2. Mild afebrile neutropenia  3. BRCA1 positive  Case reviewed with Dr Pierce Crane.  Plan:  Betty [name] will return on 04/27/2012 for followup exam in anticipation of day 1 cycle 4/4 planned neoadjuvant dose dense FEC with Neulasta on day 2. After which we will obtain a bilateral breast MRI.  She knows to  contact us sooner if the need should arise. This plan was reviewed with the patient, who voices understanding and agreement.  She knows to call with any changes or problems.    Piers Baade T, PA-C 04/20/12

## 2012-04-21 ENCOUNTER — Telehealth: Payer: Self-pay | Admitting: *Deleted

## 2012-04-21 NOTE — Telephone Encounter (Signed)
Last entry on wrong patient. JMW

## 2012-04-21 NOTE — Telephone Encounter (Signed)
Per staff message I have scheduled appts. JMW  

## 2012-04-21 NOTE — Telephone Encounter (Signed)
Per patient phone call, I have moved her injection appt from today to Friday for her. Appt moved due patient hs another appt.  JMW

## 2012-04-22 ENCOUNTER — Telehealth: Payer: Self-pay | Admitting: *Deleted

## 2012-04-22 NOTE — Telephone Encounter (Signed)
Called patient to inform the patient of the mri of the breast appointment on 04-28-2012 arrival time 12:45pm patient confirmed over the phone the new date and time of the mri of breast

## 2012-04-27 ENCOUNTER — Telehealth: Payer: Self-pay | Admitting: *Deleted

## 2012-04-27 ENCOUNTER — Ambulatory Visit (HOSPITAL_BASED_OUTPATIENT_CLINIC_OR_DEPARTMENT_OTHER): Payer: BC Managed Care – PPO | Admitting: Physician Assistant

## 2012-04-27 ENCOUNTER — Ambulatory Visit (HOSPITAL_BASED_OUTPATIENT_CLINIC_OR_DEPARTMENT_OTHER): Payer: BC Managed Care – PPO

## 2012-04-27 ENCOUNTER — Other Ambulatory Visit (HOSPITAL_BASED_OUTPATIENT_CLINIC_OR_DEPARTMENT_OTHER): Payer: BC Managed Care – PPO | Admitting: Lab

## 2012-04-27 VITALS — BP 120/81 | HR 66 | Temp 98.3°F | Ht 69.0 in | Wt 171.8 lb

## 2012-04-27 DIAGNOSIS — Z1501 Genetic susceptibility to malignant neoplasm of breast: Secondary | ICD-10-CM

## 2012-04-27 DIAGNOSIS — Z5111 Encounter for antineoplastic chemotherapy: Secondary | ICD-10-CM

## 2012-04-27 DIAGNOSIS — Z1509 Genetic susceptibility to other malignant neoplasm: Secondary | ICD-10-CM

## 2012-04-27 DIAGNOSIS — C50519 Malignant neoplasm of lower-outer quadrant of unspecified female breast: Secondary | ICD-10-CM

## 2012-04-27 DIAGNOSIS — Z171 Estrogen receptor negative status [ER-]: Secondary | ICD-10-CM

## 2012-04-27 LAB — COMPREHENSIVE METABOLIC PANEL
AST: 18 U/L (ref 0–37)
Albumin: 4.1 g/dL (ref 3.5–5.2)
BUN: 12 mg/dL (ref 6–23)
CO2: 26 mEq/L (ref 19–32)
Calcium: 8.5 mg/dL (ref 8.4–10.5)
Chloride: 105 mEq/L (ref 96–112)
Creatinine, Ser: 0.77 mg/dL (ref 0.50–1.10)
Glucose, Bld: 113 mg/dL — ABNORMAL HIGH (ref 70–99)
Potassium: 4 mEq/L (ref 3.5–5.3)

## 2012-04-27 LAB — CBC WITH DIFFERENTIAL/PLATELET
EOS%: 0.1 % (ref 0.0–7.0)
MCH: 29.5 pg (ref 25.1–34.0)
MCV: 86.1 fL (ref 79.5–101.0)
MONO%: 5.6 % (ref 0.0–14.0)
NEUT#: 11.6 10*3/uL — ABNORMAL HIGH (ref 1.5–6.5)
RBC: 3.8 10*6/uL (ref 3.70–5.45)
RDW: 16.6 % — ABNORMAL HIGH (ref 11.2–14.5)
lymph#: 1.7 10*3/uL (ref 0.9–3.3)

## 2012-04-27 LAB — LACTATE DEHYDROGENASE: LDH: 189 U/L (ref 94–250)

## 2012-04-27 MED ORDER — SODIUM CHLORIDE 0.9 % IV SOLN
500.0000 mg/m2 | Freq: Once | INTRAVENOUS | Status: AC
  Administered 2012-04-27: 980 mg via INTRAVENOUS
  Filled 2012-04-27: qty 49

## 2012-04-27 MED ORDER — DEXAMETHASONE SODIUM PHOSPHATE 4 MG/ML IJ SOLN
12.0000 mg | Freq: Once | INTRAMUSCULAR | Status: AC
  Administered 2012-04-27: 12 mg via INTRAVENOUS

## 2012-04-27 MED ORDER — FLUOROURACIL CHEMO INJECTION 2.5 GM/50ML
500.0000 mg/m2 | Freq: Once | INTRAVENOUS | Status: AC
  Administered 2012-04-27: 950 mg via INTRAVENOUS
  Filled 2012-04-27: qty 19

## 2012-04-27 MED ORDER — SODIUM CHLORIDE 0.9 % IJ SOLN
10.0000 mL | INTRAMUSCULAR | Status: DC | PRN
  Administered 2012-04-27: 10 mL
  Filled 2012-04-27: qty 10

## 2012-04-27 MED ORDER — EPIRUBICIN HCL CHEMO IV INJECTION 200 MG/100ML
100.0000 mg/m2 | Freq: Once | INTRAVENOUS | Status: AC
  Administered 2012-04-27: 194 mg via INTRAVENOUS
  Filled 2012-04-27: qty 97

## 2012-04-27 MED ORDER — PALONOSETRON HCL INJECTION 0.25 MG/5ML
0.2500 mg | Freq: Once | INTRAVENOUS | Status: AC
  Administered 2012-04-27: 0.25 mg via INTRAVENOUS

## 2012-04-27 MED ORDER — SODIUM CHLORIDE 0.9 % IV SOLN
150.0000 mg | Freq: Once | INTRAVENOUS | Status: AC
  Administered 2012-04-27: 150 mg via INTRAVENOUS
  Filled 2012-04-27: qty 5

## 2012-04-27 MED ORDER — HEPARIN SOD (PORK) LOCK FLUSH 100 UNIT/ML IV SOLN
500.0000 [IU] | Freq: Once | INTRAVENOUS | Status: AC | PRN
  Administered 2012-04-27: 500 [IU]
  Filled 2012-04-27: qty 5

## 2012-04-27 MED ORDER — SODIUM CHLORIDE 0.9 % IV SOLN
Freq: Once | INTRAVENOUS | Status: AC
  Administered 2012-04-27: 12:00:00 via INTRAVENOUS

## 2012-04-27 NOTE — Telephone Encounter (Signed)
Made  Patient an appointment for 05-04-2012 at 11:15am printed out calendar and gave to the patient

## 2012-04-27 NOTE — Patient Instructions (Addendum)
1. You may use warm saline rinses:                                      1 teaspoon salt to 1 quart warm water--rinse and spit as needed. (gargle also if wanted)

## 2012-04-27 NOTE — Progress Notes (Signed)
Hematology and Oncology Follow Up Visit  LOYALTY ARENTZ [id number] [birth year] 36 y.o. 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  2. History of profound neutropenia despite Neulasta on day 2.  3. BRCA1 positive.  Interim History:   Betty [name] is seen today accompanied by her sister for followup prior to her fourth of 4 planned cycles of neoadjuvant dose dense FEC.  She denies any fevers, chills, or night sweats.  She denies any nausea, or emesis issues. No diarrhea or constipation problems. Her energy level is recovering. She denies a diffuse bone pain.  She has noted expected alopecia. She denies any right breast masses or pain.  Of note, she is scheduled for bilateral breast MRI on 04/28/2012, followup with Dr. Rayburn Ma on 04/29/2012. A detailed review of systems is otherwise noncontributory as noted below.  Review of Systems: Constitutional:  no weight loss, fever, night sweats and feels well Eyes: no complaints ENT: no complaints Cardiovascular: no chest pain or dyspnea on exertion Respiratory: no cough, shortness of breath, or wheezing Neurological: no TIA or stroke symptoms Dermatological: negative Gastrointestinal: no abdominal pain, change in bowel habits, or black or bloody stools Genito-Urinary: no dysuria, trouble voiding, or hematuria Hematological and Lymphatic: negative Breast: negative Musculoskeletal: negative Remaining ROS negative.  Medications:   I have reviewed the patient's current medications.  Current Outpatient Prescriptions  Medication Sig Dispense Refill  . Chlorpheniramine-PSE-Ibuprofen (ADVIL ALLERGY SINUS PO) Take 1 tablet by mouth as needed. For allergies      . dexamethasone (DECADRON) 4 MG tablet Take 2 tablets by mouth once a day on the day after  chemotherapy and then take 2 tablets two times a day for 2 days. Take with food.  30 tablet  1  . EPINEPHrine (EPIPEN) 0.3 mg/0.3 mL DEVI Inject 0.3 mg into the muscle once.        . lidocaine-prilocaine (EMLA) cream Apply topically as needed.  30 g  1  . LORazepam (ATIVAN) 0.5 MG tablet Take 1 tablet (0.5 mg total) by mouth every 6 (six) hours as needed (Nausea or vomiting).  30 tablet  0  . Multiple Vitamin (MULTIVITAMIN) tablet Take 1 tablet by mouth daily. chewable      . ondansetron (ZOFRAN) 8 MG tablet Take 1 tablet two times a day as needed for nausea or vomiting starting on the third day after chemotherapy.  30 tablet  1  . prochlorperazine (COMPAZINE) 10 MG tablet Take 1 tablet (10 mg total) by mouth every 6 (six) hours as needed (Nausea or vomiting).  30 tablet  1  . prochlorperazine (COMPAZINE) 25 MG suppository Place 1 suppository (25 mg total) rectally every 12 (twelve) hours as needed for nausea.  12 suppository  3   No current facility-administered medications for this visit.   Facility-Administered Medications Ordered in Other Visits  Medication Dose Route Frequency Provider Last Rate Last Dose  . 0.9 %  sodium chloride infusion   Intravenous Once Pierce Crane, MD      . cyclophosphamide (CYTOXAN) 980 mg in sodium chloride 0.9 % 250 mL chemo infusion  500 mg/m2 (Treatment Plan Actual) Intravenous Once Pierce Crane, MD      . dexamethasone (DECADRON) injection 12 mg  12 mg Intravenous Once Pierce Crane, MD      . epirubicin [facility])  chemo injection 194 mg  100 mg/m2 (Treatment Plan Actual) Intravenous Once Pierce Crane, MD      . fluorouracil (ADRUCIL) chemo injection 950 mg  500 mg/m2 (Treatment Plan Actual) Intravenous Once Pierce Crane, MD      . fosaprepitant (EMEND) 150 mg in sodium chloride 0.9 % 145 mL IVPB  150 mg Intravenous Once Pierce Crane, MD      . heparin lock flush 100 unit/mL  500 Units Intracatheter Once PRN Pierce Crane, MD      . palonosetron (ALOXI) injection 0.25 mg   0.25 mg Intravenous Once Pierce Crane, MD      . sodium chloride 0.9 % injection 10 mL  10 mL Intracatheter PRN Pierce Crane, MD        Allergies:  Allergies  Allergen Reactions  . Dextromethorphan Rash    Physical Exam: Filed Vitals:   04/27/12 1127  BP: 120/81  Pulse: 66  Temp: 98.3 F (36.8 C)    Body mass index is 25.37 kg/(m^2). Weight: 171 lbs. HEENT:  Sclerae anicteric, conjunctivae pink.  Oropharynx clear.  No mucositis or candidiasis.   Nodes:  No cervical, supraclavicular, or axillary lymphadenopathy palpated.  Breast Exam:  Deferred. Lungs:  Clear to auscultation bilaterally.  No crackles, rhonchi, or wheezes.   Heart:  Regular rate and rhythm.   Abdomen:  Soft, nontender.  Positive bowel sounds.  No organomegaly or masses palpated.   Musculoskeletal:  No focal spinal tenderness to palpation.  Extremities:  Benign.  No peripheral edema or cyanosis.   Skin:  Benign.   Neuro:  Nonfocal, alert and oriented x 3.  Lab Results: Lab Results  Component Value Date   WBC 14.2* 04/27/2012   HGB 11.2* 04/27/2012   HCT 32.7* 04/27/2012   MCV 86.1 04/27/2012   PLT 159 04/27/2012   NEUTROABS 11.6* 04/27/2012     Chemistry      Component Value Date/Time   NA 139 04/13/2012 1100   K 4.1 04/13/2012 1100   CL 106 04/13/2012 1100   CO2 24 04/13/2012 1100   BUN 13 04/13/2012 1100   CREATININE 0.79 04/13/2012 1100      Component Value Date/Time   CALCIUM 8.8 04/13/2012 1100   ALKPHOS 82 04/13/2012 1100   AST 16 04/13/2012 1100   ALT 14 04/13/2012 1100   BILITOT 0.4 04/13/2012 1100      Lab Results  Component Value Date   LABCA2 22 03/03/2012   2D Echocardiogram: 03/08/12 Transthoracic Echocardiography Patient: Betty [name], Betty [name] MR #: [id number] Study Date: 03/08/2012 Gender: F Age: 39 Height: 152.4cm Weight: 77.3kg BSA: 1.69m^2 Pt. Status: Room:  PERFORMING [facility] Cardiology, Ec ATTENDING Ilda Foil SONOGRAPHER Jeryl Columbia cc:  ------------------------------------------------------------ LV EF: 60% - 65%  ------------------------------------------------------------ Indications: 174.9.  ------------------------------------------------------------ Study Conclusions  Left ventricle: The cavity size was normal. Systolic function was normal. The estimated ejection fraction was in the range of 60% to 65%. Wall motion was normal; there were no regional wall motion abnormalities. Transthoracic echocardiography. M-mode, complete 2D, spectral Doppler, and color Doppler. Height: Height: 152.4cm. Height: 60in. Weight: Weight: 77.3kg. Weight: 170lb. Body mass index: BMI: 33.3kg/m^2. Body surface area: BSA: 1.79m^2. Patient status: Inpatient. Location: Echo laboratory.    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  2. History of mild afebrile  neutropenia  3. BRCA1 positive  Case reviewed with Dr Pierce Crane.  Plan:  Betty [name] will receive day 1 cycle 4/4 planned neoadjuvant dose dense FEC with Neulasta on day 2.  As noted, she is a bilateral breast MRI scheduled for tomorrow, and followup with Dr. Rayburn Ma on 04/29/2012. She will be seen in one week's time for nadir assessment, but knows to contact us sooner if the need should arise.  This plan was reviewed with the patient, who voices understanding and agreement.  She knows to call with any changes or problems.   Betty [name] T, PA-C 04/27/12

## 2012-04-28 ENCOUNTER — Ambulatory Visit (HOSPITAL_BASED_OUTPATIENT_CLINIC_OR_DEPARTMENT_OTHER): Payer: BC Managed Care – PPO

## 2012-04-28 ENCOUNTER — Ambulatory Visit (HOSPITAL_COMMUNITY)
Admission: RE | Admit: 2012-04-28 | Discharge: 2012-04-28 | Disposition: A | Discharge status: Home / Self Care | Payer: BC Managed Care – PPO | Source: Ambulatory Visit | Attending: Physician Assistant | Admitting: Physician Assistant

## 2012-04-28 VITALS — BP 121/68 | HR 65 | Temp 97.4°F

## 2012-04-28 DIAGNOSIS — Z5189 Encounter for other specified aftercare: Secondary | ICD-10-CM

## 2012-04-28 DIAGNOSIS — C50519 Malignant neoplasm of lower-outer quadrant of unspecified female breast: Secondary | ICD-10-CM

## 2012-04-28 DIAGNOSIS — Z171 Estrogen receptor negative status [ER-]: Secondary | ICD-10-CM

## 2012-04-28 MED ORDER — PEGFILGRASTIM INJECTION 6 MG/0.6ML
6.0000 mg | Freq: Once | SUBCUTANEOUS | Status: AC
  Administered 2012-04-28: 6 mg via SUBCUTANEOUS
  Filled 2012-04-28: qty 0.6

## 2012-04-28 MED ORDER — GADOBENATE DIMEGLUMINE 529 MG/ML IV SOLN
20.0000 mL | Freq: Once | INTRAVENOUS | Status: AC | PRN
  Administered 2012-04-28: 17 mL via INTRAVENOUS

## 2012-04-29 ENCOUNTER — Ambulatory Visit (INDEPENDENT_AMBULATORY_CARE_PROVIDER_SITE_OTHER): Payer: BC Managed Care – PPO | Admitting: Surgery

## 2012-04-29 ENCOUNTER — Other Ambulatory Visit (INDEPENDENT_AMBULATORY_CARE_PROVIDER_SITE_OTHER): Payer: Self-pay | Admitting: Surgery

## 2012-04-29 ENCOUNTER — Encounter (INDEPENDENT_AMBULATORY_CARE_PROVIDER_SITE_OTHER): Payer: Self-pay | Admitting: Surgery

## 2012-04-29 VITALS — BP 128/72 | HR 76 | Temp 98.0°F | Resp 12 | Ht 70.0 in | Wt 172.4 lb

## 2012-04-29 DIAGNOSIS — Z9889 Other specified postprocedural states: Secondary | ICD-10-CM

## 2012-04-29 DIAGNOSIS — Z09 Encounter for follow-up examination after completed treatment for conditions other than malignant neoplasm: Secondary | ICD-10-CM

## 2012-04-29 NOTE — Progress Notes (Signed)
Subjective:     Patient ID: Betty [name], female   DOB: 02/28/1976, 36 y.o.   MRN: [id number]  HPI She is here for a followup visit of her breast cancer and Port-A-Cath. She is currently undergoing chemotherapy. She has been doing very well and has no complaints.  Review of Systems     Objective:   Physical Exam On exam, the right breast cancer is no longer palpable. There is no axillary adenopathy. MRI of the breast shows a significant decrease in size of the right breast cancer. She is BRCA1 positive    Assessment:     Right breast cancer currently undergoing neoadjuvant therapy    Plan:     I am going to go ahead and refer her to plastic surgery as she is interested in bilateral mastectomies and reconstruction whenever she is finished her therapy. I will see her back to schedule her surgery at the end of her therapy.

## 2012-04-30 ENCOUNTER — Encounter: Payer: Self-pay | Admitting: Oncology

## 2012-05-04 ENCOUNTER — Other Ambulatory Visit: Payer: BC Managed Care – PPO | Admitting: Lab

## 2012-05-04 ENCOUNTER — Other Ambulatory Visit (HOSPITAL_BASED_OUTPATIENT_CLINIC_OR_DEPARTMENT_OTHER): Payer: BC Managed Care – PPO | Admitting: Lab

## 2012-05-04 ENCOUNTER — Ambulatory Visit: Payer: BC Managed Care – PPO | Admitting: Physician Assistant

## 2012-05-04 ENCOUNTER — Ambulatory Visit (HOSPITAL_BASED_OUTPATIENT_CLINIC_OR_DEPARTMENT_OTHER): Payer: BC Managed Care – PPO | Admitting: Physician Assistant

## 2012-05-04 VITALS — BP 103/70 | HR 74 | Temp 98.4°F | Ht 70.0 in | Wt 172.3 lb

## 2012-05-04 DIAGNOSIS — C50919 Malignant neoplasm of unspecified site of unspecified female breast: Secondary | ICD-10-CM

## 2012-05-04 DIAGNOSIS — Z1501 Genetic susceptibility to malignant neoplasm of breast: Secondary | ICD-10-CM

## 2012-05-04 DIAGNOSIS — C50519 Malignant neoplasm of lower-outer quadrant of unspecified female breast: Secondary | ICD-10-CM

## 2012-05-04 DIAGNOSIS — D709 Neutropenia, unspecified: Secondary | ICD-10-CM

## 2012-05-04 LAB — CBC WITH DIFFERENTIAL/PLATELET
BASO%: 0.8 % (ref 0.0–2.0)
EOS%: 0.5 % (ref 0.0–7.0)
LYMPH%: 20.8 % (ref 14.0–49.7)
MCH: 29 pg (ref 25.1–34.0)
MCHC: 34.4 g/dL (ref 31.5–36.0)
MONO#: 0.3 10*3/uL (ref 0.1–0.9)
RBC: 3.34 10*6/uL — ABNORMAL LOW (ref 3.70–5.45)
WBC: 3.7 10*3/uL — ABNORMAL LOW (ref 3.9–10.3)
lymph#: 0.8 10*3/uL — ABNORMAL LOW (ref 0.9–3.3)
nRBC: 0 % (ref 0–0)

## 2012-05-04 NOTE — Progress Notes (Signed)
Hematology and Oncology Follow Up Visit  Betty [name] [id number] 01/20/1976 36 y.o. 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  2. History of profound neutropenia despite Neulasta on day 2.  3. BRCA1 positive.  Interim History:   Betty [name] is seen today unaccompanied for followup after her fourth of 4 planned cycles of neoadjuvant dose dense FEC.  She denies any fevers, chills, or night sweats.  She denies any nausea, or emesis issues. No diarrhea or constipation problems, but has noted some heartburn.  Her energy level is recovering. She denies a diffuse bone pain.  She had interim breast MRI obtained last week. A detailed review of systems is otherwise noncontributory as noted below.  Review of Systems: Constitutional:  no weight loss, fever, night sweats and feels well Eyes: no complaints ENT: no complaints Cardiovascular: no chest pain or dyspnea on exertion Respiratory: no cough, shortness of breath, or wheezing Neurological: no TIA or stroke symptoms Dermatological: negative Gastrointestinal: no abdominal pain, change in bowel habits, or black or bloody stools Genito-Urinary: no dysuria, trouble voiding, or hematuria Hematological and Lymphatic: negative Breast: negative Musculoskeletal: negative Remaining ROS negative.  Medications:   I have reviewed the patient's current medications.  Current Outpatient Prescriptions  Medication Sig Dispense Refill  . Chlorpheniramine-PSE-Ibuprofen (ADVIL ALLERGY SINUS PO) Take 1 tablet by mouth as needed. For allergies      . dexamethasone (DECADRON) 4 MG tablet Take 2 tablets by mouth once a day on the day after chemotherapy and then take 2 tablets two times a day for 2 days. Take with food.  30 tablet  1  . EPINEPHrine (EPIPEN) 0.3  mg/0.3 mL DEVI Inject 0.3 mg into the muscle once.        . lidocaine-prilocaine (EMLA) cream Apply topically as needed.  30 g  1  . LORazepam (ATIVAN) 0.5 MG tablet Take 1 tablet (0.5 mg total) by mouth every 6 (six) hours as needed (Nausea or vomiting).  30 tablet  0  . Multiple Vitamin (MULTIVITAMIN) tablet Take 1 tablet by mouth daily. chewable      . ondansetron (ZOFRAN) 8 MG tablet Take 1 tablet two times a day as needed for nausea or vomiting starting on the third day after chemotherapy.  30 tablet  1  . prochlorperazine (COMPAZINE) 10 MG tablet Take 1 tablet (10 mg total) by mouth every 6 (six) hours as needed (Nausea or vomiting).  30 tablet  1  . prochlorperazine (COMPAZINE) 25 MG suppository Place 1 suppository (25 mg total) rectally every 12 (twelve) hours as needed for nausea.  12 suppository  3    Allergies:  Allergies  Allergen Reactions  . Dextromethorphan Rash    Physical Exam: Filed Vitals:   05/04/12 1119  BP: 103/70  Pulse: 74  Temp: 98.4 F (36.9 C)    Body mass index is 24.72 kg/(m^2). Weight: 172 lbs. HEENT:  Sclerae anicteric, conjunctivae pink.  Oropharynx clear.  No mucositis or candidiasis.   Nodes:  No cervical, supraclavicular, or axillary lymphadenopathy palpated.  Breast Exam:  Deferred.   Lungs:  Clear to auscultation bilaterally.  No crackles, rhonchi, or wheezes.   Heart:  Regular rate and rhythm.   Abdomen:  Soft, nontender.  Positive bowel sounds.  No organomegaly or masses palpated.   Musculoskeletal:  No focal spinal tenderness to palpation.  Extremities:  Benign.  No peripheral edema or cyanosis.   Skin:  Benign.   Neuro:  Nonfocal, alert and oriented x 3.  Lab Results: Lab Results  Component Value Date   WBC 3.7* 05/04/2012   HGB 9.7* 05/04/2012   HCT 28.2* 05/04/2012   MCV 84.4 05/04/2012   PLT 135* 05/04/2012   NEUTROABS 2.6 05/04/2012     Chemistry      Component Value Date/Time   NA 139 04/27/2012 1051   K 4.0 04/27/2012 1051   CL 105  04/27/2012 1051   CO2 26 04/27/2012 1051   BUN 12 04/27/2012 1051   CREATININE 0.77 04/27/2012 1051      Component Value Date/Time   CALCIUM 8.5 04/27/2012 1051   ALKPHOS 87 04/27/2012 1051   AST 18 04/27/2012 1051   ALT 22 04/27/2012 1051   BILITOT 0.4 04/27/2012 1051      Lab Results  Component Value Date   LABCA2 22 03/03/2012  Radiographic data: 04/28/12 BILATERAL BREAST MRI WITH AND WITHOUT CONTRAST  Technique: Multiplanar, multisequence MR images of both breasts  were obtained prior to and following the intravenous administration  of 17ml of Multihance. Three dimensional images were evaluated at  the independent DynaCad workstation.  Comparison: 02/27/2012 breast MRI.  Findings: The irregular enhancing mass located within the posterior  third of the right breast at the 8 o'clock position has decreased  in size and the intensity of enhancement has decreased. The mass  now measures 1.2 x 0.9 x 0.9 cm in size and previously measured 2.2  x 1.6 x 1.9 cm in size. There are no new worrisome enhancing foci.  There is no evidence for axillary or internal mammary adenopathy  and there are no additional findings.  IMPRESSION:  Interval decrease in size and intensity of enhancement associated  with the known right breast carcinoma located at the 8 o'clock  position. The mass now measures 1.2 x 0.9 x 0.9 cm in size  (previously 2.2 x 1.6 x 1.9 cm in size). No additional findings.  RECOMMENDATION:  Treatment plan  THREE-DIMENSIONAL MR IMAGE RENDERING ON INDEPENDENT WORKSTATION:  Three-dimensional MR images were rendered by post-processing of the  original MR data on an independent workstation. The three-  dimensional MR images were interpreted, and findings were reported  in the accompanying complete MRI report for this study.  BI-RADS CATEGORY 6: Known biopsy-proven malignancy - appropriate  action should be taken.  Original Report Authenticated By: Rolla Plate, M.D.     2D  Echocardiogram: 03/08/12 Transthoracic Echocardiography Patient: Betty [name], Betty [name] MR #: [id number] Study Date: 03/08/2012 Gender: F Age: 16 Height: 152.4cm Weight: 77.3kg BSA: 1.41m^2 Pt. Status: Room:  PERFORMING [facility] Cardiology, Ec ATTENDING Ilda Foil SONOGRAPHER Jeryl [place] cc:  ------------------------------------------------------------ LV EF: 60% - 65%  ------------------------------------------------------------ Indications: 174.9.  ------------------------------------------------------------ Study Conclusions  Left ventricle: The cavity size was normal. Systolic function was normal. The estimated ejection fraction was in the range of 60% to 65%. Wall motion was normal; there were no regional wall motion abnormalities. Transthoracic echocardiography. M-mode, complete 2D, spectral Doppler, and color Doppler. Height: Height: 152.4cm. Height: 60in. Weight: Weight: 77.3kg. Weight: 170lb. Body mass index: BMI: 33.3kg/m^2. Body surface area: BSA: 1.21m^2. Patient status: Inpatient. Location: Echo laboratory.    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  2. Mild afebrile neutropenia  3. BRCA1 positive  Case reviewed with Dr Pierce Crane, who also spoke with patient.  Plan:  Betty [name] will return on 05/11/2012 for followup exam in anticipation of day 1 cycle 1/4 planned neoadjuvant dose denseTaxotere with Neulasta on day 2. Side effect profile was discussed, questions were answered.  Betty [name] has been written instructions pertaining to dexamethasone protocol with Taxotere.  She was also instructed to obtain Pepcid AC, and use as directed. This plan was reviewed with the patient, who voices understanding and agreement.  She knows to call with any  changes or problems.   Paden Senger T, PA-C 05/04/12

## 2012-05-04 NOTE — Patient Instructions (Addendum)
1. Pertaining to Decadron for upcoming Taxotere on 05/11/12:                                                                                                 -Starting on Monday 05/10/12, Decadron 4mg  tabs , take 2 (8mg ) in the AM and PM                                                                                                -On 05/11/12, 8mg  in AM and PM                                                                                                -On 05/12/12, 8mg  in AM and PM                                                                                                -Then STOP 2. You will still be getting Neulasta on day 2, therefore, use Claritin  3. For heartburn: PEPCID AC - take as directed daily.

## 2012-05-11 ENCOUNTER — Other Ambulatory Visit (HOSPITAL_BASED_OUTPATIENT_CLINIC_OR_DEPARTMENT_OTHER): Payer: BC Managed Care – PPO | Admitting: Lab

## 2012-05-11 ENCOUNTER — Ambulatory Visit (HOSPITAL_BASED_OUTPATIENT_CLINIC_OR_DEPARTMENT_OTHER): Payer: BC Managed Care – PPO | Admitting: Physician Assistant

## 2012-05-11 ENCOUNTER — Ambulatory Visit (HOSPITAL_BASED_OUTPATIENT_CLINIC_OR_DEPARTMENT_OTHER): Payer: BC Managed Care – PPO

## 2012-05-11 VITALS — BP 115/74 | HR 64 | Temp 96.9°F

## 2012-05-11 VITALS — BP 112/74 | HR 63 | Temp 98.2°F | Ht 70.0 in | Wt 173.4 lb

## 2012-05-11 DIAGNOSIS — Z1501 Genetic susceptibility to malignant neoplasm of breast: Secondary | ICD-10-CM

## 2012-05-11 DIAGNOSIS — C50519 Malignant neoplasm of lower-outer quadrant of unspecified female breast: Secondary | ICD-10-CM

## 2012-05-11 DIAGNOSIS — Z5111 Encounter for antineoplastic chemotherapy: Secondary | ICD-10-CM

## 2012-05-11 DIAGNOSIS — Z171 Estrogen receptor negative status [ER-]: Secondary | ICD-10-CM

## 2012-05-11 LAB — CBC WITH DIFFERENTIAL/PLATELET
BASO%: 0.2 % (ref 0.0–2.0)
Basophils Absolute: 0.1 10*3/uL (ref 0.0–0.1)
EOS%: 0 % (ref 0.0–7.0)
HCT: 30.6 % — ABNORMAL LOW (ref 34.8–46.6)
HGB: 10.2 g/dL — ABNORMAL LOW (ref 11.6–15.9)
LYMPH%: 4.1 % — ABNORMAL LOW (ref 14.0–49.7)
MCH: 29.4 pg (ref 25.1–34.0)
MCHC: 33.3 g/dL (ref 31.5–36.0)
MCV: 88.2 fL (ref 79.5–101.0)
MONO%: 2.8 % (ref 0.0–14.0)
NEUT%: 92.9 % — ABNORMAL HIGH (ref 38.4–76.8)
lymph#: 1.2 10*3/uL (ref 0.9–3.3)

## 2012-05-11 LAB — COMPREHENSIVE METABOLIC PANEL
AST: 33 U/L (ref 0–37)
Alkaline Phosphatase: 92 U/L (ref 39–117)
BUN: 13 mg/dL (ref 6–23)
Creatinine, Ser: 0.7 mg/dL (ref 0.50–1.10)
Glucose, Bld: 100 mg/dL — ABNORMAL HIGH (ref 70–99)
Potassium: 4.1 mEq/L (ref 3.5–5.3)
Total Bilirubin: 0.7 mg/dL (ref 0.3–1.2)

## 2012-05-11 MED ORDER — HEPARIN SOD (PORK) LOCK FLUSH 100 UNIT/ML IV SOLN
500.0000 [IU] | Freq: Once | INTRAVENOUS | Status: AC | PRN
  Administered 2012-05-11: 500 [IU]
  Filled 2012-05-11: qty 5

## 2012-05-11 MED ORDER — ONDANSETRON 8 MG/50ML IVPB (CHCC)
8.0000 mg | Freq: Once | INTRAVENOUS | Status: AC
  Administered 2012-05-11: 8 mg via INTRAVENOUS

## 2012-05-11 MED ORDER — SODIUM CHLORIDE 0.9 % IJ SOLN
10.0000 mL | INTRAMUSCULAR | Status: DC | PRN
  Administered 2012-05-11: 10 mL
  Filled 2012-05-11: qty 10

## 2012-05-11 MED ORDER — SODIUM CHLORIDE 0.9 % IV SOLN
Freq: Once | INTRAVENOUS | Status: AC
  Administered 2012-05-11: 13:00:00 via INTRAVENOUS

## 2012-05-11 MED ORDER — DOCETAXEL CHEMO INJECTION 160 MG/16ML
75.0000 mg/m2 | Freq: Once | INTRAVENOUS | Status: AC
  Administered 2012-05-11: 150 mg via INTRAVENOUS
  Filled 2012-05-11: qty 15

## 2012-05-11 MED ORDER — DEXAMETHASONE SODIUM PHOSPHATE 10 MG/ML IJ SOLN
10.0000 mg | Freq: Once | INTRAMUSCULAR | Status: AC
  Administered 2012-05-11: 10 mg via INTRAVENOUS

## 2012-05-11 MED ORDER — LORAZEPAM 2 MG/ML IJ SOLN
0.5000 mg | Freq: Once | INTRAMUSCULAR | Status: AC
  Administered 2012-05-11: 0.5 mg via INTRAVENOUS

## 2012-05-11 NOTE — Progress Notes (Signed)
Hematology and Oncology Follow Up Visit  Betty [name] [id number] 03/21/1976 36 y.o. 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  2. History of mild afebrile neutropenia.  3. BRCA1 positive   Interim History:   Betty [name] is seen today unaccompanied for followup prior to day 1 cycle 1/4 neoadjuvant dose dense Taxotere.  She has premedicated with dexamethasone as per Taxotere protocol.   She denies any fevers, chills, or night sweats.  She denies any nausea, or emesis issues. No diarrhea or constipation problems. Her energy level is recovering. She denies a diffuse bone pain.   A detailed review of systems is otherwise noncontributory as noted below.  Review of Systems: Constitutional:  no weight loss, fever, night sweats and feels well Eyes: no complaints ENT: no complaints Cardiovascular: no chest pain or dyspnea on exertion Respiratory: no cough, shortness of breath, or wheezing Neurological: no TIA or stroke symptoms Dermatological: negative Gastrointestinal: no abdominal pain, change in bowel habits, or black or bloody stools Genito-Urinary: no dysuria, trouble voiding, or hematuria Hematological and Lymphatic: negative Breast: negative Musculoskeletal: negative Remaining ROS negative.  Medications:   I have reviewed the patient's current medications.  Current Outpatient Prescriptions  Medication Sig Dispense Refill  . Chlorpheniramine-PSE-Ibuprofen (ADVIL ALLERGY SINUS PO) Take 1 tablet by mouth as needed. For allergies      . dexamethasone (DECADRON) 4 MG tablet Take 2 tablets by mouth once a day on the day after chemotherapy and then take 2 tablets two times a day for 2 days. Take with  food.  30 tablet  1  . EPINEPHrine (EPIPEN) 0.3 mg/0.3 mL DEVI Inject 0.3 mg into the muscle once.        . lidocaine-prilocaine (EMLA) cream Apply topically as needed.  30 g  1  . LORazepam (ATIVAN) 0.5 MG tablet Take 1 tablet (0.5 mg total) by mouth every 6 (six) hours as needed (Nausea or vomiting).  30 tablet  0  . Multiple Vitamin (MULTIVITAMIN) tablet Take 1 tablet by mouth daily. chewable      . ondansetron (ZOFRAN) 8 MG tablet Take 1 tablet two times a day as needed for nausea or vomiting starting on the third day after chemotherapy.  30 tablet  1  . prochlorperazine (COMPAZINE) 10 MG tablet Take 1 tablet (10 mg total) by mouth every 6 (six) hours as needed (Nausea or vomiting).  30 tablet  1  . prochlorperazine (COMPAZINE) 25 MG suppository Place 1 suppository (25 mg total) rectally every 12 (twelve) hours as needed for nausea.  12 suppository  3    Allergies:  Allergies  Allergen Reactions  . Dextromethorphan Rash    Physical Exam: Filed Vitals:   05/11/12 1145  BP: 112/74  Pulse: 63  Temp: 98.2 F (36.8 C)    Body mass index is 24.88 kg/(m^2). Weight: 173 lbs. HEENT:  Sclerae anicteric, conjunctivae pink.  Oropharynx clear.  No mucositis or candidiasis.   Nodes:  No cervical, supraclavicular, or axillary lymphadenopathy palpated.  Breast Exam:  Deferred.   Lungs:  Clear to auscultation bilaterally.  No crackles, rhonchi, or wheezes.   Heart:  Regular rate and rhythm.   Abdomen:  Soft, nontender.  Positive bowel sounds.  No  organomegaly or masses palpated.   Musculoskeletal:  No focal spinal tenderness to palpation.  Extremities:  Benign.  No peripheral edema or cyanosis.   Skin:  Benign.   Neuro:  Nonfocal, alert and oriented x 3.  Lab Results: Lab Results  Component Value Date   WBC 28.8* 05/11/2012   HGB 10.2* 05/11/2012   HCT 30.6* 05/11/2012   MCV 88.2 05/11/2012   PLT 184 05/11/2012   NEUTROABS 26.8* 05/11/2012     Chemistry      Component Value Date/Time   NA 139  04/27/2012 1051   K 4.0 04/27/2012 1051   CL 105 04/27/2012 1051   CO2 26 04/27/2012 1051   BUN 12 04/27/2012 1051   CREATININE 0.77 04/27/2012 1051      Component Value Date/Time   CALCIUM 8.5 04/27/2012 1051   ALKPHOS 87 04/27/2012 1051   AST 18 04/27/2012 1051   ALT 22 04/27/2012 1051   BILITOT 0.4 04/27/2012 1051      Lab Results  Component Value Date   LABCA2 22 03/03/2012  Radiographic data: 04/28/12 BILATERAL BREAST MRI WITH AND WITHOUT CONTRAST  Technique: Multiplanar, multisequence MR images of both breasts  were obtained prior to and following the intravenous administration  of 17ml of Multihance. Three dimensional images were evaluated at  the independent DynaCad workstation.  Comparison: 02/27/2012 breast MRI.  Findings: The irregular enhancing mass located within the posterior  third of the right breast at the 8 o'clock position has decreased  in size and the intensity of enhancement has decreased. The mass  now measures 1.2 x 0.9 x 0.9 cm in size and previously measured 2.2  x 1.6 x 1.9 cm in size. There are no new worrisome enhancing foci.  There is no evidence for axillary or internal mammary adenopathy  and there are no additional findings.  IMPRESSION:  Interval decrease in size and intensity of enhancement associated  with the known right breast carcinoma located at the 8 o'clock  position. The mass now measures 1.2 x 0.9 x 0.9 cm in size  (previously 2.2 x 1.6 x 1.9 cm in size). No additional findings.  RECOMMENDATION:  Treatment plan  THREE-DIMENSIONAL MR IMAGE RENDERING ON INDEPENDENT WORKSTATION:  Three-dimensional MR images were rendered by post-processing of the  original MR data on an independent workstation. The three-  dimensional MR images were interpreted, and findings were reported  in the accompanying complete MRI report for this study.  BI-RADS CATEGORY 6: Known biopsy-proven malignancy - appropriate  action should be taken.  Original Report  Authenticated By: Rolla Plate, M.D.     2D Echocardiogram: 03/08/12 Transthoracic Echocardiography Patient: Betty [name] MR #: [id number] Study Date: 03/08/2012 Gender: F Age: 53 Height: 152.4cm Weight: 77.3kg BSA: 1.55m^2 Pt. Status: Room:  PERFORMING [facility] Cardiology, Ec ATTENDING Ilda Foil SONOGRAPHER Jeryl [place] cc:  ------------------------------------------------------------ LV EF: 60% - 65%  ------------------------------------------------------------ Indications: 174.9.  ------------------------------------------------------------ Study Conclusions  Left ventricle: The cavity size was normal. Systolic function was normal. The estimated ejection fraction was in the range of 60% to 65%. Wall motion was normal; there were no regional wall motion abnormalities. Transthoracic echocardiography. M-mode, complete 2D, spectral Doppler, and color Doppler. Height: Height: 152.4cm. Height: 60in. Weight: Weight: 77.3kg. Weight: 170lb. Body mass index: BMI: 33.3kg/m^2. Body surface area: BSA: 1.60m^2. Patient status: Inpatient. Location: Echo laboratory.    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  2. History of mild afebrile neutropenia, which has resolved.  3. BRCA1 positive  Case reviewed with Dr Pierce Crane.  Plan:  Betty [name] will receive day 1 cycle 1/4 planned neoadjuvant dose dense Taxotere with Neulasta on day 2 today as scheduled. I will see her in 1 weeks time for assessment. This plan was reviewed with the patient, who voices understanding and agreement.  She knows to call with any changes or problems.   Phuc Kluttz T,  PA-C 05/11/12

## 2012-05-11 NOTE — Patient Instructions (Addendum)
[facility] Discharge Instructions for Patients Receiving Chemotherapy  Today you received the following chemotherapy agents Taxotere.  To help prevent nausea and vomiting after your treatment, we encourage you to take your nausea medication as ordered per MD.    If you develop nausea and vomiting that is not controlled by your nausea medication, call the clinic. If it is after clinic hours your family physician or the after hours number for the clinic or go to the Emergency Department.   BELOW ARE SYMPTOMS THAT SHOULD BE REPORTED IMMEDIATELY:  *FEVER GREATER THAN 100.5 F  *CHILLS WITH OR WITHOUT FEVER  NAUSEA AND VOMITING THAT IS NOT CONTROLLED WITH YOUR NAUSEA MEDICATION  *UNUSUAL SHORTNESS OF BREATH  *UNUSUAL BRUISING OR BLEEDING  TENDERNESS IN MOUTH AND THROAT WITH OR WITHOUT PRESENCE OF ULCERS  *URINARY PROBLEMS  *BOWEL PROBLEMS  UNUSUAL RASH Items with * indicate a potential emergency and should be followed up as soon as possible.  One of the nurses will contact you 24 hours after your treatment. Please let the nurse know about any problems that you may have experienced. Feel free to call the clinic you have any questions or concerns. The clinic phone number is [phone].   I have been informed and understand all the instructions given to me. I know to contact the clinic, my physician, or go to the Emergency Department if any problems should occur. I do not have any questions at this time, but understand that I may call the clinic during office hours   should I have any questions or need assistance in obtaining follow up care.    __________________________________________  _____________  __________ Signature of Patient or Authorized Representative            Date                   Time    __________________________________________ Nurse's Signature

## 2012-05-12 ENCOUNTER — Telehealth: Payer: Self-pay | Admitting: *Deleted

## 2012-05-12 ENCOUNTER — Ambulatory Visit (HOSPITAL_BASED_OUTPATIENT_CLINIC_OR_DEPARTMENT_OTHER): Payer: BC Managed Care – PPO

## 2012-05-12 VITALS — BP 107/70 | HR 64 | Temp 97.4°F

## 2012-05-12 DIAGNOSIS — C50519 Malignant neoplasm of lower-outer quadrant of unspecified female breast: Secondary | ICD-10-CM

## 2012-05-12 DIAGNOSIS — Z5189 Encounter for other specified aftercare: Secondary | ICD-10-CM

## 2012-05-12 MED ORDER — PEGFILGRASTIM INJECTION 6 MG/0.6ML
6.0000 mg | Freq: Once | SUBCUTANEOUS | Status: AC
  Administered 2012-05-12: 6 mg via SUBCUTANEOUS
  Filled 2012-05-12: qty 0.6

## 2012-05-12 NOTE — Telephone Encounter (Signed)
Patient here at 8:15 am.  Per injection nurse, patient is doing well with no complaints after this new treatment.  No side effects experienced at this time.

## 2012-05-12 NOTE — Telephone Encounter (Signed)
Message copied by Augusto Garbe on Wed May 12, 2012 11:08 AM ------      Message from: Margaretha Seeds      Created: Tue May 11, 2012  2:39 PM      Regarding: Chemo f/u call      Contact: [phone]       First time Taxotere-Dr. Rubin-Appt for Neulasta on 05/12/12 at 0815.

## 2012-05-17 ENCOUNTER — Other Ambulatory Visit: Payer: Self-pay | Admitting: Physician Assistant

## 2012-05-17 DIAGNOSIS — C50519 Malignant neoplasm of lower-outer quadrant of unspecified female breast: Secondary | ICD-10-CM

## 2012-05-18 ENCOUNTER — Ambulatory Visit (HOSPITAL_BASED_OUTPATIENT_CLINIC_OR_DEPARTMENT_OTHER): Payer: BC Managed Care – PPO | Admitting: Physician Assistant

## 2012-05-18 ENCOUNTER — Other Ambulatory Visit (HOSPITAL_BASED_OUTPATIENT_CLINIC_OR_DEPARTMENT_OTHER): Payer: BC Managed Care – PPO | Admitting: Lab

## 2012-05-18 VITALS — BP 122/75 | HR 71 | Temp 97.8°F | Ht 70.0 in | Wt 173.7 lb

## 2012-05-18 DIAGNOSIS — C50519 Malignant neoplasm of lower-outer quadrant of unspecified female breast: Secondary | ICD-10-CM

## 2012-05-18 LAB — CBC WITH DIFFERENTIAL/PLATELET
BASO%: 0.5 % (ref 0.0–2.0)
LYMPH%: 4.6 % — ABNORMAL LOW (ref 14.0–49.7)
MCHC: 32.8 g/dL (ref 31.5–36.0)
MONO#: 3.2 10*3/uL — ABNORMAL HIGH (ref 0.1–0.9)
Platelets: 192 10*3/uL (ref 145–400)
RBC: 3.36 10*6/uL — ABNORMAL LOW (ref 3.70–5.45)
RDW: 18.4 % — ABNORMAL HIGH (ref 11.2–14.5)
WBC: 40 10*3/uL — ABNORMAL HIGH (ref 3.9–10.3)
nRBC: 1 % — ABNORMAL HIGH (ref 0–0)

## 2012-05-20 NOTE — Progress Notes (Signed)
Hematology and Oncology Follow Up Visit  Betty [name] [id number] 1976-04-16 36 y.o. 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  2. History of mild afebrile neutropenia.  3. BRCA1 positive   Interim History:   Betty [name] is seen today unaccompanied for followup after her first cycle of neoadjuvant dose dense Taxotere   She denies any fevers, chills, or night sweats.  She denies any nausea, or emesis issues, though she has noticed a bit of mouth sensitivity. No diarrhea or constipation problems. Her energy level is recovering. She denies a diffuse bone pain.   A detailed review of systems is otherwise noncontributory as noted below.  Review of Systems: Constitutional:  no weight loss, fever, night sweats and feels well Eyes: no complaints ENT: no complaints Cardiovascular: no chest pain or dyspnea on exertion Respiratory: no cough, shortness of breath, or wheezing Neurological: no TIA or stroke symptoms Dermatological: negative Gastrointestinal: no abdominal pain, change in bowel habits, or black or bloody stools Genito-Urinary: no dysuria, trouble voiding, or hematuria Hematological and Lymphatic: negative Breast: negative Musculoskeletal: negative Remaining ROS negative.  Medications:   I have reviewed the patient's current medications.  Current Outpatient Prescriptions  Medication Sig Dispense Refill  . Chlorpheniramine-PSE-Ibuprofen (ADVIL ALLERGY SINUS PO) Take 1 tablet by mouth as needed. For allergies      . dexamethasone (DECADRON) 4 MG tablet Take 2 tablets by mouth once a day on the day after chemotherapy and then take 2 tablets two times a day for 2 days. Take with food.  30 tablet  1    . EPINEPHrine (EPIPEN) 0.3 mg/0.3 mL DEVI Inject 0.3 mg into the muscle once.        . lidocaine-prilocaine (EMLA) cream Apply topically as needed.  30 g  1  . LORazepam (ATIVAN) 0.5 MG tablet Take 1 tablet (0.5 mg total) by mouth every 6 (six) hours as needed (Nausea or vomiting).  30 tablet  0  . Multiple Vitamin (MULTIVITAMIN) tablet Take 1 tablet by mouth daily. chewable      . ondansetron (ZOFRAN) 8 MG tablet Take 1 tablet two times a day as needed for nausea or vomiting starting on the third day after chemotherapy.  30 tablet  1  . prochlorperazine (COMPAZINE) 10 MG tablet Take 1 tablet (10 mg total) by mouth every 6 (six) hours as needed (Nausea or vomiting).  30 tablet  1  . prochlorperazine (COMPAZINE) 25 MG suppository Place 1 suppository (25 mg total) rectally every 12 (twelve) hours as needed for nausea.  12 suppository  3    Allergies:  Allergies  Allergen Reactions  . Dextromethorphan Rash    Physical Exam: Filed Vitals:   05/18/12 1548  BP: 122/75  Pulse: 71  Temp: 97.8 F (36.6 C)    Body mass index is 24.92 kg/(m^2). Weight: 173 lbs. HEENT:  Sclerae anicteric, conjunctivae pink.  Oropharynx clear.  No mucositis or candidiasis, but some evidence of irritation at the corners of the lips. Nodes:  No cervical, supraclavicular, or axillary lymphadenopathy palpated.  Breast Exam:  Deferred.   Lungs:  Clear to auscultation bilaterally.  No crackles, rhonchi, or wheezes.   Heart:  Regular rate and rhythm.   Abdomen:  Soft,  nontender.  Positive bowel sounds.  No organomegaly or masses palpated.   Musculoskeletal:  No focal spinal tenderness to palpation.  Extremities:  Benign.  No peripheral edema or cyanosis.   Skin:  Benign.   Neuro:  Nonfocal, alert and oriented x 3.  Lab Results: Lab Results  Component Value Date   WBC 40.0* 05/18/2012   HGB 9.9* 05/18/2012   HCT 30.2* 05/18/2012   MCV 89.8 05/18/2012   PLT 192 05/18/2012   NEUTROABS 34.8* 05/18/2012     Chemistry       Component Value Date/Time   NA 138 05/11/2012 1136   K 4.1 05/11/2012 1136   CL 104 05/11/2012 1136   CO2 22 05/11/2012 1136   BUN 13 05/11/2012 1136   CREATININE 0.70 05/11/2012 1136      Component Value Date/Time   CALCIUM 9.6 05/11/2012 1136   ALKPHOS 92 05/11/2012 1136   AST 33 05/11/2012 1136   ALT 41* 05/11/2012 1136   BILITOT 0.7 05/11/2012 1136      Lab Results  Component Value Date   LABCA2 22 03/03/2012  Radiographic data: 04/28/12 BILATERAL BREAST MRI WITH AND WITHOUT CONTRAST  Technique: Multiplanar, multisequence MR images of both breasts  were obtained prior to and following the intravenous administration  of 17ml of Multihance. Three dimensional images were evaluated at  the independent DynaCad workstation.  Comparison: 02/27/2012 breast MRI.  Findings: The irregular enhancing mass located within the posterior  third of the right breast at the 8 o'clock position has decreased  in size and the intensity of enhancement has decreased. The mass  now measures 1.2 x 0.9 x 0.9 cm in size and previously measured 2.2  x 1.6 x 1.9 cm in size. There are no new worrisome enhancing foci.  There is no evidence for axillary or internal mammary adenopathy  and there are no additional findings.  IMPRESSION:  Interval decrease in size and intensity of enhancement associated  with the known right breast carcinoma located at the 8 o'clock  position. The mass now measures 1.2 x 0.9 x 0.9 cm in size  (previously 2.2 x 1.6 x 1.9 cm in size). No additional findings.  RECOMMENDATION:  Treatment plan  THREE-DIMENSIONAL MR IMAGE RENDERING ON INDEPENDENT WORKSTATION:  Three-dimensional MR images were rendered by post-processing of the  original MR data on an independent workstation. The three-  dimensional MR images were interpreted, and findings were reported  in the accompanying complete MRI report for this study.  BI-RADS CATEGORY 6: Known biopsy-proven malignancy - appropriate  action should be  taken.  Original Report Authenticated By: Rolla Plate, M.D.     2D Echocardiogram: 03/08/12 Transthoracic Echocardiography Patient: Betty [name], Sahakian MR #: [id number] Study Date: 03/08/2012 Gender: F Age: 89 Height: 152.4cm Weight: 77.3kg BSA: 1.64m^2 Pt. Status: Room:  PERFORMING [facility] Cardiology, Ec ATTENDING Ilda Foil SONOGRAPHER Jeryl Columbia cc:  ------------------------------------------------------------ LV EF: 60% - 65%  ------------------------------------------------------------ Indications: 174.9.  ------------------------------------------------------------ Study Conclusions  Left ventricle: The cavity size was normal. Systolic function was normal. The estimated ejection fraction was in the range of 60% to 65%. Wall motion was normal; there were no regional wall motion abnormalities. Transthoracic echocardiography. M-mode, complete 2D, spectral Doppler, and color Doppler. Height: Height: 152.4cm. Height: 60in. Weight: Weight: 77.3kg. Weight: 170lb. Body mass index: BMI: 33.3kg/m^2. Body surface area: BSA: 1.28m^2. Patient status: Inpatient. Location: Echo laboratory.    Assessment:  Mallorey is a 37 year old Betty [name] woman with a newly diagnosed  triple-negative right breast carcinoma for which she is completed cycle 4/4 planned neoadjuvant dose dense FEC  with Neulasta support on day 2, currently day 7 cycle 1/4 neoadjuvant dose dense Taxotere with Neulasta support on day 2.  2. History of mild afebrile neutropenia, which has resolved.  3. BRCA1 positive  Case reviewed with Dr Pierce Crane.  Plan:  Yatzil will return on 05/25/12 for a follow up exam, to include a breast exam, prior today 1 cycle 2/4 planned neoadjuvant dose dense Taxotere with Neulasta on day 2. This plan was reviewed with the patient, who voices understanding and agreement.  She knows to call with any changes or problems.   Rayleen Wyrick  T, PA-C 05/17/12

## 2012-05-21 ENCOUNTER — Telehealth: Payer: Self-pay | Admitting: *Deleted

## 2012-05-21 NOTE — Telephone Encounter (Signed)
left voice message to inform the  patient the new date and time

## 2012-05-25 ENCOUNTER — Other Ambulatory Visit (HOSPITAL_BASED_OUTPATIENT_CLINIC_OR_DEPARTMENT_OTHER): Payer: BC Managed Care – PPO | Admitting: Lab

## 2012-05-25 ENCOUNTER — Ambulatory Visit (HOSPITAL_BASED_OUTPATIENT_CLINIC_OR_DEPARTMENT_OTHER): Payer: BC Managed Care – PPO

## 2012-05-25 ENCOUNTER — Ambulatory Visit (HOSPITAL_BASED_OUTPATIENT_CLINIC_OR_DEPARTMENT_OTHER): Payer: BC Managed Care – PPO | Admitting: Physician Assistant

## 2012-05-25 ENCOUNTER — Telehealth: Payer: Self-pay | Admitting: Oncology

## 2012-05-25 VITALS — BP 115/73 | HR 78 | Temp 97.6°F | Ht 70.0 in | Wt 169.4 lb

## 2012-05-25 DIAGNOSIS — Z5111 Encounter for antineoplastic chemotherapy: Secondary | ICD-10-CM

## 2012-05-25 DIAGNOSIS — C50519 Malignant neoplasm of lower-outer quadrant of unspecified female breast: Secondary | ICD-10-CM

## 2012-05-25 LAB — COMPREHENSIVE METABOLIC PANEL
AST: 26 U/L (ref 0–37)
Alkaline Phosphatase: 124 U/L — ABNORMAL HIGH (ref 39–117)
BUN: 15 mg/dL (ref 6–23)
Creatinine, Ser: 0.74 mg/dL (ref 0.50–1.10)
Glucose, Bld: 97 mg/dL (ref 70–99)

## 2012-05-25 LAB — CBC WITH DIFFERENTIAL/PLATELET
BASO%: 0.3 % (ref 0.0–2.0)
Basophils Absolute: 0.1 10*3/uL (ref 0.0–0.1)
EOS%: 0 % (ref 0.0–7.0)
HGB: 10 g/dL — ABNORMAL LOW (ref 11.6–15.9)
MCH: 29.9 pg (ref 25.1–34.0)
MCHC: 33.6 g/dL (ref 31.5–36.0)
RDW: 18.4 % — ABNORMAL HIGH (ref 11.2–14.5)
lymph#: 1.4 10*3/uL (ref 0.9–3.3)

## 2012-05-25 MED ORDER — DOCETAXEL CHEMO INJECTION 160 MG/16ML
75.0000 mg/m2 | Freq: Once | INTRAVENOUS | Status: AC
  Administered 2012-05-25: 150 mg via INTRAVENOUS
  Filled 2012-05-25: qty 15

## 2012-05-25 MED ORDER — SODIUM CHLORIDE 0.9 % IV SOLN: Freq: Once | INTRAVENOUS | Status: DC

## 2012-05-25 MED ORDER — DEXAMETHASONE SODIUM PHOSPHATE 10 MG/ML IJ SOLN
10.0000 mg | Freq: Once | INTRAMUSCULAR | Status: AC
  Administered 2012-05-25: 10 mg via INTRAVENOUS

## 2012-05-25 MED ORDER — SODIUM CHLORIDE 0.9 % IJ SOLN
10.0000 mL | INTRAMUSCULAR | Status: DC | PRN
  Administered 2012-05-25: 10 mL
  Filled 2012-05-25: qty 10

## 2012-05-25 MED ORDER — HEPARIN SOD (PORK) LOCK FLUSH 100 UNIT/ML IV SOLN
500.0000 [IU] | Freq: Once | INTRAVENOUS | Status: AC | PRN
  Administered 2012-05-25: 500 [IU]
  Filled 2012-05-25: qty 5

## 2012-05-25 MED ORDER — ONDANSETRON 8 MG/50ML IVPB (CHCC)
8.0000 mg | Freq: Once | INTRAVENOUS | Status: AC
  Administered 2012-05-25: 8 mg via INTRAVENOUS

## 2012-05-25 NOTE — Patient Instructions (Signed)
[facility] Discharge Instructions for Patients Receiving Chemotherapy  Today you received the following chemotherapy agents taxotere  To help prevent nausea and vomiting after your treatment, we encourage you to take your nausea medication  urs.   If you develop nausea and vomiting that is not controlled by your nausea medication, call the clinic. If it is after clinic hours your family physician or the after hours number for the clinic or go to the Emergency Department.   BELOW ARE SYMPTOMS THAT SHOULD BE REPORTED IMMEDIATELY:  *FEVER GREATER THAN 100.5 F  *CHILLS WITH OR WITHOUT FEVER  NAUSEA AND VOMITING THAT IS NOT CONTROLLED WITH YOUR NAUSEA MEDICATION  *UNUSUAL SHORTNESS OF BREATH  *UNUSUAL BRUISING OR BLEEDING  TENDERNESS IN MOUTH AND THROAT WITH OR WITHOUT PRESENCE OF ULCERS  *URINARY PROBLEMS  *BOWEL PROBLEMS  UNUSUAL RASH Items with * indicate a potential emergency and should be followed up as soon as possible.   Please let the nurse know about any problems that you may have experienced. Feel free to call the clinic you have any questions or concerns. The clinic phone number is [phone].   I have been informed and understand all the instructions given to me. I know to contact the clinic, my physician, or go to the Emergency Department if any problems should occur. I do not have any questions at this time, but understand that I may call the clinic during office hours   should I have any questions or need assistance in obtaining follow up care.    __________________________________________  _____________  __________ Signature of Patient or Authorized Representative            Date                   Time    __________________________________________ Nurse's Signature

## 2012-05-25 NOTE — Progress Notes (Signed)
Hematology and Oncology Follow Up Visit  LAJUANNA POMPA [id number] 05/14/1976 36 y.o. 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  2. History of mild afebrile neutropenia.  3. BRCA1 positive   Interim History:   Jonasia is seen todaywith her father in attendence for followup prior to day 1 cycle 2/4 neoadjuvant dose dense Taxotere.  She has premedicated with dexamethasone as per Taxotere protocol.   She denies any fevers, chills, or night sweats.  She denies any nausea, or emesis issues. No diarrhea or constipation problems. Her energy level is recovering. She denies a diffuse bone pain.   A detailed review of systems is otherwise noncontributory as noted below.  Review of Systems: Constitutional:  no weight loss, fever, night sweats and feels well Eyes: no complaints ENT: no complaints Cardiovascular: no chest pain or dyspnea on exertion Respiratory: no cough, shortness of breath, or wheezing Neurological: no TIA or stroke symptoms Dermatological: negative Gastrointestinal: no abdominal pain, change in bowel habits, or black or bloody stools Genito-Urinary: no dysuria, trouble voiding, or hematuria Hematological and Lymphatic: negative Breast: negative Musculoskeletal: negative Remaining ROS negative.  Medications:   I have reviewed the patient's current medications.  Current Outpatient Prescriptions  Medication Sig Dispense Refill  . Chlorpheniramine-PSE-Ibuprofen (ADVIL ALLERGY SINUS PO) Take 1 tablet by mouth as needed. For allergies      . dexamethasone (DECADRON) 4 MG tablet Take 2 tablets by mouth once a day on the day after chemotherapy and then take 2 tablets two times a day for 2  days. Take with food.  30 tablet  1  . EPINEPHrine (EPIPEN) 0.3 mg/0.3 mL DEVI Inject 0.3 mg into the muscle once.        . lidocaine-prilocaine (EMLA) cream Apply topically as needed.  30 g  1  . LORazepam (ATIVAN) 0.5 MG tablet Take 1 tablet (0.5 mg total) by mouth every 6 (six) hours as needed (Nausea or vomiting).  30 tablet  0  . Multiple Vitamin (MULTIVITAMIN) tablet Take 1 tablet by mouth daily. chewable      . ondansetron (ZOFRAN) 8 MG tablet Take 1 tablet two times a day as needed for nausea or vomiting starting on the third day after chemotherapy.  30 tablet  1  . prochlorperazine (COMPAZINE) 10 MG tablet Take 1 tablet (10 mg total) by mouth every 6 (six) hours as needed (Nausea or vomiting).  30 tablet  1  . prochlorperazine (COMPAZINE) 25 MG suppository Place 1 suppository (25 mg total) rectally every 12 (twelve) hours as needed for nausea.  12 suppository  3    Allergies:  Allergies  Allergen Reactions  . Dextromethorphan Rash    Physical Exam: Filed Vitals:   05/25/12 1024  BP: 115/73  Pulse: 78  Temp: 97.6 F (36.4 C)    Body mass index is 24.31 kg/(m^2). Weight: 169 lbs. HEENT:  Sclerae anicteric, conjunctivae pink.  Oropharynx clear.  No mucositis or candidiasis.   Nodes:  No cervical, supraclavicular, or axillary lymphadenopathy palpated.  Breast Exam:  Right breast examined, no evidence of palpable mass effect, axilla is clear.   Lungs:  Clear to auscultation bilaterally.  No crackles, rhonchi, or wheezes.   Heart:  Regular rate  and rhythm.   Abdomen:  Soft, nontender.  Positive bowel sounds.  No organomegaly or masses palpated.   Musculoskeletal:  No focal spinal tenderness to palpation.  Extremities:  Benign.  No peripheral edema or cyanosis.   Skin:  Benign.   Neuro:  Nonfocal, alert and oriented x 3.  Lab Results: Lab Results  Component Value Date   WBC 41.9* 05/25/2012   HGB 10.0* 05/25/2012   HCT 29.8* 05/25/2012   MCV 89.2 05/25/2012   PLT 178  05/25/2012   NEUTROABS 39.1* 05/25/2012     Chemistry      Component Value Date/Time   NA 138 05/11/2012 1136   K 4.1 05/11/2012 1136   CL 104 05/11/2012 1136   CO2 22 05/11/2012 1136   BUN 13 05/11/2012 1136   CREATININE 0.70 05/11/2012 1136      Component Value Date/Time   CALCIUM 9.6 05/11/2012 1136   ALKPHOS 92 05/11/2012 1136   AST 33 05/11/2012 1136   ALT 41* 05/11/2012 1136   BILITOT 0.7 05/11/2012 1136      Lab Results  Component Value Date   LABCA2 22 03/03/2012  Radiographic data: 04/28/12 BILATERAL BREAST MRI WITH AND WITHOUT CONTRAST  Technique: Multiplanar, multisequence MR images of both breasts  were obtained prior to and following the intravenous administration  of 17ml of Multihance. Three dimensional images were evaluated at  the independent DynaCad workstation.  Comparison: 02/27/2012 breast MRI.  Findings: The irregular enhancing mass located within the posterior  third of the right breast at the 8 o'clock position has decreased  in size and the intensity of enhancement has decreased. The mass  now measures 1.2 x 0.9 x 0.9 cm in size and previously measured 2.2  x 1.6 x 1.9 cm in size. There are no new worrisome enhancing foci.  There is no evidence for axillary or internal mammary adenopathy  and there are no additional findings.  IMPRESSION:  Interval decrease in size and intensity of enhancement associated  with the known right breast carcinoma located at the 8 o'clock  position. The mass now measures 1.2 x 0.9 x 0.9 cm in size  (previously 2.2 x 1.6 x 1.9 cm in size). No additional findings.  RECOMMENDATION:  Treatment plan  THREE-DIMENSIONAL MR IMAGE RENDERING ON INDEPENDENT WORKSTATION:  Three-dimensional MR images were rendered by post-processing of the  original MR data on an independent workstation. The three-  dimensional MR images were interpreted, and findings were reported  in the accompanying complete MRI report for this study.  BI-RADS CATEGORY 6: Known  biopsy-proven malignancy - appropriate  action should be taken.  Original Report Authenticated By: Rolla Plate, M.D.     2D Echocardiogram: 03/08/12 Transthoracic Echocardiography Patient: Virginie, Josten MR #: [id number] Study Date: 03/08/2012 Gender: F Age: 70 Height: 152.4cm Weight: 77.3kg BSA: 1.41m^2 Pt. Status: Room:  PERFORMING [facility] Cardiology, Ec ATTENDING Ilda Foil SONOGRAPHER Jeryl Columbia cc:  ------------------------------------------------------------ LV EF: 60% - 65%  ------------------------------------------------------------ Indications: 174.9.  ------------------------------------------------------------ Study Conclusions  Left ventricle: The cavity size was normal. Systolic function was normal. The estimated ejection fraction was in the range of 60% to 65%. Wall motion was normal; there were no regional wall motion abnormalities. Transthoracic echocardiography. M-mode, complete 2D, spectral Doppler, and color Doppler. Height: Height: 152.4cm. Height: 60in. Weight: Weight: 77.3kg. Weight: 170lb. Body mass index: BMI: 33.3kg/m^2. Body surface area: BSA: 1.57m^2. Patient status: Inpatient. Location: Echo laboratory.    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  2. History of mild afebrile neutropenia, which has resolved.  3. BRCA1 positive  Case reviewed with Dr Pierce Crane.  Plan:  Amena will receive day 1 cycle 2/4 planned neoadjuvant dose dense Taxotere with Neulasta on day 2 today as scheduled. I will see her in 1 weeks time for assessment. Refill of lorazepam 0.5mg  provided. This plan was reviewed with the patient, who voices  understanding and agreement.  She knows to call with any changes or problems.   Shermaine Rivet T, PA-C 05/25/12

## 2012-05-25 NOTE — Telephone Encounter (Signed)
gve the pt her July,aug 2013 appt calendar along with the appt to see dr blackmon at [facility] in [place]

## 2012-05-26 ENCOUNTER — Ambulatory Visit: Payer: BC Managed Care – PPO | Admitting: Physician Assistant

## 2012-05-26 ENCOUNTER — Ambulatory Visit (HOSPITAL_BASED_OUTPATIENT_CLINIC_OR_DEPARTMENT_OTHER): Payer: BC Managed Care – PPO

## 2012-05-26 VITALS — BP 107/69 | HR 64 | Temp 97.8°F

## 2012-05-26 DIAGNOSIS — C50519 Malignant neoplasm of lower-outer quadrant of unspecified female breast: Secondary | ICD-10-CM

## 2012-05-26 DIAGNOSIS — Z5189 Encounter for other specified aftercare: Secondary | ICD-10-CM

## 2012-05-26 MED ORDER — PEGFILGRASTIM INJECTION 6 MG/0.6ML
6.0000 mg | Freq: Once | SUBCUTANEOUS | Status: AC
  Administered 2012-05-26: 6 mg via SUBCUTANEOUS
  Filled 2012-05-26: qty 0.6

## 2012-05-31 ENCOUNTER — Other Ambulatory Visit: Payer: Self-pay | Admitting: Family

## 2012-05-31 DIAGNOSIS — C50519 Malignant neoplasm of lower-outer quadrant of unspecified female breast: Secondary | ICD-10-CM

## 2012-06-01 ENCOUNTER — Other Ambulatory Visit (HOSPITAL_BASED_OUTPATIENT_CLINIC_OR_DEPARTMENT_OTHER): Payer: BC Managed Care – PPO | Admitting: Lab

## 2012-06-01 ENCOUNTER — Ambulatory Visit (HOSPITAL_BASED_OUTPATIENT_CLINIC_OR_DEPARTMENT_OTHER): Payer: BC Managed Care – PPO | Admitting: Family

## 2012-06-01 ENCOUNTER — Encounter: Payer: Self-pay | Admitting: Family

## 2012-06-01 VITALS — BP 119/76 | HR 93 | Temp 98.0°F | Ht 70.0 in | Wt 172.4 lb

## 2012-06-01 DIAGNOSIS — C50519 Malignant neoplasm of lower-outer quadrant of unspecified female breast: Secondary | ICD-10-CM

## 2012-06-01 DIAGNOSIS — Z1501 Genetic susceptibility to malignant neoplasm of breast: Secondary | ICD-10-CM

## 2012-06-01 LAB — COMPREHENSIVE METABOLIC PANEL
ALT: 48 U/L — ABNORMAL HIGH (ref 0–35)
Alkaline Phosphatase: 150 U/L — ABNORMAL HIGH (ref 39–117)
Sodium: 140 mEq/L (ref 135–145)
Total Bilirubin: 0.5 mg/dL (ref 0.3–1.2)
Total Protein: 6.5 g/dL (ref 6.0–8.3)

## 2012-06-01 LAB — CBC WITH DIFFERENTIAL/PLATELET
BASO%: 0.2 % (ref 0.0–2.0)
LYMPH%: 5.5 % — ABNORMAL LOW (ref 14.0–49.7)
MCHC: 33.5 g/dL (ref 31.5–36.0)
MCV: 90.1 fL (ref 79.5–101.0)
MONO#: 1 10*3/uL — ABNORMAL HIGH (ref 0.1–0.9)
MONO%: 6.6 % (ref 0.0–14.0)
Platelets: 152 10*3/uL (ref 145–400)
RBC: 3.25 10*6/uL — ABNORMAL LOW (ref 3.70–5.45)
WBC: 14.9 10*3/uL — ABNORMAL HIGH (ref 3.9–10.3)

## 2012-06-01 NOTE — Patient Instructions (Signed)
Return 9/6 for next cycle.

## 2012-06-01 NOTE — Progress Notes (Signed)
Hematology and Oncology Follow Up Visit  Betty [name] [id number] November 07, 1975 36 y.o. 06/01/2012    HPI: 36 year old Betty [name] [place] woman with: 1. 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 2. History of mild afebrile neutropenia. 3. BRCA1 positive  CURRENT THERAPY: Completed neoadjuvant dose dense FEC  with Neulasta support on day 2, has completed cycle 2/4 planned neoadjuvant dose dense Taxotere with Neulasta support on day 2.  Interim History:   Alone at today's visit, for lab and office visit only. She has completed cycle 2 of DD Taxotere, is excited to be halfway finished with Taxotere. Noticed increased fatigue with cycle 2, continues to work full-time. No bone pain with last Neulasta injection. Has mild numbness and tingling, bilateral hands. No noticeable nail changes on hands or feet. Has had mild headache associated with seasonal allergies, no blurred vision. No cough or shortness of breath. No abdominal pain or new bone pain. Bowel and bladder function are normal. Appetite is good, with adequate fluid intake. Remainder of the 10 point  review of systems is negative.  Medications:   I have reviewed the patient's current medications.  Current Outpatient Prescriptions  Medication Sig Dispense Refill  . Chlorpheniramine-PSE-Ibuprofen (ADVIL ALLERGY SINUS PO) Take 1 tablet by mouth as needed. For allergies      . dexamethasone (DECADRON) 4 MG tablet Take 2 tablets by mouth once a day on the day after chemotherapy and then take 2 tablets two times a day for 2 days. Take with food.  30 tablet  1  . EPINEPHrine (EPIPEN) 0.3 mg/0.3 mL DEVI Inject 0.3 mg into the muscle once.        . lidocaine-prilocaine (EMLA) cream Apply topically as needed.  30 g  1  . LORazepam (ATIVAN) 0.5 MG tablet Take 1 tablet (0.5 mg total) by mouth every 6 (six) hours as needed (Nausea or vomiting).  30 tablet  0  . Multiple Vitamin (MULTIVITAMIN) tablet Take 1 tablet by mouth daily. chewable      .  ondansetron (ZOFRAN) 8 MG tablet Take 1 tablet two times a day as needed for nausea or vomiting starting on the third day after chemotherapy.  30 tablet  1  . prochlorperazine (COMPAZINE) 10 MG tablet Take 1 tablet (10 mg total) by mouth every 6 (six) hours as needed (Nausea or vomiting).  30 tablet  1  . prochlorperazine (COMPAZINE) 25 MG suppository Place 1 suppository (25 mg total) rectally every 12 (twelve) hours as needed for nausea.  12 suppository  3    Allergies:  Allergies  Allergen Reactions  . Dextromethorphan Rash    Physical Exam: Filed Vitals:   06/01/12 1008  BP: 119/76  Pulse: 93  Temp: 98 F (36.7 C)    Body mass index is 24.74 kg/(m^2). Weight: 169 lbs. Lab Results: General: Well developed, well nourished, in no acute distress.  EENT: No ocular or oral lesions. No stomatitis.  Respiratory: Lungs are clear to auscultation bilaterally with normal respiratory movement and no accessory muscle use. Cardiac: No murmur, rub or tachycardia. No upper or lower extremity edema.  GI: Abdomen is soft, no palpable hepatosplenomegaly. No fluid wave. No tenderness. Musculoskeletal: No kyphosis, no tenderness over the spine, ribs or hips. Lymph: No cervical, infraclavicular, axillary or inguinal adenopathy. Neuro: No focal neurological deficits. Psych: Alert and oriented X 3, appropriate mood and affect.    Lab Results  Component Value Date   WBC 14.9* 06/01/2012   HGB 9.8* 06/01/2012   HCT 29.3* 06/01/2012  MCV 90.1 06/01/2012   PLT 152 06/01/2012   NEUTROABS 13.0* 06/01/2012     Chemistry      Component Value Date/Time   NA 139 05/25/2012 1015   K 4.0 05/25/2012 1015   CL 104 05/25/2012 1015   CO2 23 05/25/2012 1015   BUN 15 05/25/2012 1015   CREATININE 0.74 05/25/2012 1015      Component Value Date/Time   CALCIUM 9.7 05/25/2012 1015   ALKPHOS 124* 05/25/2012 1015   AST 26 05/25/2012 1015   ALT 48* 05/25/2012 1015   BILITOT 0.7 05/25/2012 1015      Assessment:  1.  Newly diagnosed triple-negative right breast carcinoma, receiving treatment as outlined above with excellent tolerance.  2. History of mild afebrile neutropenia, which has resolved. 3. BRCA1 positive  Plan: 1. Return 8/6 for lab and appt with me prior to cycle 3/4 Taxotere.  2. Appt with Dr. Rayburn Ma 06/25/12 to discuss surgery.    Martrice Apt, FNP-C

## 2012-06-04 ENCOUNTER — Other Ambulatory Visit: Payer: BC Managed Care – PPO | Admitting: Lab

## 2012-06-04 ENCOUNTER — Ambulatory Visit: Payer: BC Managed Care – PPO | Admitting: Oncology

## 2012-06-08 ENCOUNTER — Ambulatory Visit (HOSPITAL_BASED_OUTPATIENT_CLINIC_OR_DEPARTMENT_OTHER): Payer: BC Managed Care – PPO | Admitting: Family

## 2012-06-08 ENCOUNTER — Other Ambulatory Visit: Payer: Self-pay | Admitting: Family

## 2012-06-08 ENCOUNTER — Ambulatory Visit (HOSPITAL_BASED_OUTPATIENT_CLINIC_OR_DEPARTMENT_OTHER): Payer: BC Managed Care – PPO

## 2012-06-08 ENCOUNTER — Encounter: Payer: Self-pay | Admitting: Family

## 2012-06-08 ENCOUNTER — Other Ambulatory Visit: Payer: BC Managed Care – PPO | Admitting: Lab

## 2012-06-08 ENCOUNTER — Other Ambulatory Visit (HOSPITAL_BASED_OUTPATIENT_CLINIC_OR_DEPARTMENT_OTHER): Payer: BC Managed Care – PPO | Admitting: Lab

## 2012-06-08 VITALS — BP 123/79 | HR 80 | Temp 97.8°F | Resp 20 | Ht 70.0 in | Wt 173.8 lb

## 2012-06-08 DIAGNOSIS — C50519 Malignant neoplasm of lower-outer quadrant of unspecified female breast: Secondary | ICD-10-CM

## 2012-06-08 DIAGNOSIS — Z5111 Encounter for antineoplastic chemotherapy: Secondary | ICD-10-CM

## 2012-06-08 DIAGNOSIS — Z1501 Genetic susceptibility to malignant neoplasm of breast: Secondary | ICD-10-CM

## 2012-06-08 DIAGNOSIS — C50919 Malignant neoplasm of unspecified site of unspecified female breast: Secondary | ICD-10-CM

## 2012-06-08 LAB — CBC WITH DIFFERENTIAL/PLATELET
BASO%: 0.2 % (ref 0.0–2.0)
EOS%: 0.3 % (ref 0.0–7.0)
Eosinophils Absolute: 0.1 10*3/uL (ref 0.0–0.5)
LYMPH%: 6.5 % — ABNORMAL LOW (ref 14.0–49.7)
MCH: 29.1 pg (ref 25.1–34.0)
MCHC: 32.2 g/dL (ref 31.5–36.0)
MCV: 90.3 fL (ref 79.5–101.0)
MONO%: 1.4 % (ref 0.0–14.0)
NEUT#: 14.7 10*3/uL — ABNORMAL HIGH (ref 1.5–6.5)
Platelets: 205 10*3/uL (ref 145–400)
RBC: 3.3 10*6/uL — ABNORMAL LOW (ref 3.70–5.45)
RDW: 17.4 % — ABNORMAL HIGH (ref 11.2–14.5)
nRBC: 0 % (ref 0–0)

## 2012-06-08 LAB — COMPREHENSIVE METABOLIC PANEL
ALT: 28 U/L (ref 0–35)
Alkaline Phosphatase: 107 U/L (ref 39–117)
CO2: 23 mEq/L (ref 19–32)
Creatinine, Ser: 0.68 mg/dL (ref 0.50–1.10)
Sodium: 139 mEq/L (ref 135–145)
Total Bilirubin: 0.5 mg/dL (ref 0.3–1.2)
Total Protein: 6.7 g/dL (ref 6.0–8.3)

## 2012-06-08 LAB — LACTATE DEHYDROGENASE: LDH: 235 U/L (ref 94–250)

## 2012-06-08 MED ORDER — ONDANSETRON 8 MG/50ML IVPB (CHCC)
8.0000 mg | Freq: Once | INTRAVENOUS | Status: AC
  Administered 2012-06-08: 8 mg via INTRAVENOUS

## 2012-06-08 MED ORDER — DEXAMETHASONE SODIUM PHOSPHATE 10 MG/ML IJ SOLN
10.0000 mg | Freq: Once | INTRAMUSCULAR | Status: AC
  Administered 2012-06-08: 10 mg via INTRAVENOUS

## 2012-06-08 MED ORDER — DOCETAXEL CHEMO INJECTION 160 MG/16ML
75.0000 mg/m2 | Freq: Once | INTRAVENOUS | Status: AC
  Administered 2012-06-08: 150 mg via INTRAVENOUS
  Filled 2012-06-08: qty 15

## 2012-06-08 MED ORDER — SODIUM CHLORIDE 0.9 % IV SOLN
Freq: Once | INTRAVENOUS | Status: AC
  Administered 2012-06-08: 10:00:00 via INTRAVENOUS

## 2012-06-08 NOTE — Patient Instructions (Signed)
Return 8/13 for lab and appt with Dr. Donnie Coffin. She will also see Dr. Donnie Coffin 8/20 prior to cycle 4/4 Taxotere.  2. Appt with Dr. Rayburn Ma 06/25/12 to discuss surgery.

## 2012-06-08 NOTE — Progress Notes (Signed)
Hematology and Oncology Follow Up Visit  Betty [name] [id number] [birth year] 36 y.o. 06/08/2012    HPI: 36 year old [name] with: 1. 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 2. History of mild afebrile neutropenia. 3. BRCA1 positive  CURRENT THERAPY: Completed neoadjuvant dose dense FEC  with Neulasta support on day 2, has completed cycle 2/4 planned neoadjuvant dose dense Taxotere with Neulasta support on day 2.  Interim History:   Accompanied by her sister who is 98 y/o and also BRCA1 positive. Betty [name] has completed cycle 3 of DD Taxotere, is excited have only 1 cycle after today's treatment. Noticed increased fatigue with cycle 2 & 3, continues to work full-time. No bone pain with last Neulasta injection. Has mild numbness and tingling, bilateral hands. No noticeable nail changes on hands or feet. Has had mild headache associated with seasonal allergies, no blurred vision. No cough or shortness of breath. No abdominal pain or new bone pain. Bowel and bladder function are normal. Appetite is good, with adequate fluid intake. Remainder of the 10 point  review of systems is negative.  Medications:   I have reviewed the patient's current medications.  Current Outpatient Prescriptions  Medication Sig Dispense Refill  . Chlorpheniramine-PSE-Ibuprofen (ADVIL ALLERGY SINUS PO) Take 1 tablet by mouth as needed. For allergies      . dexamethasone (DECADRON) 4 MG tablet Take 2 tablets by mouth once a day on the day after chemotherapy and then take 2 tablets two times a day for 2 days. Take with food.  30 tablet  1  . EPINEPHrine (EPIPEN) 0.3 mg/0.3 mL DEVI Inject 0.3 mg into the muscle once.        . lidocaine-prilocaine (EMLA) cream Apply topically as needed.  30 g  1  . LORazepam (ATIVAN) 0.5 MG tablet Take 1 tablet (0.5 mg total) by mouth every 6 (six) hours as needed (Nausea or vomiting).  30 tablet  0  . Multiple Vitamin (MULTIVITAMIN) tablet Take 1 tablet by mouth daily.  chewable      . ondansetron (ZOFRAN) 8 MG tablet Take 1 tablet two times a day as needed for nausea or vomiting starting on the third day after chemotherapy.  30 tablet  1  . prochlorperazine (COMPAZINE) 10 MG tablet Take 1 tablet (10 mg total) by mouth every 6 (six) hours as needed (Nausea or vomiting).  30 tablet  1  . prochlorperazine (COMPAZINE) 25 MG suppository Place 1 suppository (25 mg total) rectally every 12 (twelve) hours as needed for nausea.  12 suppository  3   No current facility-administered medications for this visit.   Facility-Administered Medications Ordered in Other Visits  Medication Dose Route Frequency Provider Last Rate Last Dose  . 0.9 %  sodium chloride infusion   Intravenous Once Pierce Crane, MD 20 mL/hr at 06/08/12 0935    . dexamethasone (DECADRON) injection 10 mg  10 mg Intravenous Once Pierce Crane, MD   10 mg at 06/08/12 0935  . DOCEtaxel (TAXOTERE) 150 mg in dextrose 5 % 250 mL chemo infusion  75 mg/m2 (Treatment Plan Actual) Intravenous Once Pierce Crane, MD 265 mL/hr at 06/08/12 1010 150 mg at 06/08/12 1010  . ondansetron (ZOFRAN) IVPB 8 mg  8 mg Intravenous Once Pierce Crane, MD   8 mg at 06/08/12 0935    Allergies:  Allergies  Allergen Reactions  . Dextromethorphan Rash    Physical Exam: Filed Vitals:   06/08/12 0855  BP: 123/79  Pulse: 80  Temp: 97.8 F (36.6 C)  Resp: 20    Body mass index is 24.94 kg/(m^2). Weight: 169 lbs. Lab Results: General: Well developed, well nourished, in no acute distress. Accompanied by her sister.  EENT: No ocular or oral lesions. No stomatitis.  Respiratory: Lungs are clear to auscultation bilaterally with normal respiratory movement and no accessory muscle use. Cardiac: No murmur, rub or tachycardia. No upper or lower extremity edema.  GI: Abdomen is soft, no palpable hepatosplenomegaly. No fluid wave. No tenderness. Musculoskeletal: No kyphosis, no tenderness over the spine, ribs or hips. Lymph: No cervical,  infraclavicular, axillary or inguinal adenopathy. Neuro: No focal neurological deficits. Psych: Alert and oriented X 3, appropriate mood and affect.    Lab Results  Component Value Date   WBC 16.1* 06/08/2012   HGB 9.6* 06/08/2012   HCT 29.8* 06/08/2012   MCV 90.3 06/08/2012   PLT 205 06/08/2012   NEUTROABS 14.7* 06/08/2012    Assessment:  1. Newly diagnosed triple-negative right breast carcinoma, receiving treatment as outlined above with excellent tolerance.  2. History of mild afebrile neutropenia, which has resolved. 3. BRCA1 positive 4. Hgb trending down, adequate to treat.   Plan: 1. Return 8/13 for lab and appt with Dr. Donnie Coffin. She will also see Dr. Donnie Coffin 8/20 prior to cycle 4/4 Taxotere.  2. Appt with Dr. Rayburn Ma 06/25/12 to discuss surgery.    Betty Mcclatchy, FNP-C

## 2012-06-08 NOTE — Patient Instructions (Addendum)
[facility] Discharge Instructions for Patients Receiving Chemotherapy  Today you received the following chemotherapy agents Taxotere.  To help prevent nausea and vomiting after your treatment, we encourage you to take your nausea medication.  If you develop nausea and vomiting that is not controlled by your nausea medication, call the clinic. If it is after clinic hours your family physician or the after hours number for the clinic or go to the Emergency Department.   BELOW ARE SYMPTOMS THAT SHOULD BE REPORTED IMMEDIATELY:  *FEVER GREATER THAN 100.5 F  *CHILLS WITH OR WITHOUT FEVER  NAUSEA AND VOMITING THAT IS NOT CONTROLLED WITH YOUR NAUSEA MEDICATION  *UNUSUAL SHORTNESS OF BREATH  *UNUSUAL BRUISING OR BLEEDING  TENDERNESS IN MOUTH AND THROAT WITH OR WITHOUT PRESENCE OF ULCERS  *URINARY PROBLEMS  *BOWEL PROBLEMS  UNUSUAL RASH Items with * indicate a potential emergency and should be followed up as soon as possible.  One of the nurses will contact you 24 hours after your treatment. Please let the nurse know about any problems that you may have experienced. Feel free to call the clinic you have any questions or concerns. The clinic phone number is [phone].   I have been informed and understand all the instructions given to me. I know to contact the clinic, my physician, or go to the Emergency Department if any problems should occur. I do not have any questions at this time, but understand that I may call the clinic during office hours   should I have any questions or need assistance in obtaining follow up care.    __________________________________________  _____________  __________ Signature of Patient or Authorized Representative            Date                   Time    __________________________________________ Nurse's Signature    

## 2012-06-09 ENCOUNTER — Ambulatory Visit (HOSPITAL_BASED_OUTPATIENT_CLINIC_OR_DEPARTMENT_OTHER): Payer: BC Managed Care – PPO

## 2012-06-09 VITALS — BP 125/74 | HR 69 | Temp 98.3°F

## 2012-06-09 DIAGNOSIS — C50519 Malignant neoplasm of lower-outer quadrant of unspecified female breast: Secondary | ICD-10-CM

## 2012-06-09 DIAGNOSIS — Z5189 Encounter for other specified aftercare: Secondary | ICD-10-CM

## 2012-06-09 MED ORDER — PEGFILGRASTIM INJECTION 6 MG/0.6ML
6.0000 mg | Freq: Once | SUBCUTANEOUS | Status: AC
  Administered 2012-06-09: 6 mg via SUBCUTANEOUS

## 2012-06-15 ENCOUNTER — Other Ambulatory Visit (HOSPITAL_BASED_OUTPATIENT_CLINIC_OR_DEPARTMENT_OTHER): Payer: BC Managed Care – PPO | Admitting: Lab

## 2012-06-15 ENCOUNTER — Ambulatory Visit (HOSPITAL_BASED_OUTPATIENT_CLINIC_OR_DEPARTMENT_OTHER): Payer: BC Managed Care – PPO | Admitting: Oncology

## 2012-06-15 ENCOUNTER — Other Ambulatory Visit: Payer: Self-pay | Admitting: *Deleted

## 2012-06-15 VITALS — BP 123/83 | HR 90 | Temp 99.0°F | Resp 20 | Ht 70.0 in | Wt 172.4 lb

## 2012-06-15 DIAGNOSIS — C50519 Malignant neoplasm of lower-outer quadrant of unspecified female breast: Secondary | ICD-10-CM

## 2012-06-15 DIAGNOSIS — Z1501 Genetic susceptibility to malignant neoplasm of breast: Secondary | ICD-10-CM

## 2012-06-15 DIAGNOSIS — D649 Anemia, unspecified: Secondary | ICD-10-CM

## 2012-06-15 LAB — CBC WITH DIFFERENTIAL/PLATELET
BASO%: 0.8 % (ref 0.0–2.0)
EOS%: 0.5 % (ref 0.0–7.0)
HCT: 28.7 % — ABNORMAL LOW (ref 34.8–46.6)
LYMPH%: 6.7 % — ABNORMAL LOW (ref 14.0–49.7)
MCH: 28.7 pg (ref 25.1–34.0)
MCHC: 31.7 g/dL (ref 31.5–36.0)
MCV: 90.5 fL (ref 79.5–101.0)
MONO%: 6.8 % (ref 0.0–14.0)
NEUT%: 85.2 % — ABNORMAL HIGH (ref 38.4–76.8)
Platelets: 206 10*3/uL (ref 145–400)
RBC: 3.17 10*6/uL — ABNORMAL LOW (ref 3.70–5.45)

## 2012-06-15 MED ORDER — DEXAMETHASONE 4 MG PO TABS: ORAL_TABLET | ORAL | Status: DC

## 2012-06-15 NOTE — Progress Notes (Signed)
Hematology and Oncology Follow Up Visit  Betty [name] [id number] 01/05/1976 36 y.o. 06/15/2012    HPI: 36 year old [name] with: 1. 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 2. History of mild afebrile neutropenia. 3. BRCA1 positive  CURRENT THERAPY: Completed neoadjuvant dose dense FEC  with Neulasta support on day 2, is here today for D7, C3 planned neoadjuvant dose dense Taxotere with Neulasta support on day 2.  Interim History:   She is doing well, without complaints, she works full time. She is tolerating taxotere, with minimal finger pain. . No cough or shortness of breath. No abdominal pain or new bone pain. Bowel and bladder function are normal. Appetite is good, with adequate fluid intake. Remainder of the 10 point  review of systems is negative.  Medications:   I have reviewed the patient's current medications.  Current Outpatient Prescriptions  Medication Sig Dispense Refill  . Chlorpheniramine-PSE-Ibuprofen (ADVIL ALLERGY SINUS PO) Take 1 tablet by mouth as needed. For allergies      . dexamethasone (DECADRON) 4 MG tablet Take 2 tablets by mouth once a day on the day after chemotherapy and then take 2 tablets two times a day for 2 days. Take with food.  30 tablet  1  . EPINEPHrine (EPIPEN) 0.3 mg/0.3 mL DEVI Inject 0.3 mg into the muscle once.        . lidocaine-prilocaine (EMLA) cream Apply topically as needed.  30 g  1  . LORazepam (ATIVAN) 0.5 MG tablet Take 1 tablet (0.5 mg total) by mouth every 6 (six) hours as needed (Nausea or vomiting).  30 tablet  0  . Multiple Vitamin (MULTIVITAMIN) tablet Take 1 tablet by mouth daily. chewable      . ondansetron (ZOFRAN) 8 MG tablet Take 1 tablet two times a day as needed for nausea or vomiting starting on the third day after chemotherapy.  30 tablet  1  . prochlorperazine (COMPAZINE) 10 MG tablet Take 1 tablet (10 mg total) by mouth every 6 (six) hours as needed (Nausea or vomiting).  30 tablet  1  .  prochlorperazine (COMPAZINE) 25 MG suppository Place 1 suppository (25 mg total) rectally every 12 (twelve) hours as needed for nausea.  12 suppository  3    Allergies:  Allergies  Allergen Reactions  . Dextromethorphan Rash    Physical Exam: Filed Vitals:   06/15/12 0900  BP: 123/83  Pulse: 90  Temp: 99 F (37.2 C)  Resp: 20    Body mass index is 24.74 kg/(m^2). Weight: 169 lbs. Lab Results: General: Well developed, well nourished, in no acute distress. Accompanied by her sister.  EENT: No ocular or oral lesions. No stomatitis.  Respiratory: Lungs are clear to auscultation bilaterally with normal respiratory movement and no accessory muscle use. Cardiac: No murmur, rub or tachycardia. No upper or lower extremity edema.  GI: Abdomen is soft, no palpable hepatosplenomegaly. No fluid wave. No tenderness. Musculoskeletal: No kyphosis, no tenderness over the spine, ribs or hips. Lymph: No cervical, infraclavicular, axillary or inguinal adenopathy. Neuro: No focal neurological deficits. Psych: Alert and oriented X 3, appropriate mood and affect.    Lab Results  Component Value Date   WBC 17.1* 06/15/2012   HGB 9.1* 06/15/2012   HCT 28.7* 06/15/2012   MCV 90.5 06/15/2012   PLT 206 06/15/2012   NEUTROABS 14.6* 06/15/2012    Assessment:  1. Newly diagnosed triple-negative right breast carcinoma, receiving treatment as outlined above with excellent tolerance.  2. History of mild afebrile neutropenia, which  has resolved. 3. BRCA1 positive 4. Hgb trending down, adequate to treat.   Plan: 1. She will return next week for d1 C4 of taxotere .  2. Appt with Dr. Rayburn Ma 06/25/12 to discuss surgery, she will undergoe bil mrm, wih expander placement with dr Kelly Splinter.   Kalila Adkison,MD

## 2012-06-17 ENCOUNTER — Other Ambulatory Visit: Payer: Self-pay | Admitting: *Deleted

## 2012-06-17 DIAGNOSIS — C50519 Malignant neoplasm of lower-outer quadrant of unspecified female breast: Secondary | ICD-10-CM

## 2012-06-17 MED ORDER — DEXAMETHASONE 4 MG PO TABS: ORAL_TABLET | ORAL | Status: DC

## 2012-06-22 ENCOUNTER — Encounter: Payer: Self-pay | Admitting: Family

## 2012-06-22 ENCOUNTER — Ambulatory Visit (HOSPITAL_BASED_OUTPATIENT_CLINIC_OR_DEPARTMENT_OTHER): Payer: BC Managed Care – PPO

## 2012-06-22 ENCOUNTER — Ambulatory Visit (HOSPITAL_BASED_OUTPATIENT_CLINIC_OR_DEPARTMENT_OTHER): Payer: BC Managed Care – PPO | Admitting: Family

## 2012-06-22 ENCOUNTER — Other Ambulatory Visit (HOSPITAL_BASED_OUTPATIENT_CLINIC_OR_DEPARTMENT_OTHER): Payer: BC Managed Care – PPO | Admitting: Lab

## 2012-06-22 VITALS — BP 134/85 | HR 72 | Temp 98.8°F | Resp 20 | Ht 70.0 in | Wt 171.8 lb

## 2012-06-22 DIAGNOSIS — Z5111 Encounter for antineoplastic chemotherapy: Secondary | ICD-10-CM

## 2012-06-22 DIAGNOSIS — C50919 Malignant neoplasm of unspecified site of unspecified female breast: Secondary | ICD-10-CM

## 2012-06-22 DIAGNOSIS — C50519 Malignant neoplasm of lower-outer quadrant of unspecified female breast: Secondary | ICD-10-CM

## 2012-06-22 DIAGNOSIS — Z1501 Genetic susceptibility to malignant neoplasm of breast: Secondary | ICD-10-CM

## 2012-06-22 LAB — CBC WITH DIFFERENTIAL/PLATELET
BASO%: 0.2 % (ref 0.0–2.0)
Basophils Absolute: 0 10*3/uL (ref 0.0–0.1)
EOS%: 0 % (ref 0.0–7.0)
HGB: 9.8 g/dL — ABNORMAL LOW (ref 11.6–15.9)
MCH: 29.1 pg (ref 25.1–34.0)
MCHC: 31.9 g/dL (ref 31.5–36.0)
MCV: 91.1 fL (ref 79.5–101.0)
MONO%: 2.4 % (ref 0.0–14.0)
RBC: 3.37 10*6/uL — ABNORMAL LOW (ref 3.70–5.45)
RDW: 17.3 % — ABNORMAL HIGH (ref 11.2–14.5)

## 2012-06-22 MED ORDER — ONDANSETRON 8 MG/50ML IVPB (CHCC)
8.0000 mg | Freq: Once | INTRAVENOUS | Status: AC
  Administered 2012-06-22: 8 mg via INTRAVENOUS

## 2012-06-22 MED ORDER — DOCETAXEL CHEMO INJECTION 160 MG/16ML
75.0000 mg/m2 | Freq: Once | INTRAVENOUS | Status: AC
  Administered 2012-06-22: 150 mg via INTRAVENOUS
  Filled 2012-06-22: qty 15

## 2012-06-22 MED ORDER — HEPARIN SOD (PORK) LOCK FLUSH 100 UNIT/ML IV SOLN
500.0000 [IU] | Freq: Once | INTRAVENOUS | Status: AC | PRN
  Administered 2012-06-22: 500 [IU]
  Filled 2012-06-22: qty 5

## 2012-06-22 MED ORDER — SODIUM CHLORIDE 0.9 % IV SOLN
Freq: Once | INTRAVENOUS | Status: AC
  Administered 2012-06-22: 10:00:00 via INTRAVENOUS

## 2012-06-22 MED ORDER — SODIUM CHLORIDE 0.9 % IJ SOLN
10.0000 mL | INTRAMUSCULAR | Status: DC | PRN
  Administered 2012-06-22: 10 mL
  Filled 2012-06-22: qty 10

## 2012-06-22 MED ORDER — DEXAMETHASONE SODIUM PHOSPHATE 10 MG/ML IJ SOLN
10.0000 mg | Freq: Once | INTRAMUSCULAR | Status: AC
  Administered 2012-06-22: 10 mg via INTRAVENOUS

## 2012-06-22 NOTE — Patient Instructions (Addendum)
Return in 1 week for lab and appt with Dr. Donnie Coffin. Appt with Dr. Rayburn Ma 8/23 to discuss surgery.

## 2012-06-22 NOTE — Patient Instructions (Addendum)
[facility] Discharge Instructions for Patients Receiving Chemotherapy  Today you received the following chemotherapy agent Taxotere.  To help prevent nausea and vomiting after your treatment, we encourage you to take your nausea medication. Begin taking your nausea medication as often as prescribed for by Dr. Donnie Coffin.    If you develop nausea and vomiting that is not controlled by your nausea medication, call the clinic. If it is after clinic hours your family physician or the after hours number for the clinic or go to the Emergency Department.   BELOW ARE SYMPTOMS THAT SHOULD BE REPORTED IMMEDIATELY:  *FEVER GREATER THAN 100.5 F  *CHILLS WITH OR WITHOUT FEVER  NAUSEA AND VOMITING THAT IS NOT CONTROLLED WITH YOUR NAUSEA MEDICATION  *UNUSUAL SHORTNESS OF BREATH  *UNUSUAL BRUISING OR BLEEDING  TENDERNESS IN MOUTH AND THROAT WITH OR WITHOUT PRESENCE OF ULCERS  *URINARY PROBLEMS  *BOWEL PROBLEMS  UNUSUAL RASH Items with * indicate a potential emergency and should be followed up as soon as possible.  One of the nurses will contact you 24 hours after your treatment. Please let the nurse know about any problems that you may have experienced. Feel free to call the clinic you have any questions or concerns. The clinic phone number is [phone].   I have been informed and understand all the instructions given to me. I know to contact the clinic, my physician, or go to the Emergency Department if any problems should occur. I do not have any questions at this time, but understand that I may call the clinic during office hours   should I have any questions or need assistance in obtaining follow up care.    __________________________________________  _____________  __________ Signature of Patient or Authorized Representative            Date                   Time    __________________________________________ Nurse's Signature

## 2012-06-22 NOTE — Progress Notes (Signed)
Hematology and Oncology Follow Up Visit  Betty [name] [id number] 12/07/1975 36 y.o. 06/22/2012    HPI: 36 year old Betty [name] [place] woman with: 1. 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 2. History of mild afebrile neutropenia. 3. BRCA1 positive  CURRENT THERAPY: Completed neoadjuvant dose dense FEC  with Neulasta support on day 2, has completed cycle 2/4 planned neoadjuvant dose dense Taxotere with Neulasta support on day 2.  Interim History:   Has completed cycle 3 of DD Taxotere, is excited to complete chemo today. Noticed increased fatigue with cycle 2 & 3, continues to work full-time. No bone pain with last Neulasta injection, used Claritin day before and several days after Neulasta. Has mild numbness and tingling, bilateral hands. No noticeable nail changes on hands or feet. Has had mild headache associated with seasonal allergies, no blurred vision. Mild excessive tearing. No cough or shortness of breath. No abdominal pain or new bone pain. Bowel and bladder function are normal. Appetite is good, with adequate fluid intake. Remainder of the 10 point  review of systems is negative.  Medications:   I have reviewed the patient's current medications.  Allergies:  Allergies  Allergen Reactions  . Dextromethorphan Rash    Physical Exam: Filed Vitals:   06/22/12 0844  BP: 134/85  Pulse: 72  Temp: 98.8 F (37.1 C)  Resp: 20    Body mass index is 24.65 kg/(m^2). Weight: 169 lbs. Lab Results: General: Well developed, well nourished, in no acute distress. Alone at today's visit.   EENT: No ocular or oral lesions. No stomatitis.  Respiratory: Lungs are clear to auscultation bilaterally with normal respiratory movement and no accessory muscle use. Cardiac: No murmur, rub or tachycardia. No upper or lower extremity edema.  GI: Abdomen is soft, no palpable hepatosplenomegaly. No fluid wave. No tenderness. Musculoskeletal: No kyphosis, no tenderness over the spine, ribs or  hips. Lymph: No cervical, infraclavicular, axillary or inguinal adenopathy. Neuro: No focal neurological deficits. Psych: Alert and oriented X 3, appropriate mood and affect.    Lab Results  Component Value Date   WBC 18.8* 06/22/2012   HGB 9.8* 06/22/2012   HCT 30.7* 06/22/2012   MCV 91.1 06/22/2012   PLT 199 06/22/2012   NEUTROABS 17.3* 06/22/2012    Assessment:  1. 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  2. History of mild afebrile neutropenia, resolved. 3. BRCA1 positive 4. Hgb slightly improved. adequate to treat.   Plan: 1. Return 8/27 for lab and appt with Dr. Donnie Coffin.   2. Appt with Dr. Rayburn Ma 06/25/12 to discuss surgery. She has met with Dr. Kelly Splinter to discuss reconstruction.  3. Cycle 4/4 Taxotere today.  Xayvier Vallez, FNP-C

## 2012-06-23 ENCOUNTER — Ambulatory Visit (HOSPITAL_BASED_OUTPATIENT_CLINIC_OR_DEPARTMENT_OTHER): Payer: BC Managed Care – PPO

## 2012-06-23 VITALS — BP 118/69 | HR 65 | Temp 97.9°F

## 2012-06-23 DIAGNOSIS — C50519 Malignant neoplasm of lower-outer quadrant of unspecified female breast: Secondary | ICD-10-CM

## 2012-06-23 DIAGNOSIS — Z5189 Encounter for other specified aftercare: Secondary | ICD-10-CM

## 2012-06-23 MED ORDER — PEGFILGRASTIM INJECTION 6 MG/0.6ML
6.0000 mg | Freq: Once | SUBCUTANEOUS | Status: AC
  Administered 2012-06-23: 6 mg via SUBCUTANEOUS
  Filled 2012-06-23: qty 0.6

## 2012-06-25 ENCOUNTER — Ambulatory Visit (INDEPENDENT_AMBULATORY_CARE_PROVIDER_SITE_OTHER): Payer: BC Managed Care – PPO | Admitting: Surgery

## 2012-06-25 ENCOUNTER — Encounter (INDEPENDENT_AMBULATORY_CARE_PROVIDER_SITE_OTHER): Payer: Self-pay | Admitting: Surgery

## 2012-06-25 VITALS — BP 119/62 | HR 74 | Temp 98.6°F | Resp 18 | Ht 70.0 in | Wt 176.6 lb

## 2012-06-25 DIAGNOSIS — C50919 Malignant neoplasm of unspecified site of unspecified female breast: Secondary | ICD-10-CM

## 2012-06-25 DIAGNOSIS — C50911 Malignant neoplasm of unspecified site of right female breast: Secondary | ICD-10-CM

## 2012-06-25 NOTE — Progress Notes (Signed)
Subjective:     Patient ID: Betty [name], female   DOB: 19-Jul-1976, 36 y.o.   MRN: [id number]  HPI She has now finished her chemotherapy. She has seen Dr. Kelly Splinter in regards to reconstruction after bilateral mastectomy. She is doing well has no complaints. Again, she is BRAC 1 positive  Review of Systems     Objective:   Physical Exam    exam unchanged Assessment:     Right breast cancer    Plan:     She will now be scheduled for bilateral mastectomies with right sentinel lymph node biopsy and immediate reconstruction. This will be coordinated with Dr. Kelly Splinter. I discussed all the risks with her in detail.

## 2012-06-28 ENCOUNTER — Other Ambulatory Visit: Payer: BC Managed Care – PPO | Admitting: Lab

## 2012-06-29 ENCOUNTER — Ambulatory Visit (HOSPITAL_BASED_OUTPATIENT_CLINIC_OR_DEPARTMENT_OTHER): Payer: BC Managed Care – PPO | Admitting: Oncology

## 2012-06-29 ENCOUNTER — Other Ambulatory Visit (HOSPITAL_BASED_OUTPATIENT_CLINIC_OR_DEPARTMENT_OTHER): Payer: BC Managed Care – PPO | Admitting: Lab

## 2012-06-29 ENCOUNTER — Encounter: Payer: Self-pay | Admitting: *Deleted

## 2012-06-29 VITALS — BP 113/77 | HR 82 | Temp 98.7°F | Resp 20 | Ht 70.0 in | Wt 174.7 lb

## 2012-06-29 DIAGNOSIS — D649 Anemia, unspecified: Secondary | ICD-10-CM

## 2012-06-29 DIAGNOSIS — C50919 Malignant neoplasm of unspecified site of unspecified female breast: Secondary | ICD-10-CM

## 2012-06-29 DIAGNOSIS — Z1501 Genetic susceptibility to malignant neoplasm of breast: Secondary | ICD-10-CM

## 2012-06-29 DIAGNOSIS — C50519 Malignant neoplasm of lower-outer quadrant of unspecified female breast: Secondary | ICD-10-CM

## 2012-06-29 LAB — CBC WITH DIFFERENTIAL/PLATELET
BASO%: 1 % (ref 0.0–2.0)
Basophils Absolute: 0.2 10*3/uL — ABNORMAL HIGH (ref 0.0–0.1)
EOS%: 0.2 % (ref 0.0–7.0)
HGB: 9.4 g/dL — ABNORMAL LOW (ref 11.6–15.9)
MCH: 29.2 pg (ref 25.1–34.0)
MCHC: 32.5 g/dL (ref 31.5–36.0)
RDW: 16.7 % — ABNORMAL HIGH (ref 11.2–14.5)
lymph#: 1.7 10*3/uL (ref 0.9–3.3)
nRBC: 2 % — ABNORMAL HIGH (ref 0–0)

## 2012-06-29 MED ORDER — TOBRAMYCIN-DEXAMETHASONE 0.3-0.1 % OP SUSP: 1.0000 [drp] | OPHTHALMIC | Status: AC

## 2012-06-29 NOTE — Progress Notes (Signed)
Hematology and Oncology Follow Up Visit  Betty [name] [id number] [birth year] 36 y.o. 06/29/2012    HPI: 36 year old [name] with: 1. 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 2. History of mild afebrile neutropenia. 3. BRCA1 positive  CURRENT THERAPY: Completed neoadjuvant dose dense FEC  with Neulasta support on day 2, is here today for D7, C3 planned neoadjuvant dose dense Taxotere with Neulasta support on day 2.  Interim History:   She is doing well, without complaints, she works full time. She is tolerating taxotere, with minimal finger pain. . No cough or shortness of breath. No abdominal pain or new bone pain. Bowel and bladder function are normal. Appetite is good, with adequate fluid intake. Remainder of the 10 point  review of systems is negative.  Medications:   I have reviewed the patient's current medications.  Current Outpatient Prescriptions  Medication Sig Dispense Refill  . Chlorpheniramine-PSE-Ibuprofen (ADVIL ALLERGY SINUS PO) Take 1 tablet by mouth as needed. For allergies      . LORazepam (ATIVAN) 0.5 MG tablet Take 1 tablet (0.5 mg total) by mouth every 6 (six) hours as needed (Nausea or vomiting).  30 tablet  0  . Multiple Vitamin (MULTIVITAMIN) tablet Take 1 tablet by mouth daily. chewable      . EPINEPHrine (EPIPEN) 0.3 mg/0.3 mL DEVI Inject 0.3 mg into the muscle once.        . lidocaine-prilocaine (EMLA) cream Apply topically as needed.  30 g  1  . ondansetron (ZOFRAN) 8 MG tablet Take 1 tablet two times a day as needed for nausea or vomiting starting on the third day after chemotherapy.  30 tablet  1    Allergies:  Allergies  Allergen Reactions  . Dextromethorphan Rash    Physical Exam: Filed Vitals:   06/29/12 1030  BP: 113/77  Pulse: 82  Temp: 98.7 F (37.1 C)  Resp: 20    Body mass index is 25.07 kg/(m^2). Weight: 169 lbs. Lab Results: General: Well developed, well nourished, in no acute distress. Accompanied by her  sister.  EENT: No ocular or oral lesions. No stomatitis.  Respiratory: Lungs are clear to auscultation bilaterally with normal respiratory movement and no accessory muscle use. Cardiac: No murmur, rub or tachycardia. No upper or lower extremity edema.  GI: Abdomen is soft, no palpable hepatosplenomegaly. No fluid wave. No tenderness. Musculoskeletal: No kyphosis, no tenderness over the spine, ribs or hips. Lymph: No cervical, infraclavicular, axillary or inguinal adenopathy. Neuro: No focal neurological deficits. Psych: Alert and oriented X 3, appropriate mood and affect.    Lab Results  Component Value Date   WBC 17.3* 06/29/2012   HGB 9.4* 06/29/2012   HCT 28.9* 06/29/2012   MCV 89.8 06/29/2012   PLT 164 06/29/2012   NEUTROABS 13.5* 06/29/2012    Assessment:  1. Newly diagnosed triple-negative right breast carcinoma, receiving treatment as outlined above with excellent tolerance.  2. History of mild afebrile neutropenia, which has resolved. 3. BRCA1 positive 4. Hgb trending down, adequate to treat.   Plan: 1. She will return next week for d1 C4 of taxotere .  2. Appt with Dr. Rayburn Ma 06/25/12 to discuss surgery, she will undergoe bil mrm, wih expander placement with dr Kelly Splinter.   Dagoberto Nealy,MD

## 2012-06-29 NOTE — Progress Notes (Signed)
Saw patient today who says things are going well.  Relates preparing for upcoming surgery.  Plans to continue teaching.  She does not voice any concerns at this time.  She agreed to call with questions, should they arise.

## 2012-07-02 ENCOUNTER — Telehealth: Payer: Self-pay | Admitting: Oncology

## 2012-07-02 NOTE — Telephone Encounter (Signed)
lmonvm adviisng the pt of her sept appts °

## 2012-07-06 ENCOUNTER — Telehealth: Payer: Self-pay | Admitting: *Deleted

## 2012-07-06 NOTE — Telephone Encounter (Signed)
left voice message to inform the patient of the new date and time on 07-12-2012 starting at 8:30am

## 2012-07-09 ENCOUNTER — Telehealth: Payer: Self-pay | Admitting: Emergency Medicine

## 2012-07-09 NOTE — Telephone Encounter (Signed)
Patient called with complaints of eye drainage and discomfort.  Instructed patient she can take OTC Claritin and alternate the Tobradex drops with non medicated moisturizing drops.  Patient verbalized understanding.

## 2012-07-12 ENCOUNTER — Other Ambulatory Visit: Payer: Self-pay | Admitting: Emergency Medicine

## 2012-07-12 ENCOUNTER — Ambulatory Visit (HOSPITAL_BASED_OUTPATIENT_CLINIC_OR_DEPARTMENT_OTHER): Payer: BC Managed Care – PPO | Admitting: Oncology

## 2012-07-12 ENCOUNTER — Telehealth: Payer: Self-pay | Admitting: *Deleted

## 2012-07-12 ENCOUNTER — Other Ambulatory Visit (HOSPITAL_BASED_OUTPATIENT_CLINIC_OR_DEPARTMENT_OTHER): Payer: BC Managed Care – PPO | Admitting: Lab

## 2012-07-12 VITALS — BP 114/70 | HR 66 | Temp 97.2°F | Resp 20 | Ht 70.0 in | Wt 177.0 lb

## 2012-07-12 DIAGNOSIS — Z1501 Genetic susceptibility to malignant neoplasm of breast: Secondary | ICD-10-CM

## 2012-07-12 DIAGNOSIS — C50519 Malignant neoplasm of lower-outer quadrant of unspecified female breast: Secondary | ICD-10-CM

## 2012-07-12 LAB — CBC WITH DIFFERENTIAL/PLATELET
Eosinophils Absolute: 0.1 10*3/uL (ref 0.0–0.5)
MONO#: 0.3 10*3/uL (ref 0.1–0.9)
NEUT#: 2.4 10*3/uL (ref 1.5–6.5)
RBC: 3.5 10*6/uL — ABNORMAL LOW (ref 3.70–5.45)
RDW: 18.4 % — ABNORMAL HIGH (ref 11.2–14.5)
WBC: 3.5 10*3/uL — ABNORMAL LOW (ref 3.9–10.3)
lymph#: 0.7 10*3/uL — ABNORMAL LOW (ref 0.9–3.3)

## 2012-07-12 LAB — COMPREHENSIVE METABOLIC PANEL (CC13)
ALT: 20 U/L (ref 0–55)
Albumin: 3.5 g/dL (ref 3.5–5.0)
CO2: 24 mEq/L (ref 22–29)
Glucose: 90 mg/dl (ref 70–99)
Potassium: 3.8 mEq/L (ref 3.5–5.1)
Sodium: 138 mEq/L (ref 136–145)
Total Protein: 5.8 g/dL — ABNORMAL LOW (ref 6.4–8.3)

## 2012-07-12 NOTE — Progress Notes (Signed)
Hematology and Oncology Follow Up Visit  Betty [name] [id number] 1976-07-15 36 y.o. 07/12/2012    HPI: 36 year old Betty [name] woman with: 1. 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 2. History of mild afebrile neutropenia. 3. BRCA1 positive  CURRENT THERAPY: Completed neoadjuvant dose dense FEC  with Neulasta support on day 2, is here today for D7, C4 planned neoadjuvant dose dense Taxotere with Neulasta support on day 2.  Interim History:   She is doing well, without complaints, she works full time. She is tolerating taxotere, with minimal finger pain. . No cough or shortness of breath. No abdominal pain or new bone pain. Bowel and bladder function are normal. Appetite is good, with adequate fluid intake. She is complaining of persistent eye tearing , despite taking the steroid drops. Remainder of the 10 point  review of systems is negative.  Medications:   I have reviewed the patient's current medications.  Current Outpatient Prescriptions  Medication Sig Dispense Refill  . Chlorpheniramine-PSE-Ibuprofen (ADVIL ALLERGY SINUS PO) Take 1 tablet by mouth as needed. For allergies      . Multiple Vitamin (MULTIVITAMIN) tablet Take 1 tablet by mouth daily. chewable      . EPINEPHrine (EPIPEN) 0.3 mg/0.3 mL DEVI Inject 0.3 mg into the muscle once.        . lidocaine-prilocaine (EMLA) cream Apply topically as needed.  30 g  1  . LORazepam (ATIVAN) 0.5 MG tablet Take 1 tablet (0.5 mg total) by mouth every 6 (six) hours as needed (Nausea or vomiting).  30 tablet  0  . ondansetron (ZOFRAN) 8 MG tablet Take 1 tablet two times a day as needed for nausea or vomiting starting on the third day after chemotherapy.  30 tablet  1    Allergies:  Allergies  Allergen Reactions  . Dextromethorphan Rash    Physical Exam: Filed Vitals:   07/12/12 0913  BP: 114/70  Pulse: 66  Temp: 97.2 F (36.2 C)  Resp: 20    Body mass index is 25.40 kg/(m^2). Weight: 169 lbs. Lab  Results: General: Well developed, well nourished, in no acute distress. Accompanied by her sister.  EENT: No ocular or oral lesions. No stomatitis.  Respiratory: Lungs are clear to auscultation bilaterally with normal respiratory movement and no accessory muscle use. Cardiac: No murmur, rub or tachycardia. No upper or lower extremity edema.  GI: Abdomen is soft, no palpable hepatosplenomegaly. No fluid wave. No tenderness. Musculoskeletal: No kyphosis, no tenderness over the spine, ribs or hips. Lymph: No cervical, infraclavicular, axillary or inguinal adenopathy. Neuro: No focal neurological deficits. Psych: Alert and oriented X 3, appropriate mood and affect.    Lab Results  Component Value Date   WBC 3.5* 07/12/2012   HGB 10.6* 07/12/2012   HCT 31.8* 07/12/2012   MCV 91.0 07/12/2012   PLT 185 07/12/2012   NEUTROABS 2.4 07/12/2012    Assessment:  1. Newly diagnosed triple-negative right breast carcinoma, receiving treatment as outlined above with excellent tolerance.  2. History of mild afebrile neutropenia, which has resolved. 3. BRCA1 positive 4. Hgb trending down, adequate to treat.   Plan: Locally that negative breast cancer status post of oral 8 cycles of chemotherapy. She has seen the surgeon will have bilateral mastectomies and reconstruction on 08/05/2012. Her port will be removed at the same time. I will refer to ophthalmologist for consideration of stenting of tear ducts if appropriate for her current problem. I will also refer back to Dr. Joesph Fillers her gynecologist for consideration of surveillance prior  to nephrectomy which will likely take place about age 71. Currently I would recommend that she undergo a transvaginal ultrasound every 6 months with CA 125 measurements. Current labs are within normal limits I will see her in a month's time after surgery.   Magen Suriano,MD

## 2012-07-12 NOTE — Telephone Encounter (Signed)
08-27-2012 at 1:45pm [email or web address] 07-19-2012 starting at 10:45am

## 2012-07-14 ENCOUNTER — Telehealth: Payer: Self-pay | Admitting: *Deleted

## 2012-07-14 NOTE — Telephone Encounter (Signed)
Pt is c/o swelling in feet and ankles bilaterally. This desk nurse has informed pt that this is an example of "latent side effects" from taxotere. Pt has been informed to call this desk if redness, warmth, and swelling on one side of body larger than the other. Pt has been instructed to elevate feet when able and to be aware of sodium intake.

## 2012-07-19 ENCOUNTER — Ambulatory Visit: Payer: BC Managed Care – PPO | Admitting: Oncology

## 2012-07-19 ENCOUNTER — Other Ambulatory Visit: Payer: BC Managed Care – PPO | Admitting: Lab

## 2012-07-26 ENCOUNTER — Encounter (HOSPITAL_COMMUNITY): Payer: Self-pay | Admitting: Pharmacy Technician

## 2012-07-30 ENCOUNTER — Encounter (HOSPITAL_COMMUNITY): Payer: Self-pay

## 2012-07-30 ENCOUNTER — Other Ambulatory Visit: Payer: Self-pay | Admitting: Plastic Surgery

## 2012-07-30 ENCOUNTER — Encounter: Payer: Self-pay | Admitting: Plastic Surgery

## 2012-07-30 ENCOUNTER — Encounter (HOSPITAL_COMMUNITY)
Admission: RE | Admit: 2012-07-30 | Discharge: 2012-07-30 | Disposition: A | Discharge status: Home / Self Care | Payer: BC Managed Care – PPO | Source: Ambulatory Visit | Attending: Surgery | Admitting: Surgery

## 2012-07-30 ENCOUNTER — Other Ambulatory Visit: Payer: BC Managed Care – PPO | Admitting: Lab

## 2012-07-30 ENCOUNTER — Ambulatory Visit: Payer: BC Managed Care – PPO | Admitting: Oncology

## 2012-07-30 VITALS — BP 121/86 | HR 66 | Temp 99.1°F | Resp 20 | Ht 70.0 in | Wt 174.5 lb

## 2012-07-30 DIAGNOSIS — C50911 Malignant neoplasm of unspecified site of right female breast: Secondary | ICD-10-CM

## 2012-07-30 DIAGNOSIS — C50919 Malignant neoplasm of unspecified site of unspecified female breast: Secondary | ICD-10-CM

## 2012-07-30 LAB — CBC
HCT: 36.6 % (ref 36.0–46.0)
Platelets: 242 10*3/uL (ref 150–400)
RBC: 4.17 MIL/uL (ref 3.87–5.11)
RDW: 14.6 % (ref 11.5–15.5)
WBC: 3.4 10*3/uL — ABNORMAL LOW (ref 4.0–10.5)

## 2012-07-30 LAB — HCG, SERUM, QUALITATIVE: Preg, Serum: NEGATIVE

## 2012-07-30 NOTE — Pre-Procedure Instructions (Signed)
20 Betty [name]  07/30/2012   Your procedure is scheduled on:  August 05, 2012  Report to [facility] at 9:00 AM.  Call this number if you have problems the morning of surgery: [phone]   Remember:   Do not eat food:After Midnight.    Take these medicines the morning of surgery with A SIP OF WATER: none   Do not wear jewelry, make-up or nail polish.  Do not wear lotions, powders, or perfumes. You may wear deodorant.  Do not shave 48 hours prior to surgery. Men may shave face and neck.  Do not bring valuables to the hospital.  Contacts, dentures or bridgework may not be worn into surgery.  Leave suitcase in the car. After surgery it may be brought to your room.  For patients admitted to the hospital, checkout time is 11:00 AM the day of discharge.   Patients discharged the day of surgery will not be allowed to drive home.  Name and phone number of your driver:   Special Instructions: Shower using CHG 2 nights before surgery and the night before surgery.  If you shower the day of surgery use CHG.  Use special wash - you have one bottle of CHG for all showers.  You should use approximately 1/3 of the bottle for each shower.   Please read over the following fact sheets that you were given: Pain Booklet, Coughing and Deep Breathing and Surgical Site Infection Prevention

## 2012-07-30 NOTE — H&P (Signed)
History and Physical  Betty [name]   07/20/2012 8:00 AM Office Visit  MRN: [id number]  Department:  Plastic Surgery  Dept Phone: [phone]  Description: Female DOB: 1976-04-19  Provider: Wayland Denis, DO    Diagnoses  -  Breast cancer   - Primary   174.9     Reason for Visit  -  Breast Reconstruction   Vitals - Last Recorded        128/86  80  97.6 F (36.4 C) (Oral)  1.778 m (5\' 10" )  77.111 kg (170 lb)  24.39 kg/m2     Subjective:    Patient ID: Betty [name] is a 36 y.o. female.  HPI The patient is a 36 yrs old wf here for a history and physical for breast reconstruction. She felt a lump on her left breast and underwent a mammagram, ultrasound, biopsy and MRI. 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 She is currently undergoing neoadjuvant chemotherapy (FEC with Neulasta, Taxotere with Neulasta support). The chemo finished the end of August. She is BRCA1 positive. Her bra size is 36 A and she would like to be around a large B to C. She is 5 feet 10 inches tall and weighs 172 pounds. She breast feed her 59 yrs old son for a few months. No radiation is planned. Her mother had ovarian cancer in her 1s which was 10 years ago. She has a second cousin with breast cancer. Her sister is also BRCA positive.   The following portions of the patient's history were reviewed and updated as appropriate: allergies, current medications, past family history, past medical history, past social history, past surgical history and problem list.  Review of Systems  Constitutional: Negative.   HENT: Negative.   Eyes: Negative.   Respiratory: Negative.   Cardiovascular: Negative.   Gastrointestinal: Negative.   Genitourinary: Negative.   Musculoskeletal: Negative.   Neurological: Negative.   Hematological: Negative.   Psychiatric/Behavioral: Negative.       Objective:    Physical Exam  Constitutional: She appears well-developed and well-nourished.  HENT:   Head: Normocephalic  and atraumatic.  Eyes: Conjunctivae normal and EOM are normal. Pupils are equal, round, and reactive to light.  Neck: Normal range of motion.  Cardiovascular: Normal rate.   Pulmonary/Chest: Effort normal.  Abdominal: Soft. She exhibits no distension. There is no tenderness.  Musculoskeletal: Normal range of motion.  Neurological: She is alert.  Skin: Skin is warm.  Psychiatric: She has a normal mood and affect. Her behavior is normal. Judgment and thought content normal.      Assessment:  1.  Breast cancer     Plan:     We are planning on bilateral immediate breast reconstruction with Expander and FlexHD placement. Risks and complications were reviewed and include bleeding, pain, scar, infection, capsular contracture and risk of anesthesia.      Medications Ordered This Encounter      cephalexin (KEFLEX) 500 MG capsule 28 Take 1 capsule (500 mg total) by mouth 4 times daily.    docusate sodium (COLACE) 100 MG capsule Take 1 capsule (100 mg total) by mouth 3 times daily.    promethazine (PHENERGAN) 12.5 MG tablet 10 Take 2 tablets (25 mg total) by mouth every 6 (six) hours as needed for 7 days for Nausea.    HYDROcodone-acetaminophen (NORCO) 5-325 mg per tablet Take 1 tablet by mouth every 6 (six) hours as needed for 10 days for Pain.    diazepam (VALIUM) 2 MG tablet  30 Take 1 tablet (2 mg total) by mouth every 12 (twelve) hours as needed for 10 days for Anxiety.     Discontinued Medications     HYDROcodone-acetaminophen (NORCO) 5-325 mg per tablet

## 2012-08-03 ENCOUNTER — Other Ambulatory Visit: Payer: Self-pay | Admitting: Plastic Surgery

## 2012-08-04 MED ORDER — CEFAZOLIN SODIUM-DEXTROSE 2-3 GM-% IV SOLR
2.0000 g | INTRAVENOUS | Status: AC
  Administered 2012-08-05: 2 g via INTRAVENOUS
  Filled 2012-08-04: qty 50

## 2012-08-04 NOTE — H&P (Signed)
Betty [name] is an 36 y.o. female.   Chief Complaint: right breast cancer HPI: she presents today for bilateral mastectomy and right sentinel lymph node biopsy, possible port removal, and immediate reconstruction for her triple negative cancer, and genetically positive status.  She is s/p neoadjuvent chemotherapy.  Past Medical History  Diagnosis Date  . Allergic rhinitis   . Allergic conjunctivitis   . BRCA1 positive 03/22/2012  . Cancer     right breast    Past Surgical History  Procedure Date  . Other surgical history     right shoulder x 2  . Inguinal hernia repair     as an infant  . Shoulder surgery 1996    x 2- labrum and capsular repair  . Portacath placement 03/10/2012    Procedure: INSERTION PORT-A-CATH;  Surgeon: Shelly Rubenstein, MD;  Location: [facility];  Service: General;  Laterality: N/A;    Family History  Problem Relation Age of Onset  . Allergic rhinitis      grandmother  . Ovarian cancer Mother 64  . Cancer Mother     ovarian  . Cancer Paternal Uncle     leukemia  . Cancer Paternal Uncle     lung cancer   Social History:  reports that she has never smoked. She has never used smokeless tobacco. She reports that she does not drink alcohol or use illicit drugs.  Allergies:  Allergies  Allergen Reactions  . Dextromethorphan Rash    No prescriptions prior to admission    No results found for this or any previous visit (from the past 48 hour(s)). No results found.  Review of Systems  All other systems reviewed and are negative.    There were no vitals taken for this visit. Physical Exam  Constitutional: She is oriented to person, place, and time. She appears well-developed and well-nourished. No distress.  HENT:  Head: Normocephalic and atraumatic.  Right Ear: External ear normal.  Left Ear: External ear normal.  Eyes: Conjunctivae normal are normal. Pupils are equal, round, and reactive to light. Right eye exhibits no discharge. Left eye  exhibits no discharge.  Neck: Normal range of motion. Neck supple.  Cardiovascular: Normal rate, regular rhythm, normal heart sounds and intact distal pulses.   No murmur heard. Respiratory: Effort normal and breath sounds normal. No respiratory distress. She has no wheezes.  GI: Soft. Bowel sounds are normal. She exhibits no distension. There is no tenderness.  Musculoskeletal: Normal range of motion. She exhibits no edema and no tenderness.  Neurological: She is alert and oriented to person, place, and time.  Skin: Skin is warm and dry. No rash noted. No erythema.  Psychiatric: Her behavior is normal. Judgment normal.     Assessment/Plan Right breast cancer  Will proceed with bilateral mastectomy, right sentinel lymph node biopsy, possible port a cath removal followed by immediate reconstruction by Dr. Kelly Splinter.  The risk that include but are not limited to bleeding, infection, injury to surrounding structures including nerves, etc were discussed.  Lurae Hornbrook A 08/04/2012, 8:01 PM

## 2012-08-05 ENCOUNTER — Ambulatory Visit (HOSPITAL_COMMUNITY): Payer: BC Managed Care – PPO | Admitting: Certified Registered"

## 2012-08-05 ENCOUNTER — Encounter (HOSPITAL_COMMUNITY): Payer: Self-pay | Admitting: Plastic Surgery

## 2012-08-05 ENCOUNTER — Encounter (HOSPITAL_COMMUNITY)
Admission: RE | Disposition: A | Discharge status: Home / Self Care | Payer: Self-pay | Source: Ambulatory Visit | Attending: Surgery

## 2012-08-05 ENCOUNTER — Observation Stay (HOSPITAL_COMMUNITY)
Admission: RE | Admit: 2012-08-05 | Discharge: 2012-08-06 | Disposition: A | Discharge status: Home / Self Care | Payer: BC Managed Care – PPO | Source: Ambulatory Visit | Attending: Surgery | Admitting: Surgery

## 2012-08-05 ENCOUNTER — Encounter (HOSPITAL_COMMUNITY): Payer: Self-pay | Admitting: Certified Registered"

## 2012-08-05 ENCOUNTER — Encounter (HOSPITAL_COMMUNITY): Payer: Self-pay | Admitting: Certified Registered Nurse Anesthetist

## 2012-08-05 ENCOUNTER — Encounter (HOSPITAL_COMMUNITY)
Admission: RE | Admit: 2012-08-05 | Discharge: 2012-08-05 | Disposition: A | Discharge status: Home / Self Care | Payer: BC Managed Care – PPO | Source: Ambulatory Visit | Attending: Surgery | Admitting: Surgery

## 2012-08-05 DIAGNOSIS — C50919 Malignant neoplasm of unspecified site of unspecified female breast: Secondary | ICD-10-CM

## 2012-08-05 DIAGNOSIS — Z452 Encounter for adjustment and management of vascular access device: Secondary | ICD-10-CM

## 2012-08-05 DIAGNOSIS — Z4001 Encounter for prophylactic removal of breast: Secondary | ICD-10-CM

## 2012-08-05 DIAGNOSIS — Z853 Personal history of malignant neoplasm of breast: Secondary | ICD-10-CM

## 2012-08-05 DIAGNOSIS — C50911 Malignant neoplasm of unspecified site of right female breast: Secondary | ICD-10-CM

## 2012-08-05 HISTORY — PX: TISSUE EXPANDER PLACEMENT: SHX2530

## 2012-08-05 HISTORY — PX: PORT-A-CATH REMOVAL: SHX5289

## 2012-08-05 HISTORY — PX: BREAST RECONSTRUCTION: SHX9

## 2012-08-05 HISTORY — PX: MASTECTOMY W/ SENTINEL NODE BIOPSY: SHX2001

## 2012-08-05 SURGERY — SIMPLE MASTECTOMY
Anesthesia: General | Site: Chest | Laterality: Right | Wound class: Clean

## 2012-08-05 MED ORDER — HYDROMORPHONE HCL PF 1 MG/ML IJ SOLN
1.0000 mg | INTRAMUSCULAR | Status: DC | PRN
Start: 2012-08-05 — End: 2012-08-06
  Administered 2012-08-05 – 2012-08-06 (×5): 1 mg via INTRAVENOUS
  Filled 2012-08-05 (×5): qty 1

## 2012-08-05 MED ORDER — MIDAZOLAM HCL 5 MG/5ML IJ SOLN
INTRAMUSCULAR | Status: DC | PRN
  Administered 2012-08-05: 2 mg via INTRAVENOUS

## 2012-08-05 MED ORDER — MORPHINE SULFATE 2 MG/ML IJ SOLN
INTRAMUSCULAR | Status: AC
  Filled 2012-08-05: qty 1

## 2012-08-05 MED ORDER — SODIUM CHLORIDE 0.9 % IR SOLN
Status: DC | PRN
  Administered 2012-08-05: 1

## 2012-08-05 MED ORDER — PANTOPRAZOLE SODIUM 40 MG IV SOLR
40.0000 mg | Freq: Every day | INTRAVENOUS | Status: DC
  Administered 2012-08-05: 40 mg via INTRAVENOUS
  Filled 2012-08-05 (×2): qty 40

## 2012-08-05 MED ORDER — METHYLENE BLUE 1 % INJ SOLN
INTRAMUSCULAR | Status: AC
  Filled 2012-08-05: qty 10

## 2012-08-05 MED ORDER — ACETAMINOPHEN 325 MG PO TABS: 650.0000 mg | ORAL_TABLET | Freq: Four times a day (QID) | ORAL | Status: DC | PRN

## 2012-08-05 MED ORDER — SODIUM CHLORIDE 0.9 % IV SOLN
3.0000 g | Freq: Four times a day (QID) | INTRAVENOUS | Status: DC
  Administered 2012-08-05 – 2012-08-06 (×4): 3 g via INTRAVENOUS
  Filled 2012-08-05 (×8): qty 3

## 2012-08-05 MED ORDER — ACETAMINOPHEN 650 MG RE SUPP: 650.0000 mg | Freq: Four times a day (QID) | RECTAL | Status: DC | PRN

## 2012-08-05 MED ORDER — DOCUSATE SODIUM 100 MG PO CAPS
100.0000 mg | ORAL_CAPSULE | Freq: Three times a day (TID) | ORAL | Status: DC
  Administered 2012-08-05 – 2012-08-06 (×2): 100 mg via ORAL
  Filled 2012-08-05 (×2): qty 1

## 2012-08-05 MED ORDER — PROPOFOL 10 MG/ML IV BOLUS
INTRAVENOUS | Status: DC | PRN
  Administered 2012-08-05: 170 mg via INTRAVENOUS

## 2012-08-05 MED ORDER — 0.9 % SODIUM CHLORIDE (POUR BTL) OPTIME
TOPICAL | Status: DC | PRN
  Administered 2012-08-05: 1000 mL

## 2012-08-05 MED ORDER — KCL IN DEXTROSE-NACL 20-5-0.45 MEQ/L-%-% IV SOLN
INTRAVENOUS | Status: DC
  Administered 2012-08-05 – 2012-08-06 (×2): via INTRAVENOUS
  Filled 2012-08-05 (×4): qty 1000

## 2012-08-05 MED ORDER — ROCURONIUM BROMIDE 100 MG/10ML IV SOLN
INTRAVENOUS | Status: DC | PRN
  Administered 2012-08-05: 50 mg via INTRAVENOUS

## 2012-08-05 MED ORDER — TECHNETIUM TC 99M SULFUR COLLOID FILTERED
1.0000 | Freq: Once | INTRAVENOUS | Status: AC | PRN
  Administered 2012-08-05: 1 via INTRADERMAL

## 2012-08-05 MED ORDER — MEPERIDINE HCL 25 MG/ML IJ SOLN: 6.2500 mg | INTRAMUSCULAR | Status: DC | PRN

## 2012-08-05 MED ORDER — HYDROMORPHONE HCL PF 1 MG/ML IJ SOLN
0.2500 mg | INTRAMUSCULAR | Status: DC | PRN
  Administered 2012-08-05: 0.25 mg via INTRAVENOUS
  Administered 2012-08-05 (×3): 0.5 mg via INTRAVENOUS

## 2012-08-05 MED ORDER — INFLUENZA VIRUS VACC SPLIT PF IM SUSP
0.5000 mL | INTRAMUSCULAR | Status: AC
  Administered 2012-08-06: 0.5 mL via INTRAMUSCULAR
  Filled 2012-08-05: qty 0.5

## 2012-08-05 MED ORDER — OXYCODONE HCL 5 MG PO TABS: 5.0000 mg | ORAL_TABLET | Freq: Once | ORAL | Status: DC | PRN

## 2012-08-05 MED ORDER — ONDANSETRON HCL 4 MG/2ML IJ SOLN
INTRAMUSCULAR | Status: DC | PRN
  Administered 2012-08-05: 4 mg via INTRAVENOUS

## 2012-08-05 MED ORDER — HYDROMORPHONE HCL PF 1 MG/ML IJ SOLN
INTRAMUSCULAR | Status: AC
  Filled 2012-08-05: qty 1

## 2012-08-05 MED ORDER — DEXAMETHASONE SODIUM PHOSPHATE 4 MG/ML IJ SOLN
INTRAMUSCULAR | Status: DC | PRN
  Administered 2012-08-05: 10 mg via INTRAVENOUS

## 2012-08-05 MED ORDER — LIDOCAINE HCL (CARDIAC) 20 MG/ML IV SOLN
INTRAVENOUS | Status: DC | PRN
  Administered 2012-08-05: 80 mg via INTRAVENOUS

## 2012-08-05 MED ORDER — EPHEDRINE SULFATE 50 MG/ML IJ SOLN
INTRAMUSCULAR | Status: DC | PRN
  Administered 2012-08-05: 10 mg via INTRAVENOUS
  Administered 2012-08-05: 5 mg via INTRAVENOUS

## 2012-08-05 MED ORDER — ONDANSETRON HCL 4 MG/2ML IJ SOLN
4.0000 mg | Freq: Four times a day (QID) | INTRAMUSCULAR | Status: DC | PRN
  Administered 2012-08-05 – 2012-08-06 (×2): 4 mg via INTRAVENOUS
  Filled 2012-08-05 (×3): qty 2

## 2012-08-05 MED ORDER — OXYCODONE HCL 5 MG/5ML PO SOLN: 5.0000 mg | Freq: Once | ORAL | Status: DC | PRN

## 2012-08-05 MED ORDER — ADULT MULTIVITAMIN W/MINERALS CH
1.0000 | ORAL_TABLET | Freq: Every day | ORAL | Status: DC
  Administered 2012-08-06: 1 via ORAL
  Filled 2012-08-05: qty 1

## 2012-08-05 MED ORDER — HYDROCODONE-ACETAMINOPHEN 5-325 MG PO TABS
1.0000 | ORAL_TABLET | ORAL | Status: DC | PRN
  Administered 2012-08-05 – 2012-08-06 (×3): 2 via ORAL
  Filled 2012-08-05 (×3): qty 2

## 2012-08-05 MED ORDER — SODIUM CHLORIDE 0.9 % IR SOLN
Status: DC | PRN
  Administered 2012-08-05 (×2)

## 2012-08-05 MED ORDER — MORPHINE SULFATE 2 MG/ML IJ SOLN
2.0000 mg | INTRAMUSCULAR | Status: DC | PRN
Start: 2012-08-05 — End: 2012-08-05
  Administered 2012-08-05: 2 mg via INTRAVENOUS

## 2012-08-05 MED ORDER — FENTANYL CITRATE 0.05 MG/ML IJ SOLN
INTRAMUSCULAR | Status: DC | PRN
  Administered 2012-08-05 (×3): 50 ug via INTRAVENOUS
  Administered 2012-08-05 (×3): 100 ug via INTRAVENOUS
  Administered 2012-08-05: 50 ug via INTRAVENOUS

## 2012-08-05 MED ORDER — LACTATED RINGERS IV SOLN
INTRAVENOUS | Status: DC | PRN
  Administered 2012-08-05 (×2): via INTRAVENOUS

## 2012-08-05 SURGICAL SUPPLY — 56 items
ADH SKN CLS APL DERMABOND .7 (GAUZE/BANDAGES/DRESSINGS) ×4
BAG DECANTER FOR FLEXI CONT (MISCELLANEOUS) ×5 IMPLANT
BINDER BREAST LRG (GAUZE/BANDAGES/DRESSINGS) ×1 IMPLANT
BINDER BREAST XLRG (GAUZE/BANDAGES/DRESSINGS) IMPLANT
BIOPATCH RED 1 DISK 7.0 (GAUZE/BANDAGES/DRESSINGS) ×4 IMPLANT
CANISTER SUCTION 2500CC (MISCELLANEOUS) ×5 IMPLANT
CHLORAPREP W/TINT 26ML (MISCELLANEOUS) ×5 IMPLANT
CLOTH BEACON ORANGE TIMEOUT ST (SAFETY) ×5 IMPLANT
COVER SURGICAL LIGHT HANDLE (MISCELLANEOUS) ×5 IMPLANT
DERMABOND ADVANCED (GAUZE/BANDAGES/DRESSINGS) ×1
DERMABOND ADVANCED .7 DNX12 (GAUZE/BANDAGES/DRESSINGS) ×4 IMPLANT
DRAIN CHANNEL 19F RND (DRAIN) ×6 IMPLANT
DRAPE ORTHO SPLIT 77X108 STRL (DRAPES) ×10
DRAPE PROXIMA HALF (DRAPES) ×11 IMPLANT
DRAPE SURG 17X23 STRL (DRAPES) ×20 IMPLANT
DRAPE SURG ORHT 6 SPLT 77X108 (DRAPES) ×8 IMPLANT
DRAPE WARM FLUID 44X44 (DRAPE) ×5 IMPLANT
DRSG PAD ABDOMINAL 8X10 ST (GAUZE/BANDAGES/DRESSINGS) ×9 IMPLANT
DRSG TEGADERM 4X4.75 (GAUZE/BANDAGES/DRESSINGS) ×1 IMPLANT
ELECT BLADE 4.0 EZ CLEAN MEGAD (MISCELLANEOUS) ×5
ELECT REM PT RETURN 9FT ADLT (ELECTROSURGICAL) ×5
ELECTRODE BLDE 4.0 EZ CLN MEGD (MISCELLANEOUS) ×4 IMPLANT
ELECTRODE REM PT RTRN 9FT ADLT (ELECTROSURGICAL) ×4 IMPLANT
EVACUATOR SILICONE 100CC (DRAIN) ×6 IMPLANT
GLOVE BIO SURGEON STRL SZ 6.5 (GLOVE) ×5 IMPLANT
GLOVE BIOGEL PI IND STRL 7.0 (GLOVE) IMPLANT
GLOVE BIOGEL PI INDICATOR 7.0 (GLOVE) ×1
GLOVE SURG SIGNA 7.5 PF LTX (GLOVE) ×1 IMPLANT
GLOVE SURG SS PI 7.0 STRL IVOR (GLOVE) ×1 IMPLANT
GOWN EXTRA PROTECTION XL (GOWNS) ×1 IMPLANT
GOWN STRL NON-REIN LRG LVL3 (GOWN DISPOSABLE) ×10 IMPLANT
GRAFT FLEX HD 4X16 THICK (Tissue Mesh) ×2 IMPLANT
KIT BASIN OR (CUSTOM PROCEDURE TRAY) ×6 IMPLANT
KIT ROOM TURNOVER OR (KITS) ×5 IMPLANT
NDL HYPO 25X1 1.5 SAFETY (NEEDLE) IMPLANT
NDL SAFETY ECLIPSE 18X1.5 (NEEDLE) IMPLANT
NEEDLE HYPO 18GX1.5 SHARP (NEEDLE) ×5
NEEDLE HYPO 25X1 1.5 SAFETY (NEEDLE) ×5 IMPLANT
NS IRRIG 1000ML POUR BTL (IV SOLUTION) ×10 IMPLANT
PACK GENERAL/GYN (CUSTOM PROCEDURE TRAY) ×5 IMPLANT
PAD ARMBOARD 7.5X6 YLW CONV (MISCELLANEOUS) ×5 IMPLANT
PIN SAFETY STERILE (MISCELLANEOUS) ×5 IMPLANT
SET ASEPTIC TRANSFER (MISCELLANEOUS) ×1 IMPLANT
SPONGE GAUZE 4X4 12PLY (GAUZE/BANDAGES/DRESSINGS) ×4 IMPLANT
STAPLER VISISTAT 35W (STAPLE) IMPLANT
SUT MON AB 5-0 PS2 18 (SUTURE) ×10 IMPLANT
SUT PDS AB 2-0 CT1 27 (SUTURE) ×6 IMPLANT
SUT SILK 3 0 SH 30 (SUTURE) ×6 IMPLANT
SUT VIC AB 3-0 SH 27 (SUTURE) ×15
SUT VIC AB 3-0 SH 27X BRD (SUTURE) ×12 IMPLANT
SUT VICRYL 4-0 PS2 18IN ABS (SUTURE) ×10 IMPLANT
SYR CONTROL 10ML LL (SYRINGE) ×1 IMPLANT
TISSUE EXPANDER 350CC (Miscellaneous) ×2 IMPLANT
TOWEL OR 17X24 6PK STRL BLUE (TOWEL DISPOSABLE) ×5 IMPLANT
TOWEL OR 17X26 10 PK STRL BLUE (TOWEL DISPOSABLE) ×5 IMPLANT
TRAY FOLEY CATH 14FRSI W/METER (CATHETERS) IMPLANT

## 2012-08-05 NOTE — OR Nursing (Signed)
Dr. Kelly Splinter started at 1310.

## 2012-08-05 NOTE — Anesthesia Preprocedure Evaluation (Signed)
Anesthesia Evaluation  Patient identified by MRN, date of birth, ID band Patient awake    Reviewed: Allergy & Precautions, H&P , NPO status , Patient's Chart, lab work & pertinent test results  History of Anesthesia Complications Negative for: history of anesthetic complications  Airway Mallampati: I  Neck ROM: full    Dental No notable dental hx. (+) Teeth Intact   Pulmonary neg pulmonary ROS,  breath sounds clear to auscultation  Pulmonary exam normal       Cardiovascular negative cardio ROS  IRhythm:regular Rate:Normal     Neuro/Psych negative neurological ROS  negative psych ROS   GI/Hepatic negative GI ROS, Neg liver ROS,   Endo/Other  negative endocrine ROS  Renal/GU negative Renal ROS  negative genitourinary   Musculoskeletal   Abdominal   Peds  Hematology negative hematology ROS (+)   Anesthesia Other Findings   Reproductive/Obstetrics negative OB ROS                           Anesthesia Physical Anesthesia Plan  ASA: I  Anesthesia Plan: General and General ETT   Post-op Pain Management:    Induction:   Airway Management Planned:   Additional Equipment:   Intra-op Plan:   Post-operative Plan:   Informed Consent: I have reviewed the patients History and Physical, chart, labs and discussed the procedure including the risks, benefits and alternatives for the proposed anesthesia with the patient or authorized representative who has indicated his/her understanding and acceptance.   Dental Advisory Given  Plan Discussed with: CRNA and Surgeon  Anesthesia Plan Comments:         Anesthesia Quick Evaluation  

## 2012-08-05 NOTE — Anesthesia Postprocedure Evaluation (Signed)
  Anesthesia Post-op Note  Patient: Betty [name]  Procedure(s) Performed: Procedure(s) (LRB) with comments: TISSUE EXPANDER (Bilateral) - With Expanders and Flex HD Placement  BREAST RECONSTRUCTION (Bilateral) SIMPLE MASTECTOMY (Left) MASTECTOMY WITH SENTINEL LYMPH NODE BIOPSY (Right) REMOVAL PORT-A-CATH (N/A)  Patient Location: PACU  Anesthesia Type: General  Level of Consciousness: awake  Airway and Oxygen Therapy: Patient Spontanous Breathing  Post-op Pain: mild  Post-op Assessment: Post-op Vital signs reviewed  Post-op Vital Signs: stable  Complications: No apparent anesthesia complications

## 2012-08-05 NOTE — Progress Notes (Signed)
Patient admitted to 6N04 from PACU. VSS. A&Ox4. Incisions approximated. JP drains charged. C/O of 3 out of 10 pain. RN will continue to monitor.

## 2012-08-05 NOTE — Transfer of Care (Signed)
Immediate Anesthesia Transfer of Care Note  Patient: Betty [name]  Procedure(s) Performed: Procedure(s) (LRB) with comments: TISSUE EXPANDER (Bilateral) - With Expanders and Flex HD Placement  BREAST RECONSTRUCTION (Bilateral) SIMPLE MASTECTOMY (Left) MASTECTOMY WITH SENTINEL LYMPH NODE BIOPSY (Right) REMOVAL PORT-A-CATH (N/A)  Patient Location: PACU  Anesthesia Type: General  Level of Consciousness: awake, alert , oriented and patient cooperative  Airway & Oxygen Therapy: Patient Spontanous Breathing and Patient connected to nasal cannula oxygen  Post-op Assessment: Report given to PACU RN, Post -op Vital signs reviewed and stable and Patient moving all extremities  Post vital signs: Reviewed and stable  Complications: No apparent anesthesia complications

## 2012-08-05 NOTE — Preoperative (Signed)
Beta Blockers   Reason not to administer Beta Blockers:Not Applicable 

## 2012-08-05 NOTE — Op Note (Signed)
SIMPLE MASTECTOMY, MASTECTOMY WITH SENTINEL LYMPH NODE BIOPSY, REMOVAL PORT-A-CATH  Procedure Note  Betty [name] 08/05/2012   Pre-op Diagnosis: Right breast cancer     Post-op Diagnosis: same  Procedure(s): BILATERAL SIMPLE MASTECTOMY RIGHT AXILLARY SENTINEL LYMPH NODE BIOPSY INJECTION OF BLUE DYE REMOVAL OF PORT-A-CATH  Surgeon:  Dr. Carman Ching  Anesthesia: General  Staff:  Jolinda Croak, RN - Scrub Person Maureen Ralphs, RN - Circulator Doy Mince, RN - Circulator Ann Mal Amabile - Scrub Person Hardin Negus, RN - Circulator  Estimated Blood Loss: Minimal               Specimens: bilateral simple mastectomy, right sentinel lymph node x 2  Indications:  This is a 36 year old female with a triple negative right breast cancer who is now status post neoadjuvant chemotherapy. She is BRCA positive. She now presents for bilateral simple mastectomies, right axillary sentinel node biopsy, Port-A-Cath removal, and immediate reconstruction.  Findings: 2 sentinel lymph nodes were identified in the right axilla and were sent to pathology for evaluation. There were no enlarged lymph nodes identified in the axilla  Procedure: The patient was identified in the holding area. The radioactive isotope was injected underneath the areola of the right breast. The patient was then taken to the operating room and identified as the correct patient. She was placed supine on the operating room table and general anesthesia was induced. I then prepped the right areola and injected the blue dye underneath the areola the syringe. I then thoroughly massaged the right breast.  Her bilateral breasts, chest, and axilla were then prepped and draped in the usual sterile fashion. I first proceeded with the left simple mastectomy. I made an elliptical incision going from medial to lateral incorporating the areola. I first dissected out the superior skin flap staying close to the dermis with  electrocautery. Once I made up to the area of the clavicle I was able to easily identify the pocket of the Port-A-Cath. I opened the pocket and cut the sutures and was able to remove the port and its catheter in its entirety. I placed a 2-0 Vicryl suture in the catheter tract. No active bleeding was identified. I then dissected out the inferior skin flap going down to the inframammary ridge with electrocautery. I then dissected the breast tissue off the pectoralis muscle going medial to lateral with electrocautery. Once I got laterally and, I completed the mastectomy with electrocautery just short of the axilla. I marked the breast specimen laterally the suture. It was then sent to pathology for evaluation. Hemostasis appeared to be achieved at the mastectomy site. Next I made An elliptical incision on the right breast going medial to lateral and incorporating the areola. I took this down to the breast tissue the electric cautery. I dissected the superior skin flap all the way to the clavicle and then down to the chest wall. I then dissected the inferior flap going down to the inframammary ridge with electrocautery. I then began removing the breast going medial to lateral taking the breast tissue off the pectoralis muscle. Once I got to the axilla, the neoprobe was brought to the field. I identified 2 lymph nodes in the axilla was contained radioisotope as well as blue dye. These were excised with the cautery and sent to pathology for evaluation. I examined the axilla further and found no other enlarged lymph nodes or any nodes containing blue dye or radioisotope. I then completed a mastectomy with electrocautery  revealing the breast tissue. I marked the specimen with the lateral stitches well. I then thoroughly irrigated both wounds with antibiotic saline and packed the wound with gauze. At this point, Dr. Kelly Splinter presented to the room to start the immediate reconstruction.          Betty [name] A   Date:  08/05/2012  Time: 12:58 PM

## 2012-08-05 NOTE — OR Nursing (Deleted)
Dr. Kelly Splinter started at 1110.

## 2012-08-05 NOTE — H&P (Signed)
History and Physical  Betty [name]   07/20/2012 8:00 AM Office Visit  MRN: [id number]  Department: [facility]  Dept Phone: [phone]  Description: Female DOB: 1975/11/04  Provider: Wayland Denis, DO    Diagnoses  -  Breast cancer   - Primary   174.9     Reason for Visit  -  Breast Reconstruction   Vitals - Last Recorded      128/86  80  97.6 F (36.4 C) (Oral)  1.778 m (5\' 10" )  77.111 kg (170 lb)  24.39 kg/m2   Subjective:    Patient ID: Betty [name] is a 36 y.o. female.  HPI The patient is a 36 yrs old wf here for a history and physical for breast reconstruction. She felt a lump on her left breast and underwent a mammagram, ultrasound, biopsy and MRI. 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 She is currently undergoing neoadjuvant chemotherapy (FEC with Neulasta, Taxotere with Neulasta support). The chemo finished the end of August. She is BRCA1 positive. Her bra size is 36 A and she would like to be around a large B to C. She is 5 feet 10 inches tall and weighs 172 pounds. She breast feed her 71 yrs old son for a few months. No radiation is planned. Her mother had ovarian cancer in her 72s which was 10 years ago. She has a second cousin with breast cancer. Her sister is also BRCA positive.   The following portions of the patient's history were reviewed and updated as appropriate: allergies, current medications, past family history, past medical history, past social history, past surgical history and problem list.  Review of Systems  Constitutional: Negative.   HENT: Negative.   Eyes: Negative.   Respiratory: Negative.   Cardiovascular: Negative.   Gastrointestinal: Negative.   Genitourinary: Negative.   Musculoskeletal: Negative.   Neurological: Negative.   Hematological: Negative.   Psychiatric/Behavioral: Negative.     Objective:    Physical Exam  Constitutional: She appears well-developed and well-nourished.  HENT:   Head: Normocephalic  and atraumatic.  Eyes: Conjunctivae normal and EOM are normal. Pupils are equal, round, and reactive to light.  Neck: Normal range of motion.  Cardiovascular: Normal rate.   Pulmonary/Chest: Effort normal.  Abdominal: Soft. She exhibits no distension. There is no tenderness.  Musculoskeletal: Normal range of motion.  Neurological: She is alert.  Skin: Skin is warm.  Psychiatric: She has a normal mood and affect. Her behavior is normal. Judgment and thought content normal.      Assessment:  1.  Breast cancer       Plan:     We are planning on bilateral immediate breast reconstruction with Expander and FlexHD placement. Risks and complications were reviewed and include bleeding, pain, scar, infection, capsular contracture and risk of anesthesia.    Medications Ordered This Encounter      cephalexin (KEFLEX) 500 MG capsule Take 1 capsule (500 mg total) by mouth 4 times daily.    docusate sodium (COLACE) 100 MG capsule Take 1 capsule (100 mg total) by mouth 3 times daily.    promethazine (PHENERGAN) 12.5 MG tablet Take 2 tablets (25 mg total) by mouth every 6 (six) hours as needed for 7 days for Nausea.     HYDROcodone-acetaminophen (NORCO) 5-325 mg per tablet Take 1 tablet by mouth every 6 (six) hours as needed for 10 days for Pain.    diazepam (VALIUM) 2 MG tablet Take 1 tablet (2 mg total) by  mouth every 12 (twelve) hours as needed for 10 days for Anxiety.     Discontinued Medications     Reason for Discontinue    HYDROcodone-acetaminophen (NORCO) 5-325 mg per tablet

## 2012-08-05 NOTE — OR Nursing (Signed)
Dr. Magnus Ivan ended at 1245.

## 2012-08-05 NOTE — Anesthesia Procedure Notes (Signed)
Procedure Name: Intubation Date/Time: 08/05/2012 11:37 AM Performed by: Rossie Muskrat L Pre-anesthesia Checklist: Patient identified, Timeout performed, Emergency Drugs available, Suction available and Patient being monitored Patient Re-evaluated:Patient Re-evaluated prior to inductionOxygen Delivery Method: Circle system utilized Preoxygenation: Pre-oxygenation with 100% oxygen Intubation Type: IV induction Ventilation: Mask ventilation without difficulty Laryngoscope Size: Miller and 2 Grade View: Grade I Tube type: Oral Tube size: 7.5 mm Number of attempts: 1 Airway Equipment and Method: Stylet Placement Confirmation: ETT inserted through vocal cords under direct vision,  breath sounds checked- equal and bilateral and positive ETCO2 Secured at: 20 cm Tube secured with: Tape Dental Injury: Teeth and Oropharynx as per pre-operative assessment

## 2012-08-05 NOTE — Brief Op Note (Signed)
08/05/2012  2:38 PM  PATIENT:  Betty [name]  36 y.o. female  PRE-OPERATIVE DIAGNOSIS:  Right breast cancer  POST-OPERATIVE DIAGNOSIS:  Right breast cancer  PROCEDURE:  Procedure(s) (LRB) with comments: TISSUE EXPANDER (Bilateral) - With Expanders and Flex HD Placement  BREAST RECONSTRUCTION (Bilateral) SIMPLE MASTECTOMY (Left) MASTECTOMY WITH SENTINEL LYMPH NODE BIOPSY (Right) REMOVAL PORT-A-CATH (N/A)  SURGEON:  Surgeon(s) and Role: Panel 1:    * Shelly Rubenstein, MD - Primary  Panel 2:    * Joaquin Knebel Sanger, DO - Primary  PHYSICIAN ASSISTANT: Shawn Rayburn, PA  ASSISTANTS: none   ANESTHESIA:   general  EBL:  Total I/O In: 1000 [I.V.:1000] Out: 340 [Urine:340]  BLOOD ADMINISTERED:none  DRAINS: (2) Jackson-Pratt drain(s) with closed bulb suction in the breast pocket on each side   LOCAL MEDICATIONS USED:  NONE  SPECIMEN:  No Specimen  DISPOSITION OF SPECIMEN:  N/A  COUNTS:  YES  TOURNIQUET:  * No tourniquets in log *  DICTATION: dictated  PLAN OF CARE: Admit for overnight observation  PATIENT DISPOSITION:  PACU - hemodynamically stable.   Delay start of Pharmacological VTE agent (>24hrs) due to surgical blood loss or risk of bleeding: no

## 2012-08-06 ENCOUNTER — Encounter (HOSPITAL_COMMUNITY): Payer: Self-pay | Admitting: Surgery

## 2012-08-06 MED ORDER — DSS 100 MG PO CAPS: 100.0000 mg | ORAL_CAPSULE | Freq: Three times a day (TID) | ORAL | Status: DC

## 2012-08-06 MED ORDER — ONDANSETRON HCL 4 MG/2ML IJ SOLN
4.0000 mg | Freq: Once | INTRAMUSCULAR | Status: AC
  Administered 2012-08-06: 4 mg via INTRAVENOUS

## 2012-08-06 MED ORDER — PANTOPRAZOLE SODIUM 40 MG PO TBEC: 40.0000 mg | DELAYED_RELEASE_TABLET | Freq: Every day | ORAL | Status: DC

## 2012-08-06 NOTE — Progress Notes (Signed)
1 Day Post-Op  Subjective: Doing well overall. Still requiring IV pain medications and having some nausea.  She has been up walking some.  Bilateral breast incisions are without signs of infection. JP's draining serosang drainage.  Path pending Objective: Vital signs in last 24 hours: Temp:  [96.8 F (36 C)-98.8 F (37.1 C)] 98.7 F (37.1 C) (10/04 0549) Pulse Rate:  [58-78] 74  (10/04 0549) Resp:  [10-18] 16  (10/04 0549) BP: (107-135)/(57-79) 107/57 mmHg (10/04 0549) SpO2:  [92 %-100 %] 100 % (10/04 0549) Weight:  [73.153 kg (161 lb 4.4 oz)] 73.153 kg (161 lb 4.4 oz) (10/03 1617) Last BM Date: 08/05/12  Intake/Output from previous day: 10/03 0701 - 10/04 0700 In: 3620 [P.O.:240; I.V.:3080; IV Piggyback:300] Out: 2045 [Urine:1540; Drains:305; Blood:200] Intake/Output this shift:    General appearance: alert, cooperative, appears stated age and mild distress Resp: clear to auscultation bilaterally Chest wall: Bilateral breast incisions are intact, no erythema, no signs of infection. Cardio: regular rate and rhythm GI: soft, non-tender; bowel sounds normal; no masses,  no organomegaly  Lab Results:  No results found for this basename: WBC:2,HGB:2,HCT:2,PLT:2 in the last 72 hours BMET No results found for this basename: NA:2,K:2,CL:2,CO2:2,GLUCOSE:2,BUN:2,CREATININE:2,CALCIUM:2 in the last 72 hours PT/INR No results found for this basename: LABPROT:2,INR:2 in the last 72 hours ABG No results found for this basename: PHART:2,PCO2:2,PO2:2,HCO3:2 in the last 72 hours  Studies/Results: Nm Sentinel Node Inj-no Rpt (breast)  08/05/2012  CLINICAL DATA: right breast cancer   Sulfur colloid was injected intradermally by the nuclear medicine  technologist for breast cancer sentinel node localization.      Anti-infectives: Anti-infectives     Start     Dose/Rate Route Frequency Ordered Stop   08/05/12 1800  Ampicillin-Sulbactam (UNASYN) 3 g in sodium chloride 0.9 % 100 mL IVPB         3 g 100 mL/hr over 60 Minutes Intravenous Every 6 hours 08/05/12 1611     08/05/12 1034   polymyxin B 500,000 Units, bacitracin 50,000 Units in sodium chloride irrigation 0.9 % 500 mL irrigation  Status:  Discontinued          As needed 08/05/12 1034 08/05/12 1447   08/04/12 1443   ceFAZolin (ANCEF) IVPB 2 g/50 mL premix        2 g 100 mL/hr over 30 Minutes Intravenous 60 min pre-op 08/04/12 1443 08/05/12 1145          Assessment/Plan: s/p Procedure(s) (LRB) with comments: TISSUE EXPANDER (Bilateral) - With Expanders and Flex HD Placement  BREAST RECONSTRUCTION (Bilateral) SIMPLE MASTECTOMY (Left) MASTECTOMY WITH SENTINEL LYMPH NODE BIOPSY (Right) REMOVAL PORT-A-CATH (N/A) Continue pain control and mobilization.  Await path results Continue bilateral JP drains Home once pain under better control.   LOS: 1 day    [facility]  [phone]

## 2012-08-06 NOTE — Progress Notes (Signed)
I agree with the above information and have seen the patient

## 2012-08-06 NOTE — Progress Notes (Signed)
UR completed 

## 2012-08-06 NOTE — Op Note (Signed)
NAME:  Betty [name], Betty [name] NO.:  [id number]  MEDICAL RECORD NO.:  [id number]  LOCATION:[facility]                 FACILITY:  [facility]  PHYSICIAN:  Wayland Denis, DO      DATE OF BIRTH:  02/04/1976  DATE OF PROCEDURE:  08/05/2012 DATE OF DISCHARGE:                              OPERATIVE REPORT   PREOPERATIVE DIAGNOSIS:  Right breast cancer.  POSTOPERATIVE DIAGNOSIS:  Right breast cancer.  PROCEDURE:  Immediate bilateral breast reconstruction with expander placement and Flex HD.  Flex HD is 4 x 16 and expander is 350 mL mentor medium height, smooth expanders.  ATTENDING SURGEON:  Wayland Denis, DO.  ASSISTANT:  Shawn Rayburn, P.A.  ANESTHESIA:  General.  INDICATION FOR PROCEDURE:  The patient is a 36 year old female who was diagnosed with breast cancer on the right and decided to have a bilateral mastectomy.  Risks and complications for reconstruction were reviewed and included bleeding, pain, scar, capsular contracture, infection, need for secondary procedure, failure of the reconstruction, and she wished to proceed.  Consent was signed and confirmed.  DESCRIPTION OF PROCEDURE:  The patient was taken to the operating room, placed on the operating room table in a supine position.  General anesthesia was administered.  Once adequate, a time-out was called and all information was confirmed to be correct.  General Surgery performed bilateral mastectomies and when complete the patient was rendered to the Plastic and Reconstructive Surgery team.  The patient was redraped and a time-out was called.  All information was confirmed to be correct.  The right pocket was irrigated with antibiotic solution.  Hemostasis was achieved with electrocautery.  The Bovie was used to lift the pectoralis major muscle off the chest wall.  Once that was complete, the Flex HD which had been prepared according to the manufacturer guidelines was placed in the pocket and tacked to  the edge of the pectoralis major muscle laterally with 2-0 PDS figure-of-eight and a running suture was utilized.  The shiny side was down and the porous side was superficial. The other end of the Flex HD was tacked to the rectus fascia at the inframammary fold.  The 350 mL mentor medium height expander was prepared according to the manufacturer guidelines.  The air was evacuated and the expander was placed under the pectoralis major muscle. The expander was inflated with 100 mL of normal injectable saline.  The lateral portion of the Flex HD was tacked to the chest wall and a #15 blade was used to make a stab incision to place the drain.  The drain was tacked to the skin of the chest with a 3-0 silk.  The Flex HD was serrated prior to placing it in order to help with drainage.  The deep layers were then closed with 3-0 Vicryl followed by 4-0 Vicryl and a running subcuticular 5-0 Monocryl was used to close the skin.  Dermabond was applied.  The same exact procedure was done on the opposite side, 100 mL of injectable normal saline was placed in 350 mL mentor medium height expander.  Dermabond was applied with ABDs and a breast binder.  The patient tolerated the procedure well. There were no complications.  She was awoken and taken to  recovery room in stable condition.     Wayland Denis, DO     CS/MEDQ  D:  08/06/2012  T:  08/06/2012  Job:  [id number]

## 2012-08-06 NOTE — Discharge Summary (Signed)
Physician Discharge Summary  Patient ID: Betty [name] MRN: [id number] DOB/AGE: [birth year] 36 y.o.  Admit date: 08/05/2012 Discharge date: 08/06/2012  Admission Diagnoses: Right breast cancer  Discharge Diagnoses:  Patient Active Problem List  Diagnosis  . Cancer of lower-outer quadrant of female breast  . ALLERGIC CONJUNCTIVITIS  . ALLERGIC RHINITIS  . Stasis dermatitis without varicosities  . BRCA1 positive   Discharged Condition: good  Hospital Course:   This is a 36 year old female with a triple negative right breast cancer who is now status post neoadjuvant chemotherapy. She is BRCA positive. She underwent bilateral simple mastectomies, right axillary sentinel node biopsy, Port-A-Cath removal, and immediate reconstruction. She did well post operatively and was able to be discharged home with the assistance of her family. She will follow up with Dr. Kelly Splinter and Dr. Magnus Ivan within 1-2 weeks after discharge.   Consults: None  Significant Diagnostic Studies: Pathology from surgery pending at discharge  Treatments: surgery: bilateral simple mastectomy, right sentinel lymph node x 2 by Dr. Magnus Ivan and Immediate bilateral reconstruction with Expanders and Flex HD by Dr. Kelly Splinter Findings: 2 sentinel lymph nodes were identified in the right axilla and were sent to pathology for evaluation. There were no enlarged lymph nodes identified in the axilla Discharge Exam: Blood pressure 103/50, pulse 80, temperature 98.5 F (36.9 C), temperature source Axillary, resp. rate 18, height 5\' 10"  (1.778 m), weight 73.153 kg (161 lb 4.4 oz), last menstrual period 02/04/2012, SpO2 98.00%. General appearance: alert, cooperative, appears stated age and no distress Resp: clear to auscultation bilaterally Cardio: regular rate and rhythm GI: soft, non-tender; bowel sounds normal; no masses,  no organomegaly Pulses: 2+ and symmetric Incision/Wound:Bilateral breast incisions are intact and without warmth,  erythema or induration.  Bilateral JP drains remain in place.   Disposition: 01-Home or Self Care     Medication List     As of 08/06/2012  2:42 PM    TAKE these medications         ADVIL ALLERGY SINUS PO   Take 1 tablet by mouth as needed. For allergies      DSS 100 MG Caps   Take 100 mg by mouth 3 (three) times daily.      EPIPEN 0.3 mg/0.3 mL Devi   Generic drug: EPINEPHrine   Inject 0.3 mg into the muscle once.      multivitamin with minerals Tabs   Take 1 tablet by mouth daily.           Follow-up Information    Follow up with [facility], DO. Call in 1 week. (Call office for follow up next Friday)    Contact information:   [address] [place] [address] [phone]       Follow up with [facility], MD. In 2 weeks.   Contact information:   [address] [place] [address] [phone]          Signed:

## 2012-08-06 NOTE — Progress Notes (Signed)
1 Day Post-Op  Subjective: POD#1 A little sore, but fairly comfortable  Objective: Vital signs in last 24 hours: Temp:  [96.8 F (36 C)-98.8 F (37.1 C)] 98.7 F (37.1 C) (10/04 0549) Pulse Rate:  [58-78] 74  (10/04 0549) Resp:  [10-18] 16  (10/04 0549) BP: (107-135)/(57-79) 107/57 mmHg (10/04 0549) SpO2:  [92 %-100 %] 100 % (10/04 0549) Weight:  [161 lb 4.4 oz (73.153 kg)] 161 lb 4.4 oz (73.153 kg) (10/03 1617) Last BM Date: 08/05/12  Intake/Output from previous day: 10/03 0701 - 10/04 0700 In: 3620 [P.O.:240; I.V.:3080; IV Piggyback:300] Out: 2045 [Urine:1540; Drains:305; Blood:200] Intake/Output this shift:    Lungs clear CV RRR Chest dressings/binder intact, drains serosang  Lab Results:  No results found for this basename: WBC:2,HGB:2,HCT:2,PLT:2 in the last 72 hours BMET No results found for this basename: NA:2,K:2,CL:2,CO2:2,GLUCOSE:2,BUN:2,CREATININE:2,CALCIUM:2 in the last 72 hours PT/INR No results found for this basename: LABPROT:2,INR:2 in the last 72 hours ABG No results found for this basename: PHART:2,PCO2:2,PO2:2,HCO3:2 in the last 72 hours  Studies/Results: Nm Sentinel Node Inj-no Rpt (breast)  08/05/2012  CLINICAL DATA: right breast cancer   Sulfur colloid was injected intradermally by the nuclear medicine  technologist for breast cancer sentinel node localization.      Anti-infectives: Anti-infectives     Start     Dose/Rate Route Frequency Ordered Stop   08/05/12 1800   Ampicillin-Sulbactam (UNASYN) 3 g in sodium chloride 0.9 % 100 mL IVPB        3 g 100 mL/hr over 60 Minutes Intravenous Every 6 hours 08/05/12 1611     08/05/12 1034   polymyxin B 500,000 Units, bacitracin 50,000 Units in sodium chloride irrigation 0.9 % 500 mL irrigation  Status:  Discontinued          As needed 08/05/12 1034 08/05/12 1447   08/04/12 1443   ceFAZolin (ANCEF) IVPB 2 g/50 mL premix        2 g 100 mL/hr over 30 Minutes Intravenous 60 min pre-op 08/04/12 1443  08/05/12 1145          Assessment/Plan: s/p Procedure(s) (LRB) with comments: TISSUE EXPANDER (Bilateral) - With Expanders and Flex HD Placement  BREAST RECONSTRUCTION (Bilateral) SIMPLE MASTECTOMY (Left) MASTECTOMY WITH SENTINEL LYMPH NODE BIOPSY (Right) REMOVAL PORT-A-CATH (N/A)  Continuing current care Awaiting path dispo per Dr. Kelly Splinter  LOS: 1 day    Betty [name] A 08/06/2012

## 2012-08-06 NOTE — Progress Notes (Signed)
Patient discharged to home in care of parents. Medications and instructions reviewed with patient and father with all questions answered. IV d/c'd with cath intact. JP drain care teaching done with demonstration by patient appropriately done. Assessment unchanged from this am. Patient is to follow up with Dr. Kelly Splinter on 10.8.13.

## 2012-08-07 NOTE — Discharge Summary (Signed)
I agree with the above plan and will see the patient in the office.

## 2012-08-10 ENCOUNTER — Ambulatory Visit
Admission: RE | Admit: 2012-08-10 | Discharge: 2012-08-10 | Disposition: A | Discharge status: Home / Self Care | Payer: BC Managed Care – PPO | Source: Ambulatory Visit

## 2012-08-10 ENCOUNTER — Ambulatory Visit (HOSPITAL_COMMUNITY)
Admission: RE | Admit: 2012-08-10 | Discharge: 2012-08-10 | Disposition: A | Discharge status: Home / Self Care | Payer: BC Managed Care – PPO | Source: Ambulatory Visit | Attending: Surgery | Admitting: Surgery

## 2012-08-10 DIAGNOSIS — Z9010 Acquired absence of unspecified breast and nipple: Secondary | ICD-10-CM | POA: Insufficient documentation

## 2012-08-10 DIAGNOSIS — C50919 Malignant neoplasm of unspecified site of unspecified female breast: Secondary | ICD-10-CM | POA: Insufficient documentation

## 2012-08-10 DIAGNOSIS — Z853 Personal history of malignant neoplasm of breast: Secondary | ICD-10-CM

## 2012-08-11 ENCOUNTER — Other Ambulatory Visit: Payer: BC Managed Care – PPO | Admitting: Lab

## 2012-08-11 ENCOUNTER — Telehealth (INDEPENDENT_AMBULATORY_CARE_PROVIDER_SITE_OTHER): Payer: Self-pay | Admitting: General Surgery

## 2012-08-11 ENCOUNTER — Ambulatory Visit: Payer: BC Managed Care – PPO | Admitting: Oncology

## 2012-08-11 ENCOUNTER — Other Ambulatory Visit (INDEPENDENT_AMBULATORY_CARE_PROVIDER_SITE_OTHER): Payer: Self-pay | Admitting: Surgery

## 2012-08-11 DIAGNOSIS — Z853 Personal history of malignant neoplasm of breast: Secondary | ICD-10-CM

## 2012-08-11 NOTE — Telephone Encounter (Signed)
Called pt back and told her that the path is not back yet and once its back and Dr Magnus Ivan or myself will call her

## 2012-08-11 NOTE — Telephone Encounter (Signed)
Message copied by Wilder Glade on Wed Aug 11, 2012 10:17 AM ------      Message from: [name]      Created: Wed Aug 11, 2012 10:06 AM      Regarding: Betty [name]: [phone]       Patient stated that she is waiting on her results from Dr. Magnus Ivan. Please call. Thank you.

## 2012-08-12 ENCOUNTER — Other Ambulatory Visit (INDEPENDENT_AMBULATORY_CARE_PROVIDER_SITE_OTHER): Payer: Self-pay | Admitting: Surgery

## 2012-08-12 DIAGNOSIS — C50919 Malignant neoplasm of unspecified site of unspecified female breast: Secondary | ICD-10-CM

## 2012-08-17 ENCOUNTER — Ambulatory Visit (INDEPENDENT_AMBULATORY_CARE_PROVIDER_SITE_OTHER): Payer: BC Managed Care – PPO | Admitting: Surgery

## 2012-08-17 ENCOUNTER — Encounter (INDEPENDENT_AMBULATORY_CARE_PROVIDER_SITE_OTHER): Payer: Self-pay | Admitting: Surgery

## 2012-08-17 VITALS — BP 106/70 | HR 72 | Temp 98.0°F | Resp 18 | Ht 70.0 in | Wt 172.0 lb

## 2012-08-17 DIAGNOSIS — Z09 Encounter for follow-up examination after completed treatment for conditions other than malignant neoplasm: Secondary | ICD-10-CM

## 2012-08-17 NOTE — Progress Notes (Signed)
Subjective:     Patient ID: Betty [name], female   DOB: [birth year], 36 y.o.   MRN: [id number]  HPI She is here for her first postoperative visit. She just saw Dr. Kelly Splinter this morning. She is doing well has no complaints status post bilateral mastectomies, Port-A-Cath removal, and sentinel node biopsy  Review of Systems     Objective:   Physical Exam Her incisions are healing well. The final pathology showed the right breast to have no residual malignancy. The nodes were negative. There was no evidence of malignancy in the left breast either    Assessment:     Patient stable postop    Plan:     I will see her back in 3 months. She will be calling the oncologist for her followup appointment.

## 2012-09-01 ENCOUNTER — Ambulatory Visit (HOSPITAL_BASED_OUTPATIENT_CLINIC_OR_DEPARTMENT_OTHER): Payer: BC Managed Care – PPO | Admitting: Oncology

## 2012-09-01 ENCOUNTER — Other Ambulatory Visit (HOSPITAL_BASED_OUTPATIENT_CLINIC_OR_DEPARTMENT_OTHER): Payer: BC Managed Care – PPO | Admitting: Lab

## 2012-09-01 VITALS — BP 128/84 | HR 58 | Temp 98.6°F | Resp 20 | Ht 70.0 in | Wt 171.4 lb

## 2012-09-01 DIAGNOSIS — C50519 Malignant neoplasm of lower-outer quadrant of unspecified female breast: Secondary | ICD-10-CM

## 2012-09-01 DIAGNOSIS — Z171 Estrogen receptor negative status [ER-]: Secondary | ICD-10-CM

## 2012-09-01 LAB — CBC WITH DIFFERENTIAL/PLATELET
BASO%: 0.3 % (ref 0.0–2.0)
EOS%: 5.3 % (ref 0.0–7.0)
MCH: 28.2 pg (ref 25.1–34.0)
MCHC: 33.8 g/dL (ref 31.5–36.0)
MONO#: 0.2 10*3/uL (ref 0.1–0.9)
NEUT%: 61.9 % (ref 38.4–76.8)
RBC: 4.21 10*6/uL (ref 3.70–5.45)
RDW: 14.9 % — ABNORMAL HIGH (ref 11.2–14.5)
WBC: 4.3 10*3/uL (ref 3.9–10.3)
lymph#: 1.2 10*3/uL (ref 0.9–3.3)

## 2012-09-01 LAB — COMPREHENSIVE METABOLIC PANEL
ALT: 13 U/L (ref 0–35)
AST: 23 U/L (ref 0–37)
CO2: 23 mEq/L (ref 19–32)
Calcium: 9.7 mg/dL (ref 8.4–10.5)
Chloride: 99 mEq/L (ref 96–112)
Creatinine, Ser: 0.66 mg/dL (ref 0.50–1.10)
Potassium: 3.9 mEq/L (ref 3.5–5.3)
Sodium: 136 mEq/L (ref 135–145)
Total Protein: 7.4 g/dL (ref 6.0–8.3)

## 2012-09-01 NOTE — Progress Notes (Signed)
Hematology and Oncology Follow Up Visit  Betty [name] [id number] 1976-10-16 36 y.o. 09/01/2012    HPI: 36 year old Betty [name] [place] woman with: 1. Triple-negative right breast carcinoma, BRCA1 positive, Status post 4 cycles of dose dense FEC followed by dose dense Taxotere x4 cycles. Status post surgery 08/04/2012 with complete pathological response. Recent expander placement.  CURRENT THERAPY: None  Interim History:   Patient is doing well. She has recovered well from surgery. She has been off from work for most of the month and was will be returning shortly. She has no late sequela from recent treatment. Medications:   I have reviewed the patient's current medications.  Current Outpatient Prescriptions  Medication Sig Dispense Refill  . Chlorpheniramine-PSE-Ibuprofen (ADVIL ALLERGY SINUS PO) Take 1 tablet by mouth as needed. For allergies      . docusate sodium 100 MG CAPS Take 100 mg by mouth 3 (three) times daily.  10 capsule    . EPINEPHrine (EPIPEN) 0.3 mg/0.3 mL DEVI Inject 0.3 mg into the muscle once.        Marland Kitchen HYDROcodone-acetaminophen (NORCO/VICODIN) 5-325 MG per tablet       . Multiple Vitamin (MULTIVITAMIN WITH MINERALS) TABS Take 1 tablet by mouth daily.        Allergies:  Allergies  Allergen Reactions  . Dextromethorphan Rash    Physical Exam: Filed Vitals:   09/01/12 1627  BP: 128/84  Pulse: 58  Temp: 98.6 F (37 C)  Resp: 20    Body mass index is 24.59 kg/(m^2). Weight: 169 lbs. Lab Results: General: Well developed, well nourished, in no acute distress. Accompanied by her sister.  EENT: No ocular or oral lesions. No stomatitis.  Respiratory: Lungs are clear to auscultation bilaterally with normal respiratory movement and no accessory muscle use. Cardiac: No murmur, rub or tachycardia. No upper or lower extremity edema.  GI: Abdomen is soft, no palpable hepatosplenomegaly. No fluid wave. No tenderness. Musculoskeletal: No kyphosis, no  tenderness over the spine, ribs or hips. Lymph: No cervical, infraclavicular, axillary or inguinal adenopathy. Neuro: No focal neurological deficits. Psych: Alert and oriented X 3, appropriate mood and affect.  Status post bilateral mastectomies, expander is in place. No obvious evidence a recent of recurrence  Lab Results  Component Value Date   WBC 4.3 09/01/2012   HGB 11.9 09/01/2012   HCT 35.2 09/01/2012   MCV 83.5 09/01/2012   PLT 266 09/01/2012   NEUTROABS 2.7 09/01/2012    Assessment:  Patient is doing well. We spent some time reviewing her pathology. The left breast was essentially normal, the right breast has fibrocystic changes and chronic inflammation. 2 sentinel lymph nodes were removed both of which were negative for malignancy. She was originally triple-negative will not be requiring any form of adjuvant hormonal therapy.   Plan: I plan to see the patient back in 3 months. I once again indicated that she would likely benefit from oophorectomy closer to the age of 78. Prior to that she sees Dr. Marcelle Overlie she should see her for surveillance exams, possibly transvaginal ultrasound every 6 months with CEA 125 measurements. Current labs are within normal limits I will see her in 3 month's time after surgery.   Dilpreet Faires,MD

## 2012-09-02 ENCOUNTER — Telehealth: Payer: Self-pay | Admitting: *Deleted

## 2012-09-02 NOTE — Telephone Encounter (Signed)
Mailed out calendar to inform the patient of the new date and time 

## 2012-11-04 ENCOUNTER — Telehealth: Payer: Self-pay | Admitting: *Deleted

## 2012-11-04 NOTE — Telephone Encounter (Signed)
patient confirmed over the phone the new date and time the appointment has been cancelled

## 2012-11-17 ENCOUNTER — Encounter (INDEPENDENT_AMBULATORY_CARE_PROVIDER_SITE_OTHER): Payer: Self-pay | Admitting: Surgery

## 2012-11-17 ENCOUNTER — Ambulatory Visit (INDEPENDENT_AMBULATORY_CARE_PROVIDER_SITE_OTHER): Payer: BC Managed Care – PPO | Admitting: Surgery

## 2012-11-17 VITALS — BP 118/64 | HR 72 | Temp 97.2°F | Resp 16 | Ht 70.0 in | Wt 175.6 lb

## 2012-11-17 DIAGNOSIS — Z853 Personal history of malignant neoplasm of breast: Secondary | ICD-10-CM

## 2012-11-17 NOTE — Progress Notes (Signed)
Subjective:     Patient ID: Betty [name], female   DOB: 25-Oct-1976, 37 y.o.   MRN: [id number]  HPI She is here for a 3 month followup status post bilateral mastectomies for invasive breast cancer. She had immediate reconstruction. She just saw all Dr. Krista Blue yesterday and had more fluid placed in the expanders. She is doing well and has no complaints.  Review of Systems     Objective:   Physical Exam On exam, her incisions are healing well. There is no adenopathy on either side    Assessment:     The patient remained stable    Plan:     I will see her back in 6 months

## 2012-11-29 ENCOUNTER — Encounter: Payer: Self-pay | Admitting: Oncology

## 2012-11-29 ENCOUNTER — Telehealth: Payer: Self-pay | Admitting: *Deleted

## 2012-11-29 NOTE — Telephone Encounter (Signed)
Pt called stating that she has not received a letter nor a call about who will be following her and I confirmed 12/20/12 appt w/ pt.

## 2012-12-20 ENCOUNTER — Ambulatory Visit (HOSPITAL_BASED_OUTPATIENT_CLINIC_OR_DEPARTMENT_OTHER): Payer: BC Managed Care – PPO | Admitting: Gynecologic Oncology

## 2012-12-20 ENCOUNTER — Encounter: Payer: Self-pay | Admitting: Gynecologic Oncology

## 2012-12-20 ENCOUNTER — Ambulatory Visit (HOSPITAL_BASED_OUTPATIENT_CLINIC_OR_DEPARTMENT_OTHER): Payer: BC Managed Care – PPO | Admitting: Lab

## 2012-12-20 VITALS — BP 124/82 | Temp 97.9°F

## 2012-12-20 DIAGNOSIS — Z1501 Genetic susceptibility to malignant neoplasm of breast: Secondary | ICD-10-CM

## 2012-12-20 DIAGNOSIS — Z171 Estrogen receptor negative status [ER-]: Secondary | ICD-10-CM

## 2012-12-20 DIAGNOSIS — C50919 Malignant neoplasm of unspecified site of unspecified female breast: Secondary | ICD-10-CM

## 2012-12-20 DIAGNOSIS — Z853 Personal history of malignant neoplasm of breast: Secondary | ICD-10-CM

## 2012-12-20 DIAGNOSIS — Z9010 Acquired absence of unspecified breast and nipple: Secondary | ICD-10-CM

## 2012-12-20 LAB — CBC WITH DIFFERENTIAL/PLATELET
Basophils Absolute: 0 10*3/uL (ref 0.0–0.1)
Eosinophils Absolute: 0.3 10*3/uL (ref 0.0–0.5)
HGB: 14 g/dL (ref 11.6–15.9)
MCV: 83.6 fL (ref 79.5–101.0)
MONO%: 6 % (ref 0.0–14.0)
NEUT#: 2.9 10*3/uL (ref 1.5–6.5)
RDW: 13.7 % (ref 11.2–14.5)

## 2012-12-20 LAB — COMPREHENSIVE METABOLIC PANEL (CC13)
ALT: 18 U/L (ref 0–55)
Albumin: 4.3 g/dL (ref 3.5–5.0)
CO2: 25 mEq/L (ref 22–29)
Calcium: 10.1 mg/dL (ref 8.4–10.4)
Chloride: 104 mEq/L (ref 98–107)
Creatinine: 0.8 mg/dL (ref 0.6–1.1)
Potassium: 3.9 mEq/L (ref 3.5–5.1)
Sodium: 140 mEq/L (ref 136–145)
Total Protein: 7.9 g/dL (ref 6.4–8.3)

## 2012-12-20 NOTE — Progress Notes (Signed)
ID: Betty [name]   DOB: [birth year]  MR#: [id number]  CSN#:[id number]  PCP: Ailene Ravel, MD GYN: Dr. Marcelle Overlie SU: Dr. Barrie Dunker OTHER MD:  Allergist: Dr. Maple Hudson  HISTORY OF PRESENT ILLNESS: Betty [name] is a 37 year old [name] from [facility], BRCA 1 positive, who presented with a painful, palpable mass in her right breast that she first noticed in January 2013. A bilateral mammogram and right breast ultrasound performed on 02/20/2012 showed an irregular hypoechoic mass at 8 o clock position measuring 2.1 x 1.1 x 1.6 cm.  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  MRI scan performed on 02/27/2012 showed this mass to be 2.2 x 1.6 x 1.9 cm. No other masses were seen.  She underwent 4 cycles of FEC treatment from 03/15/12 to 04/27/12 followed by 4 cycles of Taxotere from 05/11/12 to 06/22/12.  She underwent bilateral simple mastectomies with right axillary sentinel lymph node biopsy by Dr. Magnus Ivan on 08/05/12 with complete pathological response.    INTERVAL HISTORY:  She presents today for continued follow-up.  She reports intermittent hot flashes that are tolerable.  She reports that Dr. Marcelle Overlie, her gynecologist, checked lab work recently that showed she was peri-menopausal.  She states that she is under close surveillance with Dr. Marcelle Overlie and that she plans to have a prophylactic oophorectomy around age 39 since she is BRCA 1 positive.  She recently underwent expander placement.  REVIEW OF SYSTEMS: Constitutional: Feels well.  Cardiovascular: No chest pain, shortness of breath, or edema.  Pulmonary:  Recent dry cough noted but relates to allergies.  No wheeze.  Gastrointestinal: No nausea, vomiting, or diarrhea. No bright red blood per rectum or change in bowel movement.  Genitourinary: No frequency, urgency, or dysuria. No vaginal bleeding or discharge.  Musculoskeletal: No  myalgia or joint pain. Neurologic: No weakness, numbness, or change in gait.  Psychology: No depression, anxiety, or insomnia  PAST MEDICAL HISTORY: Past Medical History  Diagnosis Date  . Allergic rhinitis   . Allergic conjunctivitis   . BRCA1 positive 03/22/2012  . Cancer     right breast    PAST SURGICAL HISTORY: Past Surgical History  Procedure Laterality Date  . Other surgical history      right shoulder x 2  . Inguinal hernia repair      as an infant  . Shoulder surgery  1996    x 2- labrum and capsular repair  . Portacath placement  03/10/2012    Procedure: INSERTION PORT-A-CATH;  Surgeon: Shelly Rubenstein, MD;  Location: [facility];  Service: General;  Laterality: N/A;  . Mastectomy w/ sentinel node biopsy  08/05/2012    Procedure: MASTECTOMY WITH SENTINEL LYMPH NODE BIOPSY;  Surgeon: Shelly Rubenstein, MD;  Location: [facility];  Service: General;  Laterality: Right;  . Port-a-cath removal  08/05/2012    Procedure: REMOVAL PORT-A-CATH;  Surgeon: Shelly Rubenstein, MD;  Location: [facility] OR;  Service: General;  Laterality: N/A;  . Tissue expander placement  08/05/2012    Procedure: TISSUE EXPANDER;  Surgeon: Wayland Denis, DO;  Location: [facility] OR;  Service: Plastics;  Laterality: Bilateral;  With Expanders and Flex HD Placement   . Breast reconstruction  08/05/2012    Procedure: BREAST RECONSTRUCTION;  Surgeon: Wayland Denis, DO;  Location: [facility] OR;  Service: Plastics;  Laterality: Bilateral;    FAMILY HISTORY Family History  Problem Relation Age of Onset  .  Allergic rhinitis      grandmother  . Ovarian cancer Mother 60  . Cancer Mother     ovarian  . Cancer Paternal Uncle     leukemia  . Cancer Paternal Uncle     lung cancer    GYNECOLOGIC HISTORY:  Married with one child, age 76.    SOCIAL HISTORY: Art therapist, previously Runner, broadcasting/film/video for x10 years   HEALTH MAINTENANCE: History  Substance Use Topics  . Smoking status: Never Smoker   . Smokeless tobacco: Never  Used  . Alcohol Use: Yes     Comment: occas    PAP:  2013 with Dr. Marcelle Overlie  Bone density: N/A  Lipid panel:  Manage by Dr. Nathanial Rancher  Allergies  Allergen Reactions  . Dextromethorphan Rash    Current Outpatient Prescriptions  Medication Sig Dispense Refill  . Chlorpheniramine-PSE-Ibuprofen (ADVIL ALLERGY SINUS PO) Take 1 tablet by mouth as needed. For allergies      . EPINEPHrine (EPIPEN) 0.3 mg/0.3 mL DEVI Inject 0.3 mg into the muscle once.        . Multiple Vitamin (MULTIVITAMIN WITH MINERALS) TABS Take 1 tablet by mouth daily.      Marland Kitchen docusate sodium 100 MG CAPS Take 100 mg by mouth 3 (three) times daily.  10 capsule     No current facility-administered medications for this visit.    OBJECTIVE: Filed Vitals:   12/20/12 0955  BP: 124/82  Temp: 97.9 F (36.6 C)     There is no weight on file to calculate BMI.    ECOG FS: Symptomatic but completely ambulatory  General: Well developed, well nourished female in no acute distress. Alert and oriented x 3.  Head/ Neck: Oropharynx clear.  Sclerae anicteric.  Supple without any enlargements.  Neuro: Non-focal. Lymph node survey: No cervical, supraclavicular, or axillary adenopathy  Cardiovascular: Regular rate and rhythm. S1 and S2 normal.  Lungs: Clear to auscultation bilaterally. No wheezes/crackles/rhonchi noted.  Skin: No rashes or lesions present. Back: No CVA tenderness. MSK no focal spinal tenderness, no peripheral edema Abdomen: Abdomen soft, non-tender and obese. Active bowel sounds in all quadrants. No evidence of organomegaly or abdominal masses.  Breasts:  Bilateral mastectomy scars well healed.  Bilateral expanders in place.  No nodularity, masses, erythema, or discharge noted bilaterally. Extremities: No bilateral cyanosis, edema, or clubbing.   LAB RESULTS: Lab Results  Component Value Date   WBC 4.3 09/01/2012   NEUTROABS 2.7 09/01/2012   HGB 11.9 09/01/2012   HCT 35.2 09/01/2012   MCV 83.5 09/01/2012   PLT  266 09/01/2012      Chemistry      Component Value Date/Time   NA 136 09/01/2012 1641   NA 138 07/12/2012 0845   K 3.9 09/01/2012 1641   K 3.8 07/12/2012 0845   CL 99 09/01/2012 1641   CL 106 07/12/2012 0845   CO2 23 09/01/2012 1641   CO2 24 07/12/2012 0845   BUN 12 09/01/2012 1641   BUN 10.0 07/12/2012 0845   CREATININE 0.66 09/01/2012 1641   CREATININE 0.8 07/12/2012 0845      Component Value Date/Time   CALCIUM 9.7 09/01/2012 1641   CALCIUM 9.1 07/12/2012 0845   ALKPHOS 104 09/01/2012 1641   ALKPHOS 68 07/12/2012 0845   AST 23 09/01/2012 1641   AST 20 07/12/2012 0845   ALT 13 09/01/2012 1641   ALT 20 07/12/2012 0845   BILITOT 0.1* 09/01/2012 1641   BILITOT 0.40 07/12/2012 0845  Lab Results  Component Value Date   LABCA2 22 03/03/2012    No components found with this basename: [id number]    No results found for this basename: INR,  in the last 168 hours  Urinalysis No results found for this basename: colorurine, appearanceur, labspec, phurine, glucoseu, hgbur, bilirubinur, ketonesur, proteinur, urobilinogen, nitrite, leukocytesur    STUDIES: No results found.  ASSESSMENT: 37 y.o. Betty [name]: #1  Biopsy on 02/20/12 showed clinical T2, N0, M0 IDC, ER neg, PR neg, Ki67 98%, HER2 ratio of 1.28 and MRI revealed a 2.2cm mass in the right breast.  #2  Completed 4 cycles of FEC from 03/15/12 to 04/27/12 followed by 4 cycles of Taxotere from 05/11/12 to 06/22/12.  #3  Bilateral simple mastectomies with right axillary sentinel lymph node biopsy on 08/05/12 by Dr. Magnus Ivan with complete pathological response.  PLAN:  She is to return to see Dr. Darnelle Catalan in 6 months or sooner if problems arise.  She will be notified of the results from her lab work today including CA 125, CA 27.29, CBC, and Cmet.  She is to continue to follow up with Dr. Marcelle Overlie with plans for a future oophorectomy.  The patient was reviewed with Dr. Darnelle Catalan, who also spoke with the patient about future plans and  recommendations. Betty [name]    12/20/2012

## 2012-12-20 NOTE — Patient Instructions (Signed)
Doing well.  We will contact you with your lab results from today.  Plan to follow up with Dr. Darnelle Catalan in 6 months or sooner if needed.

## 2012-12-21 LAB — CA 125: CA 125: 4.6 U/mL (ref 0.0–30.2)

## 2012-12-31 ENCOUNTER — Other Ambulatory Visit: Payer: BC Managed Care – PPO | Admitting: Lab

## 2012-12-31 ENCOUNTER — Ambulatory Visit: Payer: BC Managed Care – PPO | Admitting: Oncology

## 2013-02-16 NOTE — Telephone Encounter (Signed)
Erroneous

## 2013-02-28 ENCOUNTER — Encounter (HOSPITAL_BASED_OUTPATIENT_CLINIC_OR_DEPARTMENT_OTHER): Payer: Self-pay | Admitting: *Deleted

## 2013-02-28 NOTE — Progress Notes (Signed)
NPO AFTER MN. ARRIVES AT 0715. NEEDS HG. 

## 2013-03-02 ENCOUNTER — Other Ambulatory Visit: Payer: Self-pay | Admitting: Plastic Surgery

## 2013-03-02 ENCOUNTER — Encounter (HOSPITAL_BASED_OUTPATIENT_CLINIC_OR_DEPARTMENT_OTHER): Payer: Self-pay | Admitting: Anesthesiology

## 2013-03-02 ENCOUNTER — Encounter (HOSPITAL_BASED_OUTPATIENT_CLINIC_OR_DEPARTMENT_OTHER)
Admission: RE | Disposition: A | Discharge status: Home / Self Care | Payer: Self-pay | Source: Ambulatory Visit | Attending: Plastic Surgery

## 2013-03-02 ENCOUNTER — Ambulatory Visit (HOSPITAL_BASED_OUTPATIENT_CLINIC_OR_DEPARTMENT_OTHER): Payer: BC Managed Care – PPO | Admitting: Anesthesiology

## 2013-03-02 ENCOUNTER — Ambulatory Visit (HOSPITAL_BASED_OUTPATIENT_CLINIC_OR_DEPARTMENT_OTHER)
Admission: RE | Admit: 2013-03-02 | Discharge: 2013-03-02 | Disposition: A | Discharge status: Home / Self Care | Payer: BC Managed Care – PPO | Source: Ambulatory Visit | Attending: Plastic Surgery | Admitting: Plastic Surgery

## 2013-03-02 DIAGNOSIS — Z171 Estrogen receptor negative status [ER-]: Secondary | ICD-10-CM | POA: Insufficient documentation

## 2013-03-02 DIAGNOSIS — Z9010 Acquired absence of unspecified breast and nipple: Secondary | ICD-10-CM | POA: Insufficient documentation

## 2013-03-02 DIAGNOSIS — Z9013 Acquired absence of bilateral breasts and nipples: Secondary | ICD-10-CM

## 2013-03-02 DIAGNOSIS — Z853 Personal history of malignant neoplasm of breast: Secondary | ICD-10-CM | POA: Insufficient documentation

## 2013-03-02 DIAGNOSIS — Z421 Encounter for breast reconstruction following mastectomy: Secondary | ICD-10-CM | POA: Insufficient documentation

## 2013-03-02 DIAGNOSIS — Z79899 Other long term (current) drug therapy: Secondary | ICD-10-CM | POA: Insufficient documentation

## 2013-03-02 HISTORY — DX: Malignant neoplasm of unspecified site of unspecified female breast: C50.919

## 2013-03-02 HISTORY — DX: Genetic susceptibility to malignant neoplasm of breast: Z15.01

## 2013-03-02 LAB — POCT PREGNANCY, URINE: Preg Test, Ur: NEGATIVE

## 2013-03-02 LAB — POCT HEMOGLOBIN-HEMACUE: Hemoglobin: 13.2 g/dL (ref 12.0–15.0)

## 2013-03-02 SURGERY — REMOVAL, TISSUE EXPANDER, BREAST, BILATERAL, WITH BILATERAL IMPLANT IMPLANT INSERTION
Anesthesia: General | Site: Breast | Laterality: Bilateral | Wound class: Clean

## 2013-03-02 MED ORDER — LIDOCAINE HCL (CARDIAC) 20 MG/ML IV SOLN
INTRAVENOUS | Status: DC | PRN
  Administered 2013-03-02: 75 mg via INTRAVENOUS

## 2013-03-02 MED ORDER — CEFAZOLIN SODIUM-DEXTROSE 2-3 GM-% IV SOLR
2.0000 g | INTRAVENOUS | Status: AC
  Administered 2013-03-02: 2 g via INTRAVENOUS
  Filled 2013-03-02: qty 50

## 2013-03-02 MED ORDER — SODIUM CHLORIDE 0.9 % IR SOLN
Status: DC | PRN
  Administered 2013-03-02: 09:00:00

## 2013-03-02 MED ORDER — LACTATED RINGERS IV SOLN
INTRAVENOUS | Status: DC
  Administered 2013-03-02: 100 mL/h via INTRAVENOUS
  Administered 2013-03-02: 10:00:00 via INTRAVENOUS
  Filled 2013-03-02: qty 1000

## 2013-03-02 MED ORDER — EPHEDRINE SULFATE 50 MG/ML IJ SOLN
INTRAMUSCULAR | Status: DC | PRN
  Administered 2013-03-02 (×4): 10 mg via INTRAVENOUS

## 2013-03-02 MED ORDER — HYDROMORPHONE HCL PF 1 MG/ML IJ SOLN
0.2500 mg | INTRAMUSCULAR | Status: DC | PRN
  Filled 2013-03-02: qty 1

## 2013-03-02 MED ORDER — ONDANSETRON HCL 4 MG/2ML IJ SOLN
INTRAMUSCULAR | Status: DC | PRN
  Administered 2013-03-02: 4 mg via INTRAVENOUS

## 2013-03-02 MED ORDER — PROMETHAZINE HCL 25 MG/ML IJ SOLN
6.2500 mg | INTRAMUSCULAR | Status: DC | PRN
  Filled 2013-03-02: qty 1

## 2013-03-02 MED ORDER — DEXAMETHASONE SODIUM PHOSPHATE 4 MG/ML IJ SOLN
INTRAMUSCULAR | Status: DC | PRN
  Administered 2013-03-02: 10 mg via INTRAVENOUS

## 2013-03-02 MED ORDER — MIDAZOLAM HCL 5 MG/5ML IJ SOLN
INTRAMUSCULAR | Status: DC | PRN
  Administered 2013-03-02 (×2): 1 mg via INTRAVENOUS

## 2013-03-02 MED ORDER — BUPIVACAINE-EPINEPHRINE 0.25% -1:200000 IJ SOLN
INTRAMUSCULAR | Status: DC | PRN
  Administered 2013-03-02: 10 mL

## 2013-03-02 MED ORDER — PROPOFOL 10 MG/ML IV BOLUS
INTRAVENOUS | Status: DC | PRN
  Administered 2013-03-02: 250 mg via INTRAVENOUS

## 2013-03-02 MED ORDER — ACETAMINOPHEN 10 MG/ML IV SOLN
INTRAVENOUS | Status: DC | PRN
  Administered 2013-03-02: 1000 mg via INTRAVENOUS

## 2013-03-02 MED ORDER — FENTANYL CITRATE 0.05 MG/ML IJ SOLN
INTRAMUSCULAR | Status: DC | PRN
  Administered 2013-03-02 (×2): 25 ug via INTRAVENOUS
  Administered 2013-03-02: 50 ug via INTRAVENOUS
  Administered 2013-03-02 (×2): 25 ug via INTRAVENOUS
  Administered 2013-03-02: 12.5 ug via INTRAVENOUS
  Administered 2013-03-02 (×2): 25 ug via INTRAVENOUS
  Administered 2013-03-02: 12.5 ug via INTRAVENOUS

## 2013-03-02 MED ORDER — LACTATED RINGERS IV SOLN
INTRAVENOUS | Status: DC
  Filled 2013-03-02: qty 1000

## 2013-03-02 SURGICAL SUPPLY — 43 items
ADH SKN CLS APL DERMABOND .7 (GAUZE/BANDAGES/DRESSINGS) ×3
BANDAGE GAUZE ELAST BULKY 4 IN (GAUZE/BANDAGES/DRESSINGS) ×4 IMPLANT
BINDER BREAST LRG (GAUZE/BANDAGES/DRESSINGS) IMPLANT
BINDER BREAST MEDIUM (GAUZE/BANDAGES/DRESSINGS) ×1 IMPLANT
BLADE HEX COATED 2.75 (ELECTRODE) ×2 IMPLANT
BLADE SURG 15 STRL LF DISP TIS (BLADE) ×1 IMPLANT
BLADE SURG 15 STRL SS (BLADE) ×4
CANISTER SUCTION 2500CC (MISCELLANEOUS) ×2 IMPLANT
CHLORAPREP W/TINT 26ML (MISCELLANEOUS) ×2 IMPLANT
CLOTH BEACON ORANGE TIMEOUT ST (SAFETY) ×2 IMPLANT
COVER MAYO STAND STRL (DRAPES) ×2 IMPLANT
COVER TABLE BACK 60X90 (DRAPES) ×2 IMPLANT
DERMABOND ADVANCED (GAUZE/BANDAGES/DRESSINGS) ×3
DERMABOND ADVANCED .7 DNX12 (GAUZE/BANDAGES/DRESSINGS) ×1 IMPLANT
DRAPE LAPAROSCOPIC ABDOMINAL (DRAPES) ×2 IMPLANT
DRSG PAD ABDOMINAL 8X10 ST (GAUZE/BANDAGES/DRESSINGS) ×3 IMPLANT
ELECT REM PT RETURN 9FT ADLT (ELECTROSURGICAL) ×2
ELECTRODE REM PT RTRN 9FT ADLT (ELECTROSURGICAL) ×1 IMPLANT
GLOVE BIO SURGEON STRL SZ 6.5 (GLOVE) ×6 IMPLANT
GLOVE ECLIPSE 6.5 STRL STRAW (GLOVE) ×1 IMPLANT
GLOVE INDICATOR 7.0 STRL GRN (GLOVE) ×1 IMPLANT
GOWN PREVENTION PLUS XLARGE (GOWN DISPOSABLE) ×4 IMPLANT
IMPL GEL HP 590CC (Breast) IMPLANT
IMPLANT GEL HP 590CC (Breast) ×4 IMPLANT
MENTOR RESTERILIZABLE GEL SIZER 535CC ×1 IMPLANT
NDL HYPO 25X1 1.5 SAFETY (NEEDLE) IMPLANT
NEEDLE HYPO 25X1 1.5 SAFETY (NEEDLE) ×2 IMPLANT
PACK BASIN DAY SURGERY FS (CUSTOM PROCEDURE TRAY) ×2 IMPLANT
PENCIL BUTTON HOLSTER BLD 10FT (ELECTRODE) ×2 IMPLANT
SLEEVE SCD COMPRESS KNEE MED (MISCELLANEOUS) ×2 IMPLANT
SPONGE LAP 18X18 X RAY DECT (DISPOSABLE) ×5 IMPLANT
SUT MNCRL AB 4-0 PS2 18 (SUTURE) ×2 IMPLANT
SUT MON AB 5-0 PS2 18 (SUTURE) ×3 IMPLANT
SUT VIC AB 3-0 SH 27 (SUTURE) ×12
SUT VIC AB 3-0 SH 27X BRD (SUTURE) ×1 IMPLANT
SUT VIC AB 5-0 PS2 18 (SUTURE) ×2 IMPLANT
SUT VICRYL 4-0 PS2 18IN ABS (SUTURE) ×3 IMPLANT
SYR BULB IRRIGATION 50ML (SYRINGE) ×2 IMPLANT
SYR CONTROL 10ML LL (SYRINGE) ×1 IMPLANT
TOWEL OR 17X24 6PK STRL BLUE (TOWEL DISPOSABLE) ×5 IMPLANT
TUBE CONNECTING 12X1/4 (SUCTIONS) ×2 IMPLANT
UNDERPAD 30X30 INCONTINENT (UNDERPADS AND DIAPERS) ×4 IMPLANT
YANKAUER SUCT BULB TIP NO VENT (SUCTIONS) ×2 IMPLANT

## 2013-03-02 NOTE — Brief Op Note (Signed)
03/02/2013  10:34 AM  PATIENT:  Betty [name]  37 y.o. female  PRE-OPERATIVE DIAGNOSIS:  HISTORY BILATERAL BREAST CANCER  POST-OPERATIVE DIAGNOSIS:  HISTORY BILATERAL BREAST CANCER  PROCEDURE:  Procedure(s): REMOVAL OF BILATERAL TISSUE EXPANDER WITH PLACEMENT OF BILATERAL IMPLANTS   (Bilateral)  SURGEON:  Surgeon(s) and Role:    * Marian Meneely Sanger, DO - Primary  PHYSICIAN ASSISTANT: Shawn Rayburn, PA  ASSISTANTS: none   ANESTHESIA:   general  EBL:  Total I/O In: 1000 [I.V.:1000] Out: -   BLOOD ADMINISTERED:none  DRAINS: none   LOCAL MEDICATIONS USED:  marcaine  SPECIMEN:  Source of Specimen:  mastectomy scars  DISPOSITION OF SPECIMEN:  PATHOLOGY  COUNTS:  YES  TOURNIQUET:  * No tourniquets in log *  DICTATION: .Dragon Dictation  PLAN OF CARE: Discharge to home after PACU  PATIENT DISPOSITION:  PACU - hemodynamically stable.   Delay start of Pharmacological VTE agent (>24hrs) due to surgical blood loss or risk of bleeding: no

## 2013-03-02 NOTE — Transfer of Care (Signed)
Immediate Anesthesia Transfer of Care Note Immediate Anesthesia Transfer of Care Note  Patient: Betty [name]  Procedure(s) Performed: Procedure(s) (LRB): REMOVAL OF BILATERAL TISSUE EXPANDER WITH PLACEMENT OF BILATERAL IMPLANTS   (Bilateral)  Patient Location: Patient transported to PACU with oxygen via face mask at 4 Liters / Min  Anesthesia Type: General  Level of Consciousness: awake and alert   Airway & Oxygen Therapy: Patient Spontanous Breathing and Patient connected to face mask oxygen  Post-op Assessment: Report given to PACU RN and Post -op Vital signs reviewed and stable  Post vital signs: Reviewed and stable  Dentition: Teeth and oropharynx remain in pre-op condition  Complications: No apparent anesthesia complications

## 2013-03-02 NOTE — Interval H&P Note (Signed)
History and Physical Interval Note:  03/02/2013 7:47 AM  Betty [name]  has presented today for surgery, with the diagnosis of HISTORY BILATERAL BREAST CANCER  The various methods of treatment have been discussed with the patient and family. After consideration of risks, benefits and other options for treatment, the patient has consented to  Procedure(s): REMOVAL OF BILATERAL TISSUE EXPANDER WITH PLACEMENT OF BILATERAL IMPLANTS   (Bilateral) as a surgical intervention .  The patient's history has been reviewed, patient examined, no change in status, stable for surgery.  I have reviewed the patient's chart and labs.  Questions were answered to the patient's satisfaction.     Betty [name]

## 2013-03-02 NOTE — Op Note (Signed)
Op report Bilateral Exchange   SURGICAL DIVISION: Plastic Surgery  PREOPERATIVE DIAGNOSES:  1. History of breast cancer.  2. Acquired absence of bilateral breast.   POSTOPERATIVE DIAGNOSES:  1. History of breast cancer.  2. Acquired absence of bilateral breast.   PROCEDURE:  1. Bilateral exchange of tissue expanders for implants.  2. Bilateral capsulotomies for implant respositioning.  SURGEON: Wayland Denis, DO  ASSISTANT: Shawn Rayburn, PA  ANESTHESIA:  General.   COMPLICATIONS: None.   IMPLANTS: Left - Mentor Smooth Round High Profile Gel 590cc. Ref #[id number].  Serial Number [id number] Right - Mentor Smooth Round High Profile Gel 590cc. Ref #[id number].  Serial Number [id number]  INDICATIONS FOR PROCEDURE:  The patient has a history of bilateral mastectomies and had tissue expanders placed at the time of mastectomies. She now presents for exchange of her expanders for implants.  She requires capsulotomies to better position the implants.   CONSENT:  Informed consent was obtained directly from the patient. Risks, benefits and alternatives were fully discussed. Specific risks including but not limited to bleeding, infection, hematoma, seroma, scarring, pain, implant infection, implant extrusion, capsular contracture, asymmetry, wound healing problems, and need for further surgery were all discussed. The patient did have an ample opportunity to have her questions answered to her satisfaction.   DESCRIPTION OF PROCEDURE:  The patient was taken to the operating room. SCDs were placed and IV antibiotics were given. The patient's chest was prepped and draped in a sterile fashion. A time out was performed and the implants to be used were identified.  One percent Xylocaine with epinephrine was used to infiltrate the area.   The old mastectomy scar was opened and the skin sent to pathology.  A small area of the superior mastectomy and inferior mastectomy flaps were re-raised over  the pectoralis major muscle. The pectoralis was split to expose the tissue expander which was removed. Inspection of the pocket showed a normal healthy capsule and good integration of the biologic matrix.   Medial, superior and inferior capsulotomies were performed on each breast to allow for breast pocket expansion on either side.  Measurements were made on either side to confirm adequate pocket size for the implant dimensions.  Hemostasis was ensured.  Gloves were changed. Sizers were used and the implant size was selected to be 590 cc. The implants were placed in the pockets and oriented appropriately. The pectoralis major muscle and capsule on the anterior surface were re-closed with a 3-0 Vicryl interrupted sutures followed by 4-0 Vicryl running suture. The remaining skin was closed with a running 5-0 Monocryl subcuticular stitch.  Dermabond was applied followed by ABDs and a breast binder.  The patient was extubated and allowed to wake up from anesthesia and taken to the recovery room in satisfactory condition.

## 2013-03-02 NOTE — H&P (Signed)
This document contains confidential information from a [facility] medical record system and may be unauthenticated. Release may be made only with a valid authorization or in accordance with applicable policies of Medical Center or its affiliates. This document must be maintained in a secure manner or discarded/destroyed as required by Medical Center policy or by a confidential means such as shredding.   Betty [name]  02/11/2013 8:30 AM   Office Visit  MRN:  [id number]  Department: [facility]  Dept Phone: [phone]  Description: Female DOB: [birth year]  Provider: Wayland Denis, DO    Diagnoses    Acquired absence of bilateral breasts and nipples       V45.71    Breast cancer, BRCA1 positive, right       174.9,  V84.01   Vitals - Last Recorded    116/75  62  1.778 m (5\' 10" )  77.111 kg (170 lb)  24.39 kg/m2        Subjective:   Patient ID: Betty [name] is a 37 y.o. female.  HPI The patient is a 37 yrs old wf here for history and physical for secondary implant exchange after a bilateral mastectomy with immediate reconstruction with expanders and ADM. Hsitory:  She felt a lump on her left breast and underwent a mammagram, ultrasound, biopsy and MRI. 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 She is currently undergoing neoadjuvant chemotherapy (FEC with Neulasta, Taxotere with Neulasta support). The chemo finished the end of August. She is BRCA1 positive. Her bra size is 36 A and she would like to be around a large B to C. She is 5 feet 10 inches tall and weighs 172 pounds. She breast feed her 69 yrs old son for a few months. No radiation is planned. Her sister is also BRCA positive. She has been doing well at home.  The expander is 350 mL mentor medium height with 550 cc in each.  The following portions of the patient's history were reviewed and updated as appropriate: allergies, current medications, past family history, past medical history, past  social history, past surgical history and problem list.  Review of Systems  Constitutional: Negative.   HENT: Negative.   Eyes: Negative.   Respiratory: Negative.   Cardiovascular: Negative.   Gastrointestinal: Negative.   Endocrine: Negative.   Genitourinary: Negative.   Allergic/Immunologic: Negative.   Neurological: Negative.   Hematological: Negative.   Psychiatric/Behavioral: Negative.       Objective:    Physical Exam  Constitutional: She is oriented to person, place, and time. She appears well-developed and well-nourished. No distress.  HENT:   Head: Normocephalic and atraumatic.   Nose: Nose normal.   Mouth/Throat: Oropharynx is clear and moist.  Eyes: Conjunctivae and EOM are normal. Pupils are equal, round, and reactive to light.  Neck: Normal range of motion. Neck supple. No JVD present. No tracheal deviation present. No thyromegaly present.  Cardiovascular: Normal rate, regular rhythm, normal heart sounds and intact distal pulses.  Exam reveals no gallop and no friction rub.    No murmur heard. Pulmonary/Chest: Effort normal and breath sounds normal. No stridor. No respiratory distress. She has no wheezes. She has no rales. She exhibits no tenderness.  Bilateral breast incisions are well healed and flaps remain viable  Abdominal: Soft. Bowel sounds are normal. She exhibits no distension and no mass. There is no tenderness.  Musculoskeletal: Normal range of motion. She exhibits no edema and no tenderness.  Lymphadenopathy:    She  has no cervical adenopathy.  Neurological: She is alert and oriented to person, place, and time. No cranial nerve deficit. She exhibits normal muscle tone. Coordination normal.  Skin: Skin is warm and dry. No rash noted. No erythema. No pallor.  Psychiatric: She has a normal mood and affect. Her behavior is normal. Judgment and thought content normal.      Assessment:   1.  Acquired absence of bilateral breasts and nipples    2.  Breast  cancer, BRCA1 positive, right        Plan:   The procedure, possible risks, benefits and complications were discussed with the patient and she desires to proceed and consent was obtained. We will remove the expanders and place implants with possible lipofilling for contour symmetry.    Medications Ordered This Encounter     HYDROcodone-acetaminophen (NORCO) 5-325 mg per tablet Take 1 tablet by mouth every 6 (six) hours as needed for 10 days for Pain.    diazepam (VALIUM) 2 MG tablet 30 tablet Take 1 tablet (2 mg total) by mouth every 6 (six) hours as needed for 10 days for Anxiety.     cephalexin (KEFLEX) 500 MG capsule Take 1 capsule (500 mg total) by mouth 4 times daily.    promethazine (PHENERGAN) 12.5 MG tablet 30 Take 1 tablet (12.5 mg total) by mouth every 6 (six) hours as needed for 7 days for Nausea.

## 2013-03-02 NOTE — Anesthesia Procedure Notes (Signed)
Procedure Name: LMA Insertion Date/Time: 03/02/2013 8:33 AM Performed by: Fran Lowes Pre-anesthesia Checklist: Patient identified, Emergency Drugs available, Suction available and Patient being monitored Patient Re-evaluated:Patient Re-evaluated prior to inductionOxygen Delivery Method: Circle System Utilized Preoxygenation: Pre-oxygenation with 100% oxygen Intubation Type: IV induction Ventilation: Mask ventilation without difficulty LMA: LMA inserted LMA Size: 4.0 Number of attempts: 1 Airway Equipment and Method: bite block Placement Confirmation: positive ETCO2 Tube secured with: Tape Dental Injury: Teeth and Oropharynx as per pre-operative assessment

## 2013-03-02 NOTE — H&P (View-Only) (Signed)
This document contains confidential information from a [facility] medical record system and may be unauthenticated. Release may be made only with a valid authorization or in accordance with applicable policies of Medical Center or its affiliates. This document must be maintained in a secure manner or discarded/destroyed as required by Medical Center policy or by a confidential means such as shredding.   Betty [name]  02/11/2013 8:30 AM   Office Visit  MRN:  [id number]  Department: [facility]  Dept Phone: [phone]  Description: Female DOB: 06/06/1976  Provider: [name], DO    Diagnoses    Acquired absence of bilateral breasts and nipples       V45.71    Breast cancer, BRCA1 positive, right       174.9,  V84.01   Vitals - Last Recorded    116/75  62  1.778 m (5' 10")  77.111 kg (170 lb)  24.39 kg/m2        Subjective:   Patient ID: Betty [name] is a 37 y.o. female.  HPI The patient is a 37 yrs old wf here for history and physical for secondary implant exchange after a bilateral mastectomy with immediate reconstruction with expanders and ADM. Hsitory:  She felt a lump on her left breast and underwent a mammagram, ultrasound, biopsy and MRI. 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 She is currently undergoing neoadjuvant chemotherapy (FEC with Neulasta, Taxotere with Neulasta support). The chemo finished the end of August. She is BRCA1 positive. Her bra size is 36 A and she would like to be around a large B to C. She is 5 feet 10 inches tall and weighs 172 pounds. She breast feed her 6 yrs old son for a few months. No radiation is planned. Her sister is also BRCA positive. She has been doing well at home.  The expander is 350 mL mentor medium height with 550 cc in each.  The following portions of the patient's history were reviewed and updated as appropriate: allergies, current medications, past family history, past medical history, past  social history, past surgical history and problem list.  Review of Systems  Constitutional: Negative.   HENT: Negative.   Eyes: Negative.   Respiratory: Negative.   Cardiovascular: Negative.   Gastrointestinal: Negative.   Endocrine: Negative.   Genitourinary: Negative.   Allergic/Immunologic: Negative.   Neurological: Negative.   Hematological: Negative.   Psychiatric/Behavioral: Negative.       Objective:    Physical Exam  Constitutional: She is oriented to person, place, and time. She appears well-developed and well-nourished. No distress.  HENT:   Head: Normocephalic and atraumatic.   Nose: Nose normal.   Mouth/Throat: Oropharynx is clear and moist.  Eyes: Conjunctivae and EOM are normal. Pupils are equal, round, and reactive to light.  Neck: Normal range of motion. Neck supple. No JVD present. No tracheal deviation present. No thyromegaly present.  Cardiovascular: Normal rate, regular rhythm, normal heart sounds and intact distal pulses.  Exam reveals no gallop and no friction rub.    No murmur heard. Pulmonary/Chest: Effort normal and breath sounds normal. No stridor. No respiratory distress. She has no wheezes. She has no rales. She exhibits no tenderness.  Bilateral breast incisions are well healed and flaps remain viable  Abdominal: Soft. Bowel sounds are normal. She exhibits no distension and no mass. There is no tenderness.  Musculoskeletal: Normal range of motion. She exhibits no edema and no tenderness.  Lymphadenopathy:    She   has no cervical adenopathy.  Neurological: She is alert and oriented to person, place, and time. No cranial nerve deficit. She exhibits normal muscle tone. Coordination normal.  Skin: Skin is warm and dry. No rash noted. No erythema. No pallor.  Psychiatric: She has a normal mood and affect. Her behavior is normal. Judgment and thought content normal.      Assessment:   1.  Acquired absence of bilateral breasts and nipples    2.  Breast  cancer, BRCA1 positive, right        Plan:   The procedure, possible risks, benefits and complications were discussed with the patient and she desires to proceed and consent was obtained. We will remove the expanders and place implants with possible lipofilling for contour symmetry.    Medications Ordered This Encounter     HYDROcodone-acetaminophen (NORCO) 5-325 mg per tablet Take 1 tablet by mouth every 6 (six) hours as needed for 10 days for Pain.    diazepam (VALIUM) 2 MG tablet 30 tablet Take 1 tablet (2 mg total) by mouth every 6 (six) hours as needed for 10 days for Anxiety.     cephalexin (KEFLEX) 500 MG capsule Take 1 capsule (500 mg total) by mouth 4 times daily.    promethazine (PHENERGAN) 12.5 MG tablet 30 Take 1 tablet (12.5 mg total) by mouth every 6 (six) hours as needed for 7 days for Nausea.     

## 2013-03-02 NOTE — Anesthesia Preprocedure Evaluation (Addendum)
Anesthesia Evaluation  Patient identified by MRN, date of birth, ID band Patient awake    Reviewed: Allergy & Precautions, H&P , NPO status , Patient's Chart, lab work & pertinent test results  History of Anesthesia Complications Negative for: history of anesthetic complications  Airway Mallampati: I TM Distance: >3 FB Neck ROM: Full    Dental  (+) Teeth Intact and Dental Advisory Given   Pulmonary neg pulmonary ROS,  breath sounds clear to auscultation  Pulmonary exam normal       Cardiovascular negative cardio ROS  Rhythm:Regular Rate:Normal     Neuro/Psych negative neurological ROS  negative psych ROS   GI/Hepatic negative GI ROS, Neg liver ROS,   Endo/Other  negative endocrine ROS  Renal/GU negative Renal ROS  negative genitourinary   Musculoskeletal negative musculoskeletal ROS (+)   Abdominal   Peds  Hematology negative hematology ROS (+)   Anesthesia Other Findings   Reproductive/Obstetrics negative OB ROS                           Anesthesia Physical Anesthesia Plan  ASA: II  Anesthesia Plan: General   Post-op Pain Management:    Induction: Intravenous  Airway Management Planned: LMA  Additional Equipment:   Intra-op Plan:   Post-operative Plan: Extubation in OR  Informed Consent: I have reviewed the patients History and Physical, chart, labs and discussed the procedure including the risks, benefits and alternatives for the proposed anesthesia with the patient or authorized representative who has indicated his/her understanding and acceptance.   Dental advisory given  Plan Discussed with: CRNA  Anesthesia Plan Comments:         Anesthesia Quick Evaluation

## 2013-03-03 ENCOUNTER — Encounter (HOSPITAL_BASED_OUTPATIENT_CLINIC_OR_DEPARTMENT_OTHER): Admission: RE | Payer: Self-pay | Source: Ambulatory Visit

## 2013-03-03 ENCOUNTER — Ambulatory Visit (HOSPITAL_BASED_OUTPATIENT_CLINIC_OR_DEPARTMENT_OTHER): Admission: RE | Admit: 2013-03-03 | Payer: BC Managed Care – PPO | Source: Ambulatory Visit | Admitting: Plastic Surgery

## 2013-03-03 SURGERY — REMOVAL, TISSUE EXPANDER
Anesthesia: General | Site: Breast | Laterality: Bilateral

## 2013-03-03 NOTE — Anesthesia Postprocedure Evaluation (Signed)
Anesthesia Post Note  Patient: Betty [name]  Procedure(s) Performed: Procedure(s) (LRB): REMOVAL OF BILATERAL TISSUE EXPANDER WITH PLACEMENT OF BILATERAL IMPLANTS   (Bilateral)  Anesthesia type: General  Patient location: PACU  Post pain: Pain level controlled  Post assessment: Post-op Vital signs reviewed  Last Vitals:  Filed Vitals:   03/02/13 1215  BP: 132/79  Pulse: 67  Temp: 35.8 C  Resp: 18    Post vital signs: Reviewed  Level of consciousness: sedated  Complications: No apparent anesthesia complications

## 2013-05-24 ENCOUNTER — Telehealth: Payer: Self-pay | Admitting: *Deleted

## 2013-05-24 NOTE — Telephone Encounter (Signed)
I called patient to check in and remind her of her appointment with Dr. Magnus Ivan on 05/30/13.  I left my contact information for patient to call me with any questions or concerns.

## 2013-05-30 ENCOUNTER — Ambulatory Visit (INDEPENDENT_AMBULATORY_CARE_PROVIDER_SITE_OTHER): Payer: BC Managed Care – PPO | Admitting: Surgery

## 2013-05-30 ENCOUNTER — Encounter (INDEPENDENT_AMBULATORY_CARE_PROVIDER_SITE_OTHER): Payer: Self-pay | Admitting: Surgery

## 2013-05-30 VITALS — BP 125/88 | HR 68 | Temp 98.1°F | Resp 14 | Ht 70.0 in | Wt 177.8 lb

## 2013-05-30 DIAGNOSIS — Z853 Personal history of malignant neoplasm of breast: Secondary | ICD-10-CM

## 2013-05-30 NOTE — Progress Notes (Signed)
Subjective:     Patient ID: Betty [name], female   DOB: [birth year], 37 y.o.   MRN: [id number]  HPI She is here for long-term followup of her breast cancer status post bilateral mastectomies and immediate reconstruction. She has now had her final implants placed and is doing very well without complaints  Review of Systems     Objective:   Physical Exam On exam, her incisions are healing well and there is no palpable masses or axillary adenopathy on either side    Assessment:     Long-term followup breast cancer    Plan:     She will continue to be followed at the cancer Center. I will see her back in 6 months

## 2013-06-20 ENCOUNTER — Telehealth: Payer: Self-pay | Admitting: Oncology

## 2013-06-20 ENCOUNTER — Other Ambulatory Visit (HOSPITAL_BASED_OUTPATIENT_CLINIC_OR_DEPARTMENT_OTHER): Payer: BC Managed Care – PPO | Admitting: Lab

## 2013-06-20 ENCOUNTER — Ambulatory Visit (HOSPITAL_BASED_OUTPATIENT_CLINIC_OR_DEPARTMENT_OTHER): Payer: BC Managed Care – PPO | Admitting: Oncology

## 2013-06-20 ENCOUNTER — Encounter: Payer: Self-pay | Admitting: *Deleted

## 2013-06-20 VITALS — BP 122/78 | HR 54 | Temp 97.8°F | Resp 20 | Ht 70.0 in | Wt 178.3 lb

## 2013-06-20 DIAGNOSIS — Z171 Estrogen receptor negative status [ER-]: Secondary | ICD-10-CM

## 2013-06-20 DIAGNOSIS — C50519 Malignant neoplasm of lower-outer quadrant of unspecified female breast: Secondary | ICD-10-CM

## 2013-06-20 DIAGNOSIS — Z8041 Family history of malignant neoplasm of ovary: Secondary | ICD-10-CM

## 2013-06-20 LAB — CBC WITH DIFFERENTIAL/PLATELET
BASO%: 0.4 % (ref 0.0–2.0)
EOS%: 4.7 % (ref 0.0–7.0)
HCT: 39.2 % (ref 34.8–46.6)
LYMPH%: 29.2 % (ref 14.0–49.7)
MCH: 30.2 pg (ref 25.1–34.0)
MCHC: 34.2 g/dL (ref 31.5–36.0)
NEUT%: 59.1 % (ref 38.4–76.8)
Platelets: 234 10*3/uL (ref 145–400)
RBC: 4.43 10*6/uL (ref 3.70–5.45)
lymph#: 1.6 10*3/uL (ref 0.9–3.3)

## 2013-06-20 LAB — COMPREHENSIVE METABOLIC PANEL (CC13)
ALT: 12 U/L (ref 0–55)
AST: 18 U/L (ref 5–34)
Creatinine: 0.8 mg/dL (ref 0.6–1.1)
Total Bilirubin: 0.41 mg/dL (ref 0.20–1.20)

## 2013-06-20 NOTE — Telephone Encounter (Signed)
, °

## 2013-06-20 NOTE — Progress Notes (Signed)
Patient at [facility] for office visit with Dr. Darnelle Catalan.  I met with patient following her visit to introduce myself in person as the [employer].  I gave patient my contact information.  Patient reports that she is doing well.  She denies any questions or concerns at this time.  I encouraged her to call me for any needs she may encounter.  Patient verbalized understanding.

## 2013-06-20 NOTE — Progress Notes (Signed)
ID: Betty [name]   DOB: [birth year]  MR#: [id number]  WUJ#:[id number]  PCP: Ailene Ravel, MD GYN: Dr. Marcelle Overlie SU: Dr. Barrie Dunker OTHER MD:  Allergist: Dr. Maple Hudson  HISTORY OF PRESENT ILLNESS: Betty [name] is a 37 year old woman from [facility], BRCA 1 positive, who presented with a painful, palpable mass in her right breast that she first noticed in January 2013. A bilateral mammogram and right breast ultrasound performed on 02/20/2012 showed an irregular hypoechoic mass at 8 o clock position measuring 2.1 x 1.1 x 1.6 cm.  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  MRI scan performed on 02/27/2012 showed this mass to be 2.2 x 1.6 x 1.9 cm. No other masses were seen.  She underwent 4 cycles of FEC treatment from 03/15/12 to 04/27/12 followed by 4 cycles of Taxotere from 05/11/12 to 06/22/12.  She underwent bilateral simple mastectomies with right axillary sentinel lymph node biopsy by Dr. Magnus Ivan on 08/05/12 with complete pathological response.    INTERVAL HISTORY:  Betty [name] returns today for followup of her BRCA positive breast cancer. Interval history is unremarkable. Her periods resumed in May of this year and are now again regular.  REVIEW OF SYSTEMS: She has had no unusual headaches, visual changes, cough, phlegm production, pleurisy, change in bowel or bladder habits, fever, rash, bleeding, or unexplained weight loss or fatigue. There is no pain. She has minimal heartburn and minimal seasonal allergy symptoms. A detailed review of systems today was otherwise noncontributory.  PAST MEDICAL HISTORY: Past Medical History  Diagnosis Date  . Allergic rhinitis   . Breast cancer, BRCA1 positive RIGHT INVASIVE DUCTAL CANCER---  S/P BILATERAL MASTECTOMY W/ RECONSTRUCTION  08-05-2012--  CHEMO ENEDED 06-22-2012    ONCOLOGIST- DR Darnelle Catalan    PAST SURGICAL HISTORY: Past Surgical  History  Procedure Laterality Date  . Inguinal hernia repair  AS INFANT  . Shoulder surgery  1996  &  1999    - labrum and capsular repair  . Portacath placement  03/10/2012    Procedure: INSERTION PORT-A-CATH;  Surgeon: Shelly Rubenstein, MD;  Location: [facility];  Service: General;  Laterality: N/A;  . Mastectomy w/ sentinel node biopsy  08/05/2012    Procedure: MASTECTOMY WITH SENTINEL LYMPH NODE BIOPSY;  Surgeon: Shelly Rubenstein, MD;  Location: [facility];  Service: General;  Laterality: Right;   AND LEFT MASTECTOMY  . Port-a-cath removal  08/05/2012    Procedure: REMOVAL PORT-A-CATH;  Surgeon: Shelly Rubenstein, MD;  Location: [facility];  Service: General;  Laterality: N/A;  . Tissue expander placement  08/05/2012    Procedure: TISSUE EXPANDER;  Surgeon: Wayland Denis, DO;  Location: [facility];  Service: Plastics;  Laterality: Bilateral;  With Expanders and Flex HD Placement   . Breast reconstruction  08/05/2012    Procedure: BREAST RECONSTRUCTION;  Surgeon: Wayland Denis, DO;  Location: [facility];  Service: Plastics;  Laterality: Bilateral;  . Transthoracic echocardiogram  03-08-2012    NORMAL LVSF/ EF 60-65%    FAMILY HISTORY Family History  Problem Relation Age of Onset  . Allergic rhinitis      grandmother  . Ovarian cancer Mother 64  . Cancer Mother     ovarian  . Cancer Paternal Uncle     leukemia  . Cancer Paternal Uncle     lung cancer    GYNECOLOGIC HISTORY:  Periods interrupted with chemotherapy, resumed may 2014,  initially have a, and now regular and not heavy.  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 Divorced, has one son, Betty [name], currently 38 y/o.  HEALTH MAINTENANCE: History  Substance Use Topics  . Smoking status: Never Smoker   . Smokeless tobacco: Never Used  . Alcohol Use: Yes     Comment: occas    PAP:  2013 with Dr. Marcelle Overlie  Bone density: N/A  Lipid panel:  Manage by Dr.  Nathanial Rancher  Allergies  Allergen Reactions  . Dextromethorphan Rash    Current Outpatient Prescriptions  Medication Sig Dispense Refill  . Chlorpheniramine-PSE-Ibuprofen (ADVIL ALLERGY SINUS PO) Take 1 tablet by mouth as needed. For allergies      . EPINEPHrine (EPIPEN) 0.3 mg/0.3 mL DEVI Inject 0.3 mg into the muscle once.       . Multiple Vitamin (MULTIVITAMIN WITH MINERALS) TABS Take 1 tablet by mouth daily.       No current facility-administered medications for this visit.    OBJECTIVE: Young white female who appears well Filed Vitals:   06/20/13 1027  BP: 122/78  Pulse: 54  Temp: 97.8 F (36.6 C)  Resp: 20     Body mass index is 25.58 kg/(m^2).    ECOG FS: 0 Sclerae unicteric Oropharynx clear No cervical or supraclavicular adenopathy Lungs no rales or rhonchi Heart regular rate and rhythm Abd benign MSK no focal spinal tenderness, no peripheral edema Neuro: nonfocal, well oriented, positive affect Breasts: Status post bilateral mastectomies with implant reconstruction. There is no evidence of local recurrence. Both axillae are benign.    LAB RESULTS: Lab Results  Component Value Date   WBC 5.4 06/20/2013   NEUTROABS 3.2 06/20/2013   HGB 13.4 06/20/2013   HCT 39.2 06/20/2013   MCV 88.5 06/20/2013   PLT 234 06/20/2013      Chemistry      Component Value Date/Time   NA 140 12/20/2012 1006   NA 136 09/01/2012 1641   K 3.9 12/20/2012 1006   K 3.9 09/01/2012 1641   CL 104 12/20/2012 1006   CL 99 09/01/2012 1641   CO2 25 12/20/2012 1006   CO2 23 09/01/2012 1641   BUN 14.4 12/20/2012 1006   BUN 12 09/01/2012 1641   CREATININE 0.8 12/20/2012 1006   CREATININE 0.66 09/01/2012 1641      Component Value Date/Time   CALCIUM 10.1 12/20/2012 1006   CALCIUM 9.7 09/01/2012 1641   ALKPHOS 94 12/20/2012 1006   ALKPHOS 104 09/01/2012 1641   AST 21 12/20/2012 1006   AST 23 09/01/2012 1641   ALT 18 12/20/2012 1006   ALT 13 09/01/2012 1641   BILITOT 0.33 12/20/2012 1006   BILITOT  0.1* 09/01/2012 1641       Lab Results  Component Value Date   LABCA2 25 12/20/2012    No components found with this basename: [id number]    No results found for this basename: INR,  in the last 168 hours  Urinalysis No results found for this basename: colorurine,  appearanceur,  labspec,  phurine,  glucoseu,  hgbur,  bilirubinur,  ketonesur,  proteinur,  urobilinogen,  nitrite,  leukocytesur    STUDIES: No results found.   ASSESSMENT: 37 y.o. BRCA1 positive [IVS5-11T>G] [place] woman:  #1  S/p biopsy of the right breast on 02/20/12 showing a clinical T2, N0, M0, stage IIA  Invasive ductal carcinoma, grade 3, triple negative, with a Ki67 of  98%  #2  Completed 4 cycles of fluorouracil, epirubicin and cyclophosphamide from 03/15/12 to 04/27/12 followed by 4 cycles of docetaxel from 05/11/12 to 06/22/12.  #3  Bilateral simple mastectomies with right axillary sentinel lymph node biopsy on 08/05/12 showed a complete pathological response.  PLAN: As far as her breast cancer is concerned, she has an excellent chance of cure because she achieved a complete pathologic remission with her chemotherapy. She understands she is at risk for or ovarian cancer, and that we have no proven way to screen for this. Many gynecologists do CA 125's and transvaginal ultrasounds at various intervals, for instance every 3 to 12 months, but again she understands there is a risk of ovarian cancer developing in the interval. Her plan is to keep her ovaries until she is 41 or 42 (40 is when her mother's ovarian cancer was diagnosed).   As far as whether she gets pregnant or not in the next few years, that in itself does not increase the risk of breast cancer.  We're going to continue to see her on a three-month basis alternating with her other physicians and since she already is scheduled to see Dr. Marcelle Overlie in January, she will return to see me in April. She knows to call for any problems that may develop before next  visit here Jarrett Chicoine C    06/20/2013

## 2013-06-28 ENCOUNTER — Telehealth: Payer: Self-pay | Admitting: Internal Medicine

## 2013-06-28 NOTE — Telephone Encounter (Deleted)
Noted  

## 2013-06-28 NOTE — Telephone Encounter (Signed)
FYI Pt. Never came in to pick up vac. Made 09/17/11.

## 2013-09-08 ENCOUNTER — Other Ambulatory Visit: Payer: Self-pay

## 2013-11-08 ENCOUNTER — Ambulatory Visit (INDEPENDENT_AMBULATORY_CARE_PROVIDER_SITE_OTHER): Payer: BC Managed Care – PPO | Admitting: Surgery

## 2013-11-08 ENCOUNTER — Encounter (INDEPENDENT_AMBULATORY_CARE_PROVIDER_SITE_OTHER): Payer: Self-pay | Admitting: Surgery

## 2013-11-08 VITALS — BP 120/61 | HR 70 | Temp 98.6°F | Resp 12 | Ht 70.0 in | Wt 177.0 lb

## 2013-11-08 DIAGNOSIS — Z853 Personal history of malignant neoplasm of breast: Secondary | ICD-10-CM

## 2013-11-08 NOTE — Progress Notes (Signed)
Subjective:     Patient ID: Betty [name], female   DOB: 04/11/1976, 38 y.o.   MRN: [id number]  HPI She is here for long-term followup of her right breast cancer. She is status post bilateral mastectomies and reconstruction as she was genetically positive. She again, had a complete response with her chemotherapy. She is doing well and has no complaints.  Review of Systems     Objective:   Physical Exam On exam, her incisions are well healed. There are no palpable masses in the breasts which were reconstructed and no axillary adenopathy    Assessment:     Patient stable with long-term history of right breast cancer     Plan:     She will continue to be followed at the cancer center. I will see her back in 6 months

## 2013-12-19 ENCOUNTER — Telehealth: Payer: Self-pay | Admitting: *Deleted

## 2013-12-19 NOTE — Telephone Encounter (Signed)
I called patient to check in.  Patient reports that she is doing well without any complaints.  She denies any questions or concerns at this time.  I encouraged her to call me for any needs she may have.

## 2014-02-13 ENCOUNTER — Other Ambulatory Visit (HOSPITAL_BASED_OUTPATIENT_CLINIC_OR_DEPARTMENT_OTHER): Payer: BC Managed Care – PPO

## 2014-02-13 ENCOUNTER — Ambulatory Visit (HOSPITAL_BASED_OUTPATIENT_CLINIC_OR_DEPARTMENT_OTHER): Payer: BC Managed Care – PPO | Admitting: Physician Assistant

## 2014-02-13 ENCOUNTER — Encounter: Payer: Self-pay | Admitting: Physician Assistant

## 2014-02-13 ENCOUNTER — Encounter: Payer: Self-pay | Admitting: *Deleted

## 2014-02-13 VITALS — BP 119/74 | HR 50 | Temp 98.8°F | Resp 18 | Ht 70.0 in | Wt 164.3 lb

## 2014-02-13 DIAGNOSIS — C50519 Malignant neoplasm of lower-outer quadrant of unspecified female breast: Secondary | ICD-10-CM

## 2014-02-13 DIAGNOSIS — Z1501 Genetic susceptibility to malignant neoplasm of breast: Secondary | ICD-10-CM

## 2014-02-13 DIAGNOSIS — Z1509 Genetic susceptibility to other malignant neoplasm: Secondary | ICD-10-CM

## 2014-02-13 DIAGNOSIS — Z171 Estrogen receptor negative status [ER-]: Secondary | ICD-10-CM

## 2014-02-13 LAB — COMPREHENSIVE METABOLIC PANEL (CC13)
ALBUMIN: 4.1 g/dL (ref 3.5–5.0)
ALK PHOS: 44 U/L (ref 40–150)
ALT: 9 U/L (ref 0–55)
ANION GAP: 6 meq/L (ref 3–11)
AST: 17 U/L (ref 5–34)
BILIRUBIN TOTAL: 0.43 mg/dL (ref 0.20–1.20)
BUN: 10.6 mg/dL (ref 7.0–26.0)
CO2: 24 meq/L (ref 22–29)
Calcium: 8.9 mg/dL (ref 8.4–10.4)
Chloride: 108 mEq/L (ref 98–109)
Creatinine: 0.8 mg/dL (ref 0.6–1.1)
GLUCOSE: 71 mg/dL (ref 70–140)
POTASSIUM: 3.7 meq/L (ref 3.5–5.1)
SODIUM: 138 meq/L (ref 136–145)
TOTAL PROTEIN: 6.7 g/dL (ref 6.4–8.3)

## 2014-02-13 LAB — CBC WITH DIFFERENTIAL/PLATELET
BASO%: 0.3 % (ref 0.0–2.0)
Basophils Absolute: 0 10*3/uL (ref 0.0–0.1)
EOS ABS: 0.2 10*3/uL (ref 0.0–0.5)
EOS%: 4.5 % (ref 0.0–7.0)
HCT: 41 % (ref 34.8–46.6)
HGB: 13.4 g/dL (ref 11.6–15.9)
LYMPH%: 30.6 % (ref 14.0–49.7)
MCH: 29.3 pg (ref 25.1–34.0)
MCHC: 32.7 g/dL (ref 31.5–36.0)
MCV: 89.7 fL (ref 79.5–101.0)
MONO#: 0.3 10*3/uL (ref 0.1–0.9)
MONO%: 6.7 % (ref 0.0–14.0)
NEUT%: 57.9 % (ref 38.4–76.8)
NEUTROS ABS: 2.8 10*3/uL (ref 1.5–6.5)
Platelets: 208 10*3/uL (ref 145–400)
RBC: 4.57 10*6/uL (ref 3.70–5.45)
RDW: 13.7 % (ref 11.2–14.5)
WBC: 4.8 10*3/uL (ref 3.9–10.3)
lymph#: 1.5 10*3/uL (ref 0.9–3.3)

## 2014-02-13 LAB — CA 125: CA 125: 11.5 U/mL (ref 0.0–30.2)

## 2014-02-13 NOTE — Progress Notes (Signed)
ID: Betty [name]   DOB: 1976/09/04  MR#: [id number]  SKA#:[id number]  PCP: Leonides Sake, MD GYN: Dr. Molli Posey SU: Dr. Coralie Keens OTHER MD:  Allergist: Dr. Annamaria Boots  CHIEF COMPLAINTS:  1)  Hx of Right  Breast Cancer      2) BRCA 1 Positive    HISTORY OF PRESENT ILLNESS: Betty [name] is a 38 year old [name] from [facility], BRCA 1 positive, who presented with a painful, palpable mass in her right breast that she first noticed in January 2013. A bilateral mammogram and right breast ultrasound performed on 02/20/2012 showed an irregular hypoechoic mass at 8 o clock position measuring 2.1 x 1.1 x 1.6 cm.  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  MRI scan performed on 02/27/2012 showed this mass to be 2.2 x 1.6 x 1.9 cm. No other masses were seen.  She underwent 4 cycles of FEC treatment from 03/15/12 to 04/27/12 followed by 4 cycles of Taxotere from 05/11/12 to 06/22/12.  She underwent bilateral simple mastectomies with right axillary sentinel lymph node biopsy by Dr. Ninfa Linden on 08/05/12 with complete pathological response.    Subsequent history is as detailed below.   INTERVAL HISTORY:   Betty [name] returns LN today for followup of her history of  BRCA 1 positive right breast cancer. She is doing well. She is exercising regularly, running on the treadmill in participating in some yoga classes. She staying busy with her 29-year-old son who was playing baseball.   Physically, Betty [name] has no new complaints and is feeling very well. She still having regular menstrual cycles, with her last menstrual period being 01/20/2014. She sees Dr. Matthew Saras on a every 6 month basis for pelvic exams and followup.   REVIEW OF SYSTEMS: Betty [name] denies any recent illnesses and has had no fevers, chills, night sweats, or hot flashes. She denies any abnormal bruising or bleeding. Her energy  level is good, as is her appetite. She's had no nausea, emesis, or change in bowel or bladder habits. She has some seasonal allergies but denies any significant cough. She's had no production, pleurisy, increased shortness of breath, chest pain, or palpitations. She denies any abnormal headaches, dizziness, or change in vision. She's had no unusual myalgias, arthralgias, bony pain, or peripheral swelling.  In fact a detailed review of systems is otherwise stable and noncontributory.    PAST MEDICAL HISTORY: Past Medical History  Diagnosis Date  . Allergic rhinitis   . Breast cancer, BRCA1 positive RIGHT INVASIVE DUCTAL CANCER---  S/P BILATERAL MASTECTOMY W/ RECONSTRUCTION  08-05-2012--  CHEMO ENEDED 06-22-2012    ONCOLOGIST- DR Jana Hakim    PAST SURGICAL HISTORY: Past Surgical History  Procedure Laterality Date  . Inguinal hernia repair  AS INFANT  . Shoulder surgery  1996  &  1999    - labrum and capsular repair  . Portacath placement  03/10/2012    Procedure: INSERTION PORT-A-CATH;  Surgeon: Harl Bowie, MD;  Location: [facility];  Service: General;  Laterality: N/A;  . Mastectomy w/ sentinel node biopsy  08/05/2012    Procedure: MASTECTOMY WITH SENTINEL LYMPH NODE BIOPSY;  Surgeon: Harl Bowie, MD;  Location: [place];  Service: General;  Laterality: Right;   AND LEFT MASTECTOMY  . Port-a-cath removal  08/05/2012    Procedure: REMOVAL PORT-A-CATH;  Surgeon: Harl Bowie, MD;  Location: [place];  Service: General;  Laterality: N/A;  .  Tissue expander placement  08/05/2012    Procedure: TISSUE EXPANDER;  Surgeon: Theodoro Kos, DO;  Location: [place];  Service: Plastics;  Laterality: Bilateral;  With Expanders and Flex HD Placement   . Breast reconstruction  08/05/2012    Procedure: BREAST RECONSTRUCTION;  Surgeon: Theodoro Kos, DO;  Location: [place];  Service: Plastics;  Laterality: Bilateral;  . Transthoracic echocardiogram  03-08-2012    NORMAL LVSF/ EF 60-65%    FAMILY  HISTORY Family History  Problem Relation Age of Onset  . Allergic rhinitis      grandmother  . Ovarian cancer Mother 55  . Cancer Mother     ovarian  . Cancer Paternal Uncle     leukemia  . Cancer Paternal Uncle     lung cancer    GYNECOLOGIC HISTORY:   (Updated 02/13/2014) Periods interrupted with chemotherapy, resumed May 2014, and normalized. LMP, 01/20/2014.   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 Divorced, has one son, Betty [name], currently 80 y/o.  HEALTH MAINTENANCE:  (Updated 02/13/2014) History  Substance Use Topics  . Smoking status: Never Smoker   . Smokeless tobacco: Never Used  . Alcohol Use: Yes     Comment: occas    PAP:  2013 with Dr. Matthew Saras  Bone density: Never  Lipid panel:  Not on file  Colonoscopy: Never  Allergies  Allergen Reactions  . Dextromethorphan Rash    Current Outpatient Prescriptions  Medication Sig Dispense Refill  . Chlorpheniramine-PSE-Ibuprofen (ADVIL ALLERGY SINUS PO) Take 1 tablet by mouth as needed. For allergies      . EPINEPHrine (EPIPEN) 0.3 mg/0.3 mL DEVI Inject 0.3 mg into the muscle once.       . Multiple Vitamin (MULTIVITAMIN WITH MINERALS) TABS Take 1 tablet by mouth daily.       No current facility-administered medications for this visit.    OBJECTIVE: Young white female who appears well and is in no acute distress Filed Vitals:   02/13/14 0924  BP: 119/74  Pulse: 50  Temp: 98.8 F (37.1 C)  Resp: 18     Body mass index is 23.57 kg/(m^2).    ECOG FS: 0 Filed Weights   02/13/14 0924  Weight: 164 lb 4.8 oz (74.526 kg)   Physical Exam: HEENT:  Sclerae anicteric.  Oropharynx clear, pink, and moist. Neck supple, trachea midline. No thyromegaly.  NODES:  No cervical or supraclavicular lymphadenopathy palpated.  BREAST EXAM:  Patient status post bilateral mastectomies with bilateral implant  reconstruction. Implants are intact. There no suspicious nodularities or skin changes, and no evidence of local recurrence on the right. Axillae are benign bilaterally, with no palpable lymphadenopathy. LUNGS:  Clear to auscultation bilaterally.  No crackles, wheezes, or rhonchi HEART:  Regular rate and rhythm. No murmur. ABDOMEN:  Soft, nontender.  Positive bowel sounds.  MSK:  No focal spinal tenderness to palpation. Good range of motion bilaterally in the upper extremities. EXTREMITIES:  No peripheral edema.  No lymphedema noted in the right upper extremity. SKIN:  Benign. No new skin lesions. No excessive ecchymoses. No petechiae. No pallor. NEURO:  Nonfocal. Well oriented.  Appropriate affect.     LAB RESULTS: Lab Results  Component Value Date   WBC 4.8 02/13/2014   NEUTROABS 2.8 02/13/2014   HGB 13.4 02/13/2014   HCT 41.0 02/13/2014   MCV 89.7 02/13/2014   PLT 208 02/13/2014  Chemistry      Component Value Date/Time   NA 138 02/13/2014 0833   NA 136 09/01/2012 1641   K 3.7 02/13/2014 0833   K 3.9 09/01/2012 1641   CL 104 12/20/2012 1006   CL 99 09/01/2012 1641   CO2 24 02/13/2014 0833   CO2 23 09/01/2012 1641   BUN 10.6 02/13/2014 0833   BUN 12 09/01/2012 1641   CREATININE 0.8 02/13/2014 0833   CREATININE 0.66 09/01/2012 1641      Component Value Date/Time   CALCIUM 8.9 02/13/2014 0833   CALCIUM 9.7 09/01/2012 1641   ALKPHOS 44 02/13/2014 0833   ALKPHOS 104 09/01/2012 1641   AST 17 02/13/2014 0833   AST 23 09/01/2012 1641   ALT 9 02/13/2014 0833   ALT 13 09/01/2012 1641   BILITOT 0.43 02/13/2014 0833   BILITOT 0.1* 09/01/2012 1641      CA125   Pending  02/13/2014   STUDIES: No results found.   ASSESSMENT: 38 y.o. BRCA1 positive [IVS5-11T>G] [place] [name]:  #1  S/p biopsy of the right breast on 02/20/12 showing a clinical T2, N0, M0, stage IIA  Invasive ductal carcinoma, grade 3, triple negative, with a Ki67 of 98%  #2  Completed 4 cycles of fluorouracil,  epirubicin and cyclophosphamide from 03/15/12 to 04/27/12 followed by 4 cycles of docetaxel from 05/11/12 to 06/22/12.  #3  Bilateral simple mastectomies with right axillary sentinel lymph node biopsy on 08/05/12 showed a complete pathological response.  PLAN:   Overall, Betty [name] is doing extremely well, and there is no clinical evidence of disease recurrence at this time. We will continue to follow her very closely on a every 6 month basis, and she will alternate these visits with q. six-month visits with Dr. Matthew Saras as well. We are following her labs here, including a CA 125.   At this time, Betty [name] is not in a relationship and has no active plans to have any additional children. Her original plan was to keep her ovaries until she is 39 or 4 (which is when her mother's ovarian cancer was diagnosed). She is rethinking this somewhat, thinking she might be ready to have her ovaries removed sooner rather than later, and asked for some information today regarding egg harvesting which we have provided for her.   She understands that if she does or does not get  Pregnant in the next few years, that in itself does not increase the risk of breast cancer.  Betty [name] will see Dr. Matthew Saras again in approximately July, and will return to see [place] in October for repeat labs and physical exam. We have reviewed this plan closely, and she voices understanding and agreement with this plan. Of course she also knows to call any time should she have any changes or problems.   Betty [name] Milda Smart PA-C    02/13/2014

## 2014-02-13 NOTE — [facility] Oncology Navigator Note (Signed)
Patient at [facility] for follow up appointment with Micah Flesher, PA.  Patient reports that she is doing well.  She denies any questions or concerns at this time.  I gave her my contact information and encouraged her to call me for any needs or assistance.

## 2014-02-16 ENCOUNTER — Telehealth: Payer: Self-pay | Admitting: Oncology

## 2014-02-16 NOTE — Telephone Encounter (Signed)
Called pt to give 6 month f/u appt ordered. LVM advising appts for lab on 08/21/14 @ 2.15p and md visit 08/28/14 @ 10.30am.

## 2014-05-29 ENCOUNTER — Ambulatory Visit (INDEPENDENT_AMBULATORY_CARE_PROVIDER_SITE_OTHER): Payer: BC Managed Care – PPO | Admitting: Surgery

## 2014-05-29 ENCOUNTER — Encounter (INDEPENDENT_AMBULATORY_CARE_PROVIDER_SITE_OTHER): Payer: Self-pay | Admitting: Surgery

## 2014-05-29 VITALS — BP 120/80 | HR 70 | Temp 97.7°F | Resp 16 | Ht 70.0 in | Wt 158.8 lb

## 2014-05-29 DIAGNOSIS — Z853 Personal history of malignant neoplasm of breast: Secondary | ICD-10-CM

## 2014-05-29 NOTE — Progress Notes (Signed)
Subjective:     Patient ID: Betty [name], female   DOB: [birth year], 39 y.o.   MRN: [id number]  HPI She is here for a six-month followup of her right breast cancer. She is now over 2 years out from finishing chemotherapy. She has had her permanent implants present bilaterally and is doing very well.  Review of Systems     Objective:   Physical Exam On exam, there are no palpable masses and no axillary adenopathy on her side    Assessment:     Patient stable with a long-term history of right breast cancer     Plan:     She will continue to be followed at the cancer center. I will see her back in one year

## 2014-08-18 ENCOUNTER — Other Ambulatory Visit: Payer: Self-pay | Admitting: *Deleted

## 2014-08-18 ENCOUNTER — Other Ambulatory Visit: Payer: Self-pay

## 2014-08-18 DIAGNOSIS — C50519 Malignant neoplasm of lower-outer quadrant of unspecified female breast: Secondary | ICD-10-CM

## 2014-08-21 ENCOUNTER — Other Ambulatory Visit (HOSPITAL_BASED_OUTPATIENT_CLINIC_OR_DEPARTMENT_OTHER): Payer: BC Managed Care – PPO

## 2014-08-21 DIAGNOSIS — C50519 Malignant neoplasm of lower-outer quadrant of unspecified female breast: Secondary | ICD-10-CM

## 2014-08-21 LAB — COMPREHENSIVE METABOLIC PANEL (CC13)
ALK PHOS: 54 U/L (ref 40–150)
ALT: 12 U/L (ref 0–55)
AST: 19 U/L (ref 5–34)
Albumin: 4.3 g/dL (ref 3.5–5.0)
Anion Gap: 8 mEq/L (ref 3–11)
BILIRUBIN TOTAL: 0.58 mg/dL (ref 0.20–1.20)
BUN: 8.3 mg/dL (ref 7.0–26.0)
CO2: 25 mEq/L (ref 22–29)
CREATININE: 0.8 mg/dL (ref 0.6–1.1)
Calcium: 9.6 mg/dL (ref 8.4–10.4)
Chloride: 106 mEq/L (ref 98–109)
Glucose: 76 mg/dl (ref 70–140)
Potassium: 3.6 mEq/L (ref 3.5–5.1)
Sodium: 139 mEq/L (ref 136–145)
Total Protein: 7.5 g/dL (ref 6.4–8.3)

## 2014-08-21 LAB — CBC WITH DIFFERENTIAL/PLATELET
BASO%: 0.4 % (ref 0.0–2.0)
BASOS ABS: 0 10*3/uL (ref 0.0–0.1)
EOS%: 3.2 % (ref 0.0–7.0)
Eosinophils Absolute: 0.2 10*3/uL (ref 0.0–0.5)
HEMATOCRIT: 42.7 % (ref 34.8–46.6)
HEMOGLOBIN: 14 g/dL (ref 11.6–15.9)
LYMPH%: 28.4 % (ref 14.0–49.7)
MCH: 29.4 pg (ref 25.1–34.0)
MCHC: 32.8 g/dL (ref 31.5–36.0)
MCV: 89.5 fL (ref 79.5–101.0)
MONO#: 0.3 10*3/uL (ref 0.1–0.9)
MONO%: 4.4 % (ref 0.0–14.0)
NEUT#: 3.6 10*3/uL (ref 1.5–6.5)
NEUT%: 63.6 % (ref 38.4–76.8)
Platelets: 266 10*3/uL (ref 145–400)
RBC: 4.77 10*6/uL (ref 3.70–5.45)
RDW: 13.1 % (ref 11.2–14.5)
WBC: 5.6 10*3/uL (ref 3.9–10.3)
lymph#: 1.6 10*3/uL (ref 0.9–3.3)

## 2014-08-26 ENCOUNTER — Other Ambulatory Visit: Payer: Self-pay | Admitting: Oncology

## 2014-08-28 ENCOUNTER — Telehealth: Payer: Self-pay | Admitting: Oncology

## 2014-08-28 ENCOUNTER — Ambulatory Visit (HOSPITAL_BASED_OUTPATIENT_CLINIC_OR_DEPARTMENT_OTHER): Payer: BC Managed Care – PPO | Admitting: Oncology

## 2014-08-28 VITALS — BP 131/67 | HR 52 | Temp 98.0°F | Resp 18 | Ht 70.0 in | Wt 163.7 lb

## 2014-08-28 DIAGNOSIS — Z1501 Genetic susceptibility to malignant neoplasm of breast: Secondary | ICD-10-CM

## 2014-08-28 DIAGNOSIS — C50511 Malignant neoplasm of lower-outer quadrant of right female breast: Secondary | ICD-10-CM

## 2014-08-28 DIAGNOSIS — C50512 Malignant neoplasm of lower-outer quadrant of left female breast: Secondary | ICD-10-CM

## 2014-08-28 NOTE — Telephone Encounter (Signed)
, °

## 2014-08-28 NOTE — Progress Notes (Signed)
ID: Betty [name]   DOB: 07-18-1976  MR#: [id number]  OZH#:[id number]  PCP: Leonides Sake, MD GYN: Dr. Molli Posey SU: Dr. Coralie Keens OTHER MD:  Allergist: Dr. Annamaria Boots  CHIEF COMPLAINTS:  1)  Hx of Right  Breast Cancer      2) BRCA 1 Positive    HISTORY OF PRESENT ILLNESS:  From the original intake note:  Betty [name] is a 38 year old woman from [name], BRCA 1 positive, who presented with a painful, palpable mass in her right breast that she first noticed in January 2013. A bilateral mammogram and right breast ultrasound performed on 02/20/2012 showed an irregular hypoechoic mass at 8 o clock position measuring 2.1 x 1.1 x 1.6 cm.  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  MRI scan performed on 02/27/2012 showed this mass to be 2.2 x 1.6 x 1.9 cm. No other masses were seen.  She underwent 4 cycles of FEC treatment from 03/15/12 to 04/27/12 followed by 4 cycles of Taxotere from 05/11/12 to 06/22/12.  She underwent bilateral simple mastectomies with right axillary sentinel lymph node biopsy by Dr. Ninfa Linden on 08/05/12 with complete pathological response.    Subsequent history is as detailed below.   INTERVAL HISTORY:   Hilliary returns today for follow-up of her stage II BRCA positive breast cancer. Interval history is generally unremarkable. 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 She enjoys the work, although it is consuming.  REVIEW OF SYSTEMS: Tashaya is exercising regularly. She has had no unusual headaches, visual changes, nausea, vomiting, dizziness, gait imbalance, cough, phlegm production, pleurisy, shortness of breath, chest pain or pressure, palpitations, or change in bowel or bladder habits. Her periods are regular. A detailed view of systems today was noncontributory  PAST MEDICAL HISTORY: Past Medical History   Diagnosis Date  . Allergic rhinitis   . Breast cancer, BRCA1 positive RIGHT INVASIVE DUCTAL CANCER---  S/P BILATERAL MASTECTOMY W/ RECONSTRUCTION  08-05-2012--  CHEMO ENEDED 06-22-2012    ONCOLOGIST- DR Jana Hakim    PAST SURGICAL HISTORY: Past Surgical History  Procedure Laterality Date  . Inguinal hernia repair  AS INFANT  . Shoulder surgery  1996  &  1999    - labrum and capsular repair  . Portacath placement  03/10/2012    Procedure: INSERTION PORT-A-CATH;  Surgeon: Harl Bowie, MD;  Location: [facility];  Service: General;  Laterality: N/A;  . Mastectomy w/ sentinel node biopsy  08/05/2012    Procedure: MASTECTOMY WITH SENTINEL LYMPH NODE BIOPSY;  Surgeon: Harl Bowie, MD;  Location: [place];  Service: General;  Laterality: Right;   AND LEFT MASTECTOMY  . Port-a-cath removal  08/05/2012    Procedure: REMOVAL PORT-A-CATH;  Surgeon: Harl Bowie, MD;  Location: [place];  Service: General;  Laterality: N/A;  . Tissue expander placement  08/05/2012    Procedure: TISSUE EXPANDER;  Surgeon: Theodoro Kos, DO;  Location: [place];  Service: Plastics;  Laterality: Bilateral;  With Expanders and Flex HD Placement   . Breast reconstruction  08/05/2012    Procedure: BREAST RECONSTRUCTION;  Surgeon: Theodoro Kos, DO;  Location: [place];  Service: Plastics;  Laterality: Bilateral;  . Transthoracic echocardiogram  03-08-2012    NORMAL LVSF/ EF 60-65%    FAMILY HISTORY Family History  Problem Relation Age of Onset  . Allergic rhinitis      grandmother  .  Ovarian cancer Mother 72  . Cancer Mother     ovarian  . Cancer Paternal Uncle     leukemia  . Cancer Paternal Uncle     lung cancer    GYNECOLOGIC HISTORY:   (Updated 02/13/2014) Periods interrupted with chemotherapy, resumed May 2014, and normalized. LMP, 01/20/2014. She is not planning on having further children.  SOCIAL HISTORY:  (Updated 02/13/2014) Mickel Baas and works as an Microbiologist of 2 elementary schools in [place]; previously she was a Pharmacist, [name] for x10 years. Divorced, has one son, Betty [name], currently 70 y/o.  HEALTH MAINTENANCE:  (Updated 02/13/2014) History  Substance Use Topics  . Smoking status: Never Smoker   . Smokeless tobacco: Never Used  . Alcohol Use: Yes     Comment: occas    PAP:  2013 with Dr. Matthew Saras  Bone density: Never  Lipid panel:  Not on file  Colonoscopy: Never  Allergies  Allergen Reactions  . Dextromethorphan Rash    Current Outpatient Prescriptions  Medication Sig Dispense Refill  . Chlorpheniramine-PSE-Ibuprofen (ADVIL ALLERGY SINUS PO) Take 1 tablet by mouth as needed. For allergies      . EPINEPHrine (EPIPEN) 0.3 mg/0.3 mL DEVI Inject 0.3 mg into the muscle once.       . Multiple Vitamin (MULTIVITAMIN WITH MINERALS) TABS Take 1 tablet by mouth daily.       No current facility-administered medications for this visit.    OBJECTIVE: Young white female who appears well  Filed Vitals:   08/28/14 1035  BP: 131/67  Pulse: 52  Temp: 98 F (36.7 C)  Resp: 18     Body mass index is 23.49 kg/(m^2).    ECOG FS: 0 Filed Weights   08/28/14 1035  Weight: 163 lb 11.2 oz (74.254 kg)   Sclerae unicteric, pupils equal and reactive Oropharynx clear and moist No cervical or supraclavicular adenopathy Lungs no rales or rhonchi Heart regular rate and rhythm Abd soft, nontender, positive bowel sounds MSK no focal spinal tenderness, no upper extremity lymphedema Neuro: nonfocal, well oriented, appropriate affect Breasts: Status post bilateral mastectomies with reconstruction. There is no evidence of local recurrence. Both axillae are benign.  LAB RESULTS: Lab Results  Component Value Date   WBC 5.6 08/21/2014   NEUTROABS 3.6 08/21/2014   HGB 14.0 08/21/2014   HCT 42.7 08/21/2014   MCV 89.5 08/21/2014   PLT 266 08/21/2014      Chemistry      Component Value Date/Time   NA 139 08/21/2014 1424   NA 136 09/01/2012 1641   K 3.6 08/21/2014 1424   K 3.9  09/01/2012 1641   CL 104 12/20/2012 1006   CL 99 09/01/2012 1641   CO2 25 08/21/2014 1424   CO2 23 09/01/2012 1641   BUN 8.3 08/21/2014 1424   BUN 12 09/01/2012 1641   CREATININE 0.8 08/21/2014 1424   CREATININE 0.66 09/01/2012 1641      Component Value Date/Time   CALCIUM 9.6 08/21/2014 1424   CALCIUM 9.7 09/01/2012 1641   ALKPHOS 54 08/21/2014 1424   ALKPHOS 104 09/01/2012 1641   AST 19 08/21/2014 1424   AST 23 09/01/2012 1641   ALT 12 08/21/2014 1424   ALT 13 09/01/2012 1641   BILITOT 0.58 08/21/2014 1424   BILITOT 0.1* 09/01/2012 1641     Results for ALIZAH, SILLS (MRN [id number]) as of 08/29/2014 11:57  Ref. Range 12/20/2012 10:06 02/13/2014 08:33  CA 125 Latest Range: 0.0-30.2 U/mL 4.6 11.5  STUDIES: No results found.  ASSESSMENT: 38 y.o. BRCA1 positive [IVS5-11T>G] [place] woman:  #1  S/p biopsy of the right breast on 02/20/12 showing a clinical T2, N0, M0, stage IIA  Invasive ductal carcinoma, grade 3, triple negative, with a Ki67 of 98%  #2  Completed 4 cycles of fluorouracil, epirubicin and cyclophosphamide from 03/15/12 to 04/27/12 followed by 4 cycles of docetaxel from 05/11/12 to 06/22/12.  #3  Bilateral simple mastectomies with right axillary sentinel lymph node biopsy on 08/05/12 showed a complete pathological response.  # BSO pending  PLAN:  We again discussed proceeding with bilateral salpingo-oophorectomy now versus waiting. She understands that we do not have any proven ways of screening for ovarian cancer. Trying to be numeric, in general BRCA1 patients have a 40% lifetime risk of developing ovarian cancer. That risk of course is not uniform over time. Younger patients develop ovarian cancer less often than older patients. If the distribution of ovarian cancer by age in [place] patients parallels that of the general population, then 7% of ovarian cancers would be diagnosed in patients under the age of 45.  In other words her risk of developing ovarian  cancer with a BRCA mutation before the age of 24 would be 7% of 40%, or about 3%. I suggested she use a 5% figure as a working number.  On the other hand when she undergoes BSO she will start having problems with hot flashes, vaginal dryness, mood changes, weight gain, weak bones and insomnia. Of course many of these symptoms can be ameliorated with various medical interventions.  Finally, since her last visit here we have results of the SOFT and TEXT trials and these show that for women who receive chemotherapy for breast cancer and continue to menstruate bilateral salpingo-oophorectomy reduces the risk of recurrence and death.  She is going to continue to think about all this. She already has a follow-up appointment with Dr. Matthew Saras. We will continue this discussion at her next visit here in April.    Chauncey Cruel, MD     08/28/2014

## 2014-09-14 ENCOUNTER — Encounter: Payer: Self-pay | Admitting: Internal Medicine

## 2014-09-26 ENCOUNTER — Encounter: Payer: Self-pay | Admitting: *Deleted

## 2014-09-26 NOTE — [facility] Oncology Navigator Note (Signed)
I called patient to check in.  Left voice mail message with contact information and request that she return my call. 

## 2014-10-20 ENCOUNTER — Other Ambulatory Visit: Payer: Self-pay | Admitting: Plastic Surgery

## 2014-10-20 ENCOUNTER — Telehealth: Payer: Self-pay | Admitting: *Deleted

## 2014-10-20 DIAGNOSIS — N644 Mastodynia: Secondary | ICD-10-CM

## 2014-10-20 DIAGNOSIS — N63 Unspecified lump in unspecified breast: Secondary | ICD-10-CM

## 2014-10-20 NOTE — Telephone Encounter (Signed)
This RN received a message from pt stating concern due to onset of swelling in left breast noted this am - " I was having some pain in the left breast but thought I had just pulled a muscle "  Per return call - Betty [name] states she proceeded to see Dr Migdalia Dk 9 Her PA ) today who has ordered antibiotic and follow up US and visit next week.  This RN reassured pt above was best action and no other needs or concerns at this time.

## 2014-10-24 ENCOUNTER — Ambulatory Visit
Admission: RE | Admit: 2014-10-24 | Discharge: 2014-10-24 | Disposition: A | Discharge status: Home / Self Care | Payer: BC Managed Care – PPO | Source: Ambulatory Visit | Attending: Plastic Surgery | Admitting: Plastic Surgery

## 2014-10-24 DIAGNOSIS — N63 Unspecified lump in unspecified breast: Secondary | ICD-10-CM

## 2014-10-24 DIAGNOSIS — N644 Mastodynia: Secondary | ICD-10-CM

## 2014-11-09 ENCOUNTER — Other Ambulatory Visit: Payer: Self-pay | Admitting: Plastic Surgery

## 2014-11-09 DIAGNOSIS — R609 Edema, unspecified: Secondary | ICD-10-CM

## 2014-11-10 ENCOUNTER — Ambulatory Visit
Admission: RE | Admit: 2014-11-10 | Discharge: 2014-11-10 | Disposition: A | Discharge status: Home / Self Care | Payer: BC Managed Care – PPO | Source: Ambulatory Visit | Attending: Plastic Surgery | Admitting: Plastic Surgery

## 2014-11-10 DIAGNOSIS — R609 Edema, unspecified: Secondary | ICD-10-CM

## 2014-11-10 MED ORDER — GADOBENATE DIMEGLUMINE 529 MG/ML IV SOLN
15.0000 mL | Freq: Once | INTRAVENOUS | Status: AC | PRN
  Administered 2014-11-10: 15 mL via INTRAVENOUS

## 2014-11-14 ENCOUNTER — Other Ambulatory Visit: Payer: Self-pay | Admitting: Oncology

## 2014-11-14 DIAGNOSIS — C50912 Malignant neoplasm of unspecified site of left female breast: Secondary | ICD-10-CM

## 2014-11-14 NOTE — Progress Notes (Unsigned)
Betty [name] apparently noted some changes in her left breast and a breast MRI was obtained by Dr. Migdalia Dk. This showed nothing in that area of concern. However there was a left internal mammary lymph node which had enlarged to 1.2 cm. There is suggesting a PET scan, which I think is a reasonable suggestion. I have called the patient at home to leave a message but have not been able to reach her so far.

## 2014-11-24 ENCOUNTER — Ambulatory Visit (HOSPITAL_COMMUNITY)
Admission: RE | Admit: 2014-11-24 | Discharge: 2014-11-24 | Disposition: A | Discharge status: Home / Self Care | Payer: BC Managed Care – PPO | Source: Ambulatory Visit | Attending: Oncology | Admitting: Oncology

## 2014-11-24 DIAGNOSIS — Z9013 Acquired absence of bilateral breasts and nipples: Secondary | ICD-10-CM | POA: Diagnosis not present

## 2014-11-24 DIAGNOSIS — C50912 Malignant neoplasm of unspecified site of left female breast: Secondary | ICD-10-CM | POA: Insufficient documentation

## 2014-11-24 LAB — GLUCOSE, CAPILLARY: Glucose-Capillary: 79 mg/dL (ref 70–99)

## 2014-11-24 MED ORDER — FLUDEOXYGLUCOSE F - 18 (FDG) INJECTION
8.1000 | Freq: Once | INTRAVENOUS | Status: AC | PRN
  Administered 2014-11-24: 8.1 via INTRAVENOUS

## 2014-11-27 ENCOUNTER — Telehealth: Payer: Self-pay | Admitting: *Deleted

## 2014-11-28 ENCOUNTER — Telehealth: Payer: Self-pay | Admitting: *Deleted

## 2014-11-28 NOTE — Telephone Encounter (Signed)
Pt called x 2 for results of PET scan performed last week.  Per discussion pt is aware PET results with only noted area of any concern is new low elevel internal mammary node.

## 2014-11-28 NOTE — Telephone Encounter (Signed)
No entry 

## 2014-12-01 ENCOUNTER — Telehealth: Payer: Self-pay | Admitting: Emergency Medicine

## 2014-12-01 ENCOUNTER — Other Ambulatory Visit: Payer: Self-pay | Admitting: Emergency Medicine

## 2014-12-01 DIAGNOSIS — C50519 Malignant neoplasm of lower-outer quadrant of unspecified female breast: Secondary | ICD-10-CM

## 2014-12-01 NOTE — Telephone Encounter (Signed)
Left patient a voicemail; informing her that an order was placed for a CT biopsy as recommended per her recent PET scan.  Advised patient that if the radiologist determines that they are unable to perform the biopsy due to the location of there area of concern this office will notify her.  If the radiologist is unable to perform the biopsy then a plan of care will be established once Dr Jana Hakim returns to the office.   Advised patient to call this office with any concerns.

## 2014-12-05 ENCOUNTER — Other Ambulatory Visit: Payer: Self-pay | Admitting: Obstetrics and Gynecology

## 2014-12-06 LAB — CYTOLOGY - PAP

## 2014-12-07 ENCOUNTER — Telehealth: Payer: Self-pay | Admitting: *Deleted

## 2014-12-07 ENCOUNTER — Telehealth: Payer: Self-pay | Admitting: Emergency Medicine

## 2014-12-07 NOTE — Telephone Encounter (Signed)
Patient calls to ask when her CT biopsy is going to be scheduled.  Spoke with Tiffany in Radiology.  Biopsy actually needs to be done under ultrasound.  She will send a note to Dr. Jana Hakim to re-order to reflect that.  Jonelle Sidle is looking at Monday to schedule this.  She will call the patient.   I called the patient to let her know that the biopsy will probably be Monday, but that they will be calling her to schedule that with a time and instructions.  She appreciated my checking.

## 2014-12-07 NOTE — Telephone Encounter (Signed)
Per Anderson Malta with IR; Dr Kathlene Cote is unable to do the biospy.   Notify patient of above and advised her that Dr Jana Hakim will be calling her within the next few days to discuss plan of care with her.   Patient verbalized understanding.

## 2014-12-08 ENCOUNTER — Other Ambulatory Visit: Payer: Self-pay | Admitting: Oncology

## 2014-12-10 ENCOUNTER — Other Ambulatory Visit: Payer: Self-pay | Admitting: Oncology

## 2014-12-10 DIAGNOSIS — C50912 Malignant neoplasm of unspecified site of left female breast: Secondary | ICD-10-CM

## 2014-12-11 ENCOUNTER — Ambulatory Visit (HOSPITAL_COMMUNITY): Payer: BC Managed Care – PPO

## 2014-12-11 ENCOUNTER — Other Ambulatory Visit: Payer: Self-pay | Admitting: Oncology

## 2014-12-12 ENCOUNTER — Other Ambulatory Visit: Payer: Self-pay | Admitting: *Deleted

## 2014-12-12 ENCOUNTER — Other Ambulatory Visit: Payer: Self-pay | Admitting: Oncology

## 2014-12-12 DIAGNOSIS — C50919 Malignant neoplasm of unspecified site of unspecified female breast: Secondary | ICD-10-CM

## 2014-12-12 NOTE — Progress Notes (Unsigned)
I called Betty [name] to review her situation. In brief: She had complained of some changes in her left breast so Dr. Migdalia Dk obtained a left breast MRI. This showed a 1.2 cm left internal mammary lymph node. Recall the patient's earlier breast cancer was on the right side.  EXAM: BILATERAL BREAST MRI WITH AND WITHOUT CONTRAST   IMPRESSION: No focal abnormality over the upper outer left breast to correspond to patient's questionable palpable area/pain/ swelling. Suspicious interval enlargement of a 0.8 x 1.2 cm left internal mammary lymph node just above the level of the sternomanubrial junction. RECOMMENDATION: Recommend PET-CT for further evaluation. If negative PET-CT in this area, would recommend followup MRI 6 months. BI-RADS CATEGORY 0: Incomplete. Need additional imaging evaluation and/or prior mammograms for comparison. Electronically Signed  By: Betty [name] M.D.  On: 11/13/2014 13:28  PET scan was suggested and was obtained 11/24/2013. This showed no area of concern outside of the mildly enlarged left internal mammary lymph node which had low level, nonmalignant range hypermetabolism:  Low-level, non malignant range hypermetabolism corresponds to a left internal mammary node which is new since the prior PET. 1.1 x 0.8 cm and a S.U.V. max of 1.6 on image 66. No hypermetabolic axillary, mediastinal, or hilar nodes.  I discussed the possibility of biopsy with Dr. Kathlene [name] in radiology and he feels the this would be very dangerous and the yield would likely be small.  I called Betty [name] today and explained that it really doesn't make any sense for her right breast cancer to have jumped over to the left internal mammary chain. There is no direct connection there. But we are actually seeing is probably some inflammation residual from her left reconstructive surgery. I agree we should not simply ignore this, but my suggestion is that we repeat an MRI of this area in about 3  months.  After much discussion she was able to follow this and agrees with it. The plan accordingly will be for a repeat MRI of this area in about 2-1/2 months and then to see me to discuss results. She will call if any other problems develop before that visit.

## 2014-12-13 ENCOUNTER — Telehealth: Payer: Self-pay | Admitting: Oncology

## 2014-12-13 NOTE — Telephone Encounter (Signed)
per pof to sch pt US-CS will call pt to sch

## 2014-12-17 ENCOUNTER — Other Ambulatory Visit: Payer: Self-pay | Admitting: Oncology

## 2014-12-22 NOTE — Telephone Encounter (Signed)
None

## 2015-02-16 ENCOUNTER — Other Ambulatory Visit: Payer: Self-pay | Admitting: *Deleted

## 2015-02-16 DIAGNOSIS — C50519 Malignant neoplasm of lower-outer quadrant of unspecified female breast: Secondary | ICD-10-CM

## 2015-02-19 ENCOUNTER — Ambulatory Visit (HOSPITAL_COMMUNITY)
Admission: RE | Admit: 2015-02-19 | Discharge: 2015-02-19 | Disposition: A | Discharge status: Home / Self Care | Payer: BC Managed Care – PPO | Source: Ambulatory Visit | Attending: Oncology | Admitting: Oncology

## 2015-02-19 ENCOUNTER — Other Ambulatory Visit: Payer: BC Managed Care – PPO

## 2015-02-19 DIAGNOSIS — C50919 Malignant neoplasm of unspecified site of unspecified female breast: Secondary | ICD-10-CM | POA: Insufficient documentation

## 2015-02-19 MED ORDER — GADOBENATE DIMEGLUMINE 529 MG/ML IV SOLN
15.0000 mL | Freq: Once | INTRAVENOUS | Status: AC | PRN
  Administered 2015-02-19: 15 mL via INTRAVENOUS

## 2015-02-20 ENCOUNTER — Telehealth: Payer: Self-pay | Admitting: Oncology

## 2015-02-20 NOTE — Telephone Encounter (Signed)
pt cld to r/s lab appt-gave pt copy of sch

## 2015-02-22 ENCOUNTER — Other Ambulatory Visit (HOSPITAL_BASED_OUTPATIENT_CLINIC_OR_DEPARTMENT_OTHER): Payer: BC Managed Care – PPO

## 2015-02-22 DIAGNOSIS — C50519 Malignant neoplasm of lower-outer quadrant of unspecified female breast: Secondary | ICD-10-CM

## 2015-02-22 DIAGNOSIS — C50511 Malignant neoplasm of lower-outer quadrant of right female breast: Secondary | ICD-10-CM | POA: Diagnosis not present

## 2015-02-22 LAB — COMPREHENSIVE METABOLIC PANEL (CC13)
ALBUMIN: 3.8 g/dL (ref 3.5–5.0)
ALK PHOS: 46 U/L (ref 40–150)
ALT: 15 U/L (ref 0–55)
AST: 19 U/L (ref 5–34)
Anion Gap: 9 mEq/L (ref 3–11)
BILIRUBIN TOTAL: 0.37 mg/dL (ref 0.20–1.20)
BUN: 13.8 mg/dL (ref 7.0–26.0)
CO2: 22 meq/L (ref 22–29)
Calcium: 8.6 mg/dL (ref 8.4–10.4)
Chloride: 108 mEq/L (ref 98–109)
Creatinine: 0.8 mg/dL (ref 0.6–1.1)
Glucose: 86 mg/dl (ref 70–140)
Potassium: 4 mEq/L (ref 3.5–5.1)
Sodium: 139 mEq/L (ref 136–145)
TOTAL PROTEIN: 6.7 g/dL (ref 6.4–8.3)

## 2015-02-22 LAB — CBC WITH DIFFERENTIAL/PLATELET
BASO%: 0.5 % (ref 0.0–2.0)
Basophils Absolute: 0 10*3/uL (ref 0.0–0.1)
EOS%: 3.7 % (ref 0.0–7.0)
Eosinophils Absolute: 0.2 10*3/uL (ref 0.0–0.5)
HCT: 40.2 % (ref 34.8–46.6)
HEMOGLOBIN: 13.1 g/dL (ref 11.6–15.9)
LYMPH%: 28.5 % (ref 14.0–49.7)
MCH: 28.7 pg (ref 25.1–34.0)
MCHC: 32.5 g/dL (ref 31.5–36.0)
MCV: 88.4 fL (ref 79.5–101.0)
MONO#: 0.2 10*3/uL (ref 0.1–0.9)
MONO%: 5.2 % (ref 0.0–14.0)
NEUT#: 2.7 10*3/uL (ref 1.5–6.5)
NEUT%: 62.1 % (ref 38.4–76.8)
Platelets: 227 10*3/uL (ref 145–400)
RBC: 4.55 10*6/uL (ref 3.70–5.45)
RDW: 13.5 % (ref 11.2–14.5)
WBC: 4.4 10*3/uL (ref 3.9–10.3)
lymph#: 1.3 10*3/uL (ref 0.9–3.3)

## 2015-02-23 LAB — CA 125: CA 125: 18 U/mL (ref <35)

## 2015-02-26 ENCOUNTER — Ambulatory Visit (HOSPITAL_BASED_OUTPATIENT_CLINIC_OR_DEPARTMENT_OTHER): Payer: BC Managed Care – PPO | Admitting: Oncology

## 2015-02-26 VITALS — BP 130/75 | HR 59 | Temp 98.4°F | Resp 18 | Ht 70.0 in | Wt 169.3 lb

## 2015-02-26 DIAGNOSIS — Z171 Estrogen receptor negative status [ER-]: Secondary | ICD-10-CM | POA: Diagnosis not present

## 2015-02-26 DIAGNOSIS — R599 Enlarged lymph nodes, unspecified: Secondary | ICD-10-CM | POA: Diagnosis not present

## 2015-02-26 DIAGNOSIS — C50519 Malignant neoplasm of lower-outer quadrant of unspecified female breast: Secondary | ICD-10-CM

## 2015-02-26 DIAGNOSIS — C50511 Malignant neoplasm of lower-outer quadrant of right female breast: Secondary | ICD-10-CM

## 2015-02-26 DIAGNOSIS — Z1509 Genetic susceptibility to other malignant neoplasm: Secondary | ICD-10-CM

## 2015-02-26 DIAGNOSIS — Z1501 Genetic susceptibility to malignant neoplasm of breast: Secondary | ICD-10-CM

## 2015-02-26 NOTE — Progress Notes (Signed)
ID: Betty [name]   DOB: 1976/09/07  MR#: [id number]  STM#:[id number]  PCP: Betty Sake, MD GYN: Dr. Molli [name] SU: Dr. Coralie [name] OTHER MD:  Allergist: Dr. Annamaria [name]  CHIEF COMPLAINTS:  1)  Hx of Right  Breast Cancer      2) BRCA 1 Positive    HISTORY OF PRESENT ILLNESS:  From the original intake note:  Betty [name] is a 39 year old [name] from [facility], BRCA 1 positive, who presented with a painful, palpable mass in her right breast that she first noticed in January 2013. A bilateral mammogram and right breast ultrasound performed on 02/20/2012 showed an irregular hypoechoic mass at 8 o clock position measuring 2.1 x 1.1 x 1.6 cm.  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  MRI scan performed on 02/27/2012 showed this mass to be 2.2 x 1.6 x 1.9 cm. No other masses were seen.  She underwent 4 cycles of FEC treatment from 03/15/12 to 04/27/12 followed by 4 cycles of Taxotere from 05/11/12 to 06/22/12.  She underwent bilateral simple mastectomies with right axillary sentinel lymph node biopsy by Dr. Ninfa [name] on 08/05/12 with complete pathological response.    Subsequent history is as detailed below.   INTERVAL HISTORY:   Betty [name] returns today for follow-up of her breast cancer. Interval history is significant for her mother having had a subarachnoid hemorrhage within the last few days. She is currently under observation and further evaluation at [facility] herself is working still at [employer]. She is now menstruating regularly and there are significant perimenopausal symptoms associated with this which are getting in her weight professionally as well as personally.  REVIEW OF SYSTEMS: [name] is exercising regularly. She has had no unusual headaches, visual changes, nausea, vomiting, dizziness, gait imbalance, cough, phlegm production, pleurisy,  shortness of breath, chest pain or pressure, palpitations, or change in bowel or bladder habits. Her periods are regular. Unfortunately they are accompanied by significant perimenstrual symptoms and she feels emotionally this is difficult for her in terms of work in being able to care for her family. She wonders what options are may be. At this point she is still not ready to undergo bilateral salpingo-oophorectomy. A detailed view of systems today was noncontributory  PAST MEDICAL HISTORY: Past Medical History  Diagnosis Date  . Allergic rhinitis   . Breast cancer, BRCA1 positive RIGHT INVASIVE DUCTAL CANCER---  S/P BILATERAL MASTECTOMY W/ RECONSTRUCTION  08-05-2012--  CHEMO ENEDED 06-22-2012    ONCOLOGIST- DR Betty [name]    PAST SURGICAL HISTORY: Past Surgical History  Procedure Laterality Date  . Inguinal hernia repair  AS INFANT  . Shoulder surgery  1996  &  1999    - labrum and capsular repair  . Portacath placement  03/10/2012    Procedure: INSERTION PORT-A-CATH;  Surgeon: Betty Bowie, MD;  Location[facility];  Service: General;  Laterality: N/A;  . Mastectomy w/ sentinel node biopsy  08/05/2012    Procedure: MASTECTOMY WITH SENTINEL LYMPH NODE BIOPSY;  Surgeon: Betty Bowie, MD;  Location: [place];  Service: General;  Laterality: Right;   AND LEFT MASTECTOMY  . Port-a-cath removal  08/05/2012    Procedure: REMOVAL PORT-A-CATH;  Surgeon: Betty Bowie, MD;  Location: [place];  Service: General;  Laterality: N/A;  . Tissue expander placement  08/05/2012    Procedure: TISSUE EXPANDER;  Surgeon: Betty Kos, DO;  Location: [place];  Service: Plastics;  Laterality: Bilateral;  With Expanders and Flex HD Placement   . Breast reconstruction  08/05/2012    Procedure: BREAST RECONSTRUCTION;  Surgeon: Betty Kos, DO;  Location: [place];  Service: Plastics;  Laterality: Bilateral;  . Transthoracic echocardiogram  03-08-2012    NORMAL LVSF/ EF 60-65%    FAMILY HISTORY Family History   Problem Relation Age of Onset  . Allergic rhinitis      grandmother  . Ovarian cancer Mother 72  . Cancer Mother     ovarian  . Cancer Paternal Uncle     leukemia  . Cancer Paternal Uncle     lung cancer    GYNECOLOGIC HISTORY:   (Updated 02/13/2014) Periods interrupted with chemotherapy, resumed May 2014, and normalized. LMP, 01/20/2014. 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 Divorced, has one son, Betty [name], currently 73 y/o, who lives with her. Betty [name] also has a significant other, Betty [name], who is a Futures trader for [employer].  HEALTH MAINTENANCE:  (Updated 02/13/2014) History  Substance Use Topics  . Smoking status: Never Smoker   . Smokeless tobacco: Never Used  . Alcohol Use: Yes     Comment: occas    PAP:  2013 with Dr. Matthew [name]  Bone density: Never  Lipid panel:  Not on file  Colonoscopy: Never  Allergies  Allergen Reactions  . Dextromethorphan Rash    Current Outpatient Prescriptions  Medication Sig Dispense Refill  . Chlorpheniramine-PSE-Ibuprofen (ADVIL ALLERGY SINUS PO) Take 1 tablet by mouth as needed. For allergies    . EPINEPHrine (EPIPEN) 0.3 mg/0.3 mL DEVI Inject 0.3 mg into the muscle once.     . Multiple Vitamin (MULTIVITAMIN WITH MINERALS) TABS Take 1 tablet by mouth daily.     No current facility-administered medications for this visit.    OBJECTIVE: Young white female in no acute distress Filed Vitals:   02/26/15 1542  BP: 130/75  Pulse: 59  Temp: 98.4 F (36.9 C)  Resp: 18     Body mass index is 24.29 kg/(m^2).    ECOG FS: 0 Filed Weights   02/26/15 1542  Weight: 169 lb 4.8 oz (76.794 kg)   Sclerae unicteric, EOMs intact Oropharynx clear, good dentition No cervical or supraclavicular adenopathy Lungs no rales or rhonchi Heart regular rate and  rhythm Abd soft, nontender, positive bowel sounds MSK no focal spinal tenderness, no upper extremity lymphedema Neuro: nonfocal, well oriented, positive affect Breasts: Status post bilateral mastectomies with implant reconstruction. There is no evidence of chest wall recurrence. Both axillae are benign.  LAB RESULTS: Lab Results  Component Value Date   WBC 4.4 02/22/2015   NEUTROABS 2.7 02/22/2015   HGB 13.1 02/22/2015   HCT 40.2 02/22/2015   MCV 88.4 02/22/2015   PLT 227 02/22/2015      Chemistry      Component Value Date/Time   NA 139 02/22/2015 0800   NA 136 09/01/2012 1641   K 4.0 02/22/2015 0800   K 3.9 09/01/2012 1641   CL 104 12/20/2012 1006   CL 99 09/01/2012 1641   CO2 22 02/22/2015 0800   CO2 23 09/01/2012 1641   BUN 13.8 02/22/2015 0800   BUN 12 09/01/2012 1641   CREATININE 0.8 02/22/2015 0800   CREATININE 0.66 09/01/2012 1641      Component Value Date/Time  CALCIUM 8.6 02/22/2015 0800   CALCIUM 9.7 09/01/2012 1641   ALKPHOS 46 02/22/2015 0800   ALKPHOS 104 09/01/2012 1641   AST 19 02/22/2015 0800   AST 23 09/01/2012 1641   ALT 15 02/22/2015 0800   ALT 13 09/01/2012 1641   BILITOT 0.37 02/22/2015 0800   BILITOT 0.1* 09/01/2012 1641      STUDIES: Mr Larose Kells Wo Contrast  02/19/2015   CLINICAL DATA:  Sternal pain.  History of breast cancer.  EXAM: MR STERNUM WITHOUT AND WITH CONTRAST  TECHNIQUE: Multiplanar, multisequence MR imaging was performed both before and after administration of intravenous contrast.  CONTRAST:  24m MULTIHANCE GADOBENATE DIMEGLUMINE 529 MG/ML IV SOLN  COMPARISON:  PET-CT 11/24/2014  FINDINGS: No sternal bone lesions are identified. The thoracic vertebral bodies are normal. No findings for metastatic disease. A the lower cervical disc protrusion is noted incidentally. The visualized ribs are unremarkable. The sternoclavicular joints are intact. No clavicle lesions. Bilateral breast prosthesis are noted.  The small left internal  mammary lymph node noted on the PET-CT E appears relatively stable. It measures a maximum of 8.5 mm and is just lateral to the internal mammary are during pain. No other lymph nodes are identified. No mediastinal adenopathy is identified.  IMPRESSION: 1. No MR findings to suggest bony metastatic disease involving the sternum or thoracic vertebral bodies. 2. Stable appearing left internal mammary lymph node. It measures a maximum of 8.5 mm.   Electronically Signed   By: PMarijo SanesM.D.   On: 02/19/2015 17:00     ASSESSMENT: 39y.o. BRCA1 positive [IVS5-11T>G] [place] [name]:  #1  S/p biopsy of the right breast on 02/20/12 showing a clinical T2, N0, M0, stage IIA  Invasive ductal carcinoma, grade 3, triple negative, with a Ki67 of 98%  #2  Completed 4 cycles of fluorouracil, epirubicin and cyclophosphamide from 03/15/12 to 04/27/12 followed by 4 cycles of docetaxel from 05/11/12 to 06/22/12.  #3  Bilateral simple mastectomies with right axillary sentinel lymph node biopsy on 08/05/12 showed a complete pathological response.  (a) bilateral implant reconstruction completed 03/02/2013  #4 BSO pending further childbearing  #5 left internal mammary lymph node originally noted on bilateral breast MRI 11/10/2014, stable  PLAN:  Betty [name] doing great as far as her breast cancer is concerned, with no clinical evidence of active disease. The left internal mammary lymph node is stable to slightly smaller. There is no other findings suspicious for disease progression. This requires only follow-up and we are going to repeat an MRI of the sternum late July with that in mind.  She would like "something done" regarding her periods. When she was doing before of course was birth control pills. She understandably would like to discuss other options and she will discuss those with Dr. HMatthew Saraswhen she sees him next. In particular at this point, she is not closing the door to possible childbearing and certainly if she  wished to get pregnant from a rest cancer point of view there would be no contraindication. She realizes at some point she will have to undergo bilateral salpingo-oophorectomy.  She will see me in a little over 3 months, after her next sternal MRI. She knows to call for any problems that may develop before her next visit here.  MChauncey Cruel MD     02/26/2015

## 2015-05-22 ENCOUNTER — Other Ambulatory Visit (HOSPITAL_BASED_OUTPATIENT_CLINIC_OR_DEPARTMENT_OTHER): Payer: BC Managed Care – PPO

## 2015-05-22 DIAGNOSIS — C50519 Malignant neoplasm of lower-outer quadrant of unspecified female breast: Secondary | ICD-10-CM

## 2015-05-22 DIAGNOSIS — Z1509 Genetic susceptibility to other malignant neoplasm: Secondary | ICD-10-CM

## 2015-05-22 DIAGNOSIS — Z1501 Genetic susceptibility to malignant neoplasm of breast: Secondary | ICD-10-CM

## 2015-05-22 DIAGNOSIS — C50511 Malignant neoplasm of lower-outer quadrant of right female breast: Secondary | ICD-10-CM

## 2015-05-22 LAB — COMPREHENSIVE METABOLIC PANEL (CC13)
ALBUMIN: 4.2 g/dL (ref 3.5–5.0)
ALT: 13 U/L (ref 0–55)
ANION GAP: 9 meq/L (ref 3–11)
AST: 22 U/L (ref 5–34)
Alkaline Phosphatase: 57 U/L (ref 40–150)
BUN: 14.5 mg/dL (ref 7.0–26.0)
CHLORIDE: 103 meq/L (ref 98–109)
CO2: 24 mEq/L (ref 22–29)
Calcium: 9.3 mg/dL (ref 8.4–10.4)
Creatinine: 0.8 mg/dL (ref 0.6–1.1)
EGFR: >90 mL/min/{1.73_m2} (ref >90)
Glucose: 92 mg/dl (ref 70–140)
Potassium: 3.9 mEq/L (ref 3.5–5.1)
Sodium: 136 mEq/L (ref 136–145)
Total Bilirubin: 0.42 mg/dL (ref 0.20–1.20)
Total Protein: 6.9 g/dL (ref 6.4–8.3)

## 2015-05-22 LAB — CBC WITH DIFFERENTIAL/PLATELET
BASO%: 0.6 % (ref 0.0–2.0)
BASOS ABS: 0 10*3/uL (ref 0.0–0.1)
EOS%: 3.2 % (ref 0.0–7.0)
Eosinophils Absolute: 0.2 10*3/uL (ref 0.0–0.5)
HCT: 38.4 % (ref 34.8–46.6)
HEMOGLOBIN: 12.8 g/dL (ref 11.6–15.9)
LYMPH%: 24.8 % (ref 14.0–49.7)
MCH: 29.7 pg (ref 25.1–34.0)
MCHC: 33.2 g/dL (ref 31.5–36.0)
MCV: 89.4 fL (ref 79.5–101.0)
MONO#: 0.3 10*3/uL (ref 0.1–0.9)
MONO%: 4.7 % (ref 0.0–14.0)
NEUT#: 4.2 10*3/uL (ref 1.5–6.5)
NEUT%: 66.7 % (ref 38.4–76.8)
PLATELETS: 290 10*3/uL (ref 145–400)
RBC: 4.3 10*6/uL (ref 3.70–5.45)
RDW: 13.7 % (ref 11.2–14.5)
WBC: 6.3 10*3/uL (ref 3.9–10.3)
lymph#: 1.6 10*3/uL (ref 0.9–3.3)

## 2015-05-23 LAB — CANCER ANTIGEN 27.29: CA 27.29: 18 U/mL (ref 0–39)

## 2015-05-29 ENCOUNTER — Ambulatory Visit (HOSPITAL_BASED_OUTPATIENT_CLINIC_OR_DEPARTMENT_OTHER): Payer: BC Managed Care – PPO | Admitting: Oncology

## 2015-05-29 VITALS — BP 129/75 | HR 58 | Temp 97.9°F | Resp 18 | Ht 70.0 in | Wt 173.7 lb

## 2015-05-29 DIAGNOSIS — Z853 Personal history of malignant neoplasm of breast: Secondary | ICD-10-CM

## 2015-05-29 DIAGNOSIS — C50911 Malignant neoplasm of unspecified site of right female breast: Secondary | ICD-10-CM

## 2015-05-29 NOTE — Progress Notes (Signed)
ID: Betty [name]   DOB: 1976-01-27  MR#: [id number]  RSW#:[id number]  PCP: Leonides Sake, MD GYN: Dr. Molli Posey SU: Dr. Coralie Keens OTHER MD:  Allergist: Dr. Annamaria Boots  CHIEF COMPLAINTS:  1)  Hx of triple negative Breast Cancer      2) BRCA 1 Positive    HISTORY OF PRESENT ILLNESS:  From the original intake note:  Betty [name] is a 39 year old woman from [facility], BRCA 1 positive, who presented with a painful, palpable mass in her right breast that she first noticed in January 2013. A bilateral mammogram and right breast ultrasound performed on 02/20/2012 showed an irregular hypoechoic mass at 8 o clock position measuring 2.1 x 1.1 x 1.6 cm.  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  MRI scan performed on 02/27/2012 showed this mass to be 2.2 x 1.6 x 1.9 cm. No other masses were seen.  She underwent 4 cycles of FEC treatment from 03/15/12 to 04/27/12 followed by 4 cycles of Taxotere from 05/11/12 to 06/22/12.  She underwent bilateral simple mastectomies with right axillary sentinel lymph node biopsy by Dr. Ninfa Linden on 08/05/12 with complete pathological response.    Subsequent history is as detailed below.   INTERVAL HISTORY:   Betty [name] returns today for follow-up of her stage II BRCA positive breast cancer. The interval history is unremarkable. She is not working this summer, and she is enjoying going to [employer] and spending time with her son.  REVIEW OF SYSTEMS: Betty [name] exercises mostly by walking. She is concerned about weight gain and is thinking about a mild diet. She has some premenstrual cramps and was started on a new medication by Dr. Matthew Saras but she doesn't recall what this. A detailed review of systems today was otherwise noncontributory  PAST MEDICAL HISTORY: Past Medical History  Diagnosis Date  . Allergic rhinitis   . Breast cancer, BRCA1  positive RIGHT INVASIVE DUCTAL CANCER---  S/P BILATERAL MASTECTOMY W/ RECONSTRUCTION  08-05-2012--  CHEMO ENEDED 06-22-2012    ONCOLOGIST- DR Jana Hakim    PAST SURGICAL HISTORY: Past Surgical History  Procedure Laterality Date  . Inguinal hernia repair  AS INFANT  . Shoulder surgery  1996  &  1999    - labrum and capsular repair  . Portacath placement  03/10/2012    Procedure: INSERTION PORT-A-CATH;  Surgeon: Harl [place], MD;  Location: [facility];  Service: General;  Laterality: N/A;  . Mastectomy w/ sentinel node biopsy  08/05/2012    Procedure: MASTECTOMY WITH SENTINEL LYMPH NODE BIOPSY;  Surgeon: Harl [place]ie, MD;  Location: [place];  Service: General;  Laterality: Right;   AND LEFT MASTECTOMY  . Port-a-cath removal  08/05/2012    Procedure: REMOVAL PORT-A-CATH;  Surgeon: Harl [place], MD;  Location: [place];  Service: General;  Laterality: N/A;  . Tissue expander placement  08/05/2012    Procedure: TISSUE EXPANDER;  Surgeon: Theodoro Kos, DO;  Location: [place];  Service: Plastics;  Laterality: Bilateral;  With Expanders and Flex HD Placement   . Breast reconstruction  08/05/2012    Procedure: BREAST RECONSTRUCTION;  Surgeon: Theodoro Kos, DO;  Location: [place];  Service: Plastics;  Laterality: Bilateral;  . Transthoracic echocardiogram  03-08-2012    NORMAL LVSF/ EF 60-65%    FAMILY HISTORY Family History  Problem Relation Age of Onset  . Allergic rhinitis      grandmother  .  Ovarian cancer Mother 49  . Cancer Mother     ovarian  . Cancer Paternal Uncle     leukemia  . Cancer Paternal Uncle     lung cancer    GYNECOLOGIC HISTORY:   (Updated 02/13/2014) Periods interrupted with chemotherapy, resumed May 2014, and normalized. LMP, 01/20/2014. She is not planning on having further children.  SOCIAL HISTORY:  (Updated 02/13/2014) Betty [name] and works as an Microbiologist of 2 elementary schools in [place]; previously she was a Pharmacist, hospital for x10 years. Divorced,  has one son, Betty [name], currently 82 y/o.  HEALTH MAINTENANCE:  (Updated 02/13/2014) History  Substance Use Topics  . Smoking status: Never Smoker   . Smokeless tobacco: Never Used  . Alcohol Use: Yes     Comment: occas    PAP:  2013 with Dr. Matthew Saras  Bone density: Never  Lipid panel:  Not on file  Colonoscopy: Never  Allergies  Allergen Reactions  . Dextromethorphan Rash    Current Outpatient Prescriptions  Medication Sig Dispense Refill  . Chlorpheniramine-PSE-Ibuprofen (ADVIL ALLERGY SINUS PO) Take 1 tablet by mouth as needed. For allergies    . EPINEPHrine (EPIPEN) 0.3 mg/0.3 mL DEVI Inject 0.3 mg into the muscle once.     . Multiple Vitamin (MULTIVITAMIN WITH MINERALS) TABS Take 1 tablet by mouth daily.     No current facility-administered medications for this visit.    OBJECTIVE: Young white female in no acute distress Filed Vitals:   05/29/15 1133  BP: 129/75  Pulse: 58  Temp: 97.9 F (36.6 C)  Resp: 18     Body mass index is 24.92 kg/(m^2).    ECOG FS: 0 Filed Weights   05/29/15 1133  Weight: 173 lb 11.2 oz (78.79 kg)   Sclerae unicteric, pupils round and equal Oropharynx clear and moist-- no thrush or other lesions No cervical or supraclavicular adenopathy Lungs no rales or rhonchi Heart regular rate and rhythm Abd soft, nontender, positive bowel sounds MSK no focal spinal tenderness, no upper extremity lymphedema Neuro: nonfocal, well oriented, appropriate affect Breasts: Status post bilateral mastectomies with bilateral reconstruction. There is no evidence of chest wall recurrence. Both axillae are benign.   LAB RESULTS: Lab Results  Component Value Date   WBC 6.3 05/22/2015   NEUTROABS 4.2 05/22/2015   HGB 12.8 05/22/2015   HCT 38.4 05/22/2015   MCV 89.4 05/22/2015   PLT 290 05/22/2015      Chemistry      Component Value Date/Time   NA 136 05/22/2015 1406   NA 136 09/01/2012 1641   K 3.9 05/22/2015 1406   K 3.9 09/01/2012 1641   CL 104  12/20/2012 1006   CL 99 09/01/2012 1641   CO2 24 05/22/2015 1406   CO2 23 09/01/2012 1641   BUN 14.5 05/22/2015 1406   BUN 12 09/01/2012 1641   CREATININE 0.8 05/22/2015 1406   CREATININE 0.66 09/01/2012 1641      Component Value Date/Time   CALCIUM 9.3 05/22/2015 1406   CALCIUM 9.7 09/01/2012 1641   ALKPHOS 57 05/22/2015 1406   ALKPHOS 104 09/01/2012 1641   AST 22 05/22/2015 1406   AST 23 09/01/2012 1641   ALT 13 05/22/2015 1406   ALT 13 09/01/2012 1641   BILITOT 0.42 05/22/2015 1406   BILITOT 0.1* 09/01/2012 1641     Results for Betty, [name] (MRN [id number]) as of 05/29/2015 11:51  Ref. Range 12/20/2012 10:06 02/13/2014 08:33 02/22/2015 08:00  CA 125 Latest Ref Range: <35 U/mL  4.6 11.5 18   STUDIES: No results found.  ASSESSMENT: 39 y.o. BRCA1 positive [IVS5-11T>G] [place] woman:  #1  S/p biopsy of the right breast on 02/20/12 showing a clinical T2, N0, M0, stage IIA  Invasive ductal carcinoma, grade 3, triple negative, with a Ki67 of 98%  #2  Completed 4 cycles of fluorouracil, epirubicin and cyclophosphamide from 03/15/12 to 04/27/12 followed by 4 cycles of docetaxel from 05/11/12 to 06/22/12.  #3  Bilateral simple mastectomies with right axillary sentinel lymph node biopsy on 08/05/12 showed a complete pathological response.  #4 BSO pending  PLAN:  Raynisha looks terrific and there is certainly no symptoms suggestive of recurrent or new cancer. She is thinking about bilateral salpingo-oophorectomy but not urgently. She understands the standard recommendation is to undergo this procedure between 35 and 40.  She is having screening ultrasonography under Dr. Matthew Saras and that is recommended. On the other hand we really do not know that we can find ovarian cancer early enough to make a difference to prognosis.  Since she is seeing Dr. Matthew Saras twice a year and seen Dr. Ninfa Linden yearly, I think if she sees me once a year that would be quite adequate. She will see me accordingly  a year from now. We will repeat lab work a week before that.  Mida has a good understanding of this plan. She agrees with it. She will call with any problems that may develop before her next visit here.    Chauncey Cruel, MD     05/29/2015

## 2015-08-07 ENCOUNTER — Ambulatory Visit (HOSPITAL_COMMUNITY)
Admission: RE | Admit: 2015-08-07 | Discharge: 2015-08-07 | Disposition: A | Discharge status: Home / Self Care | Payer: BC Managed Care – PPO | Source: Ambulatory Visit | Attending: Oncology | Admitting: Oncology

## 2015-08-07 ENCOUNTER — Other Ambulatory Visit: Payer: Self-pay | Admitting: Oncology

## 2015-08-07 DIAGNOSIS — C50519 Malignant neoplasm of lower-outer quadrant of unspecified female breast: Secondary | ICD-10-CM | POA: Insufficient documentation

## 2015-08-07 DIAGNOSIS — Z1509 Genetic susceptibility to other malignant neoplasm: Secondary | ICD-10-CM

## 2015-08-07 DIAGNOSIS — Z1501 Genetic susceptibility to malignant neoplasm of breast: Secondary | ICD-10-CM | POA: Insufficient documentation

## 2015-08-07 MED ORDER — GADOBENATE DIMEGLUMINE 529 MG/ML IV SOLN
15.0000 mL | Freq: Once | INTRAVENOUS | Status: AC | PRN
  Administered 2015-08-07: 16 mL via INTRAVENOUS

## 2015-08-12 ENCOUNTER — Other Ambulatory Visit: Payer: Self-pay | Admitting: Oncology

## 2015-08-12 DIAGNOSIS — C50919 Malignant neoplasm of unspecified site of unspecified female breast: Secondary | ICD-10-CM

## 2015-08-13 ENCOUNTER — Telehealth: Payer: Self-pay

## 2015-08-13 ENCOUNTER — Telehealth: Payer: Self-pay | Admitting: Oncology

## 2015-08-13 NOTE — Telephone Encounter (Signed)
Spoke with patient per Dr. Jana Hakim- patient's MRI of the sternum shows normal breast bone and the lymph nodes are unchanged.  Will repeat MRI in a year.  Patient very happy and stated understanding.

## 2015-08-13 NOTE — Telephone Encounter (Signed)
Called and left a message with one years visit

## 2015-11-04 ENCOUNTER — Emergency Department (HOSPITAL_COMMUNITY)
Admission: EM | Admit: 2015-11-04 | Discharge: 2015-11-04 | Disposition: A | Discharge status: Home / Self Care | Payer: BC Managed Care – PPO | Source: Home / Self Care

## 2015-11-04 ENCOUNTER — Encounter (HOSPITAL_COMMUNITY): Payer: Self-pay | Admitting: *Deleted

## 2015-11-04 DIAGNOSIS — B009 Herpesviral infection, unspecified: Secondary | ICD-10-CM | POA: Diagnosis not present

## 2015-11-04 MED ORDER — ACYCLOVIR 5 % EX OINT: 1.0000 "application " | TOPICAL_OINTMENT | CUTANEOUS | Status: DC

## 2015-11-04 NOTE — ED Provider Notes (Signed)
CSN: [id number]     Arrival date & time 11/04/15  1434 History   None    Chief Complaint  Patient presents with  . Facial Swelling   (Consider location/radiation/quality/duration/timing/severity/associated sxs/prior Treatment) HPI Was seen at another UC just before Christmas and diagnosed with spider bite to the chin. Pt states that she developed a skin irritation one day and the next little blisters. Was started on Bactrim. Not better, now gums are swollen and it is sore to eat.  Past Medical History  Diagnosis Date  . Allergic rhinitis   . Breast cancer, BRCA1 positive ([place]) RIGHT INVASIVE DUCTAL CANCER---  S/P BILATERAL MASTECTOMY W/ RECONSTRUCTION  08-05-2012--  CHEMO ENEDED 06-22-2012    ONCOLOGIST- DR Jana Hakim; chemo   Past Surgical History  Procedure Laterality Date  . Inguinal hernia repair  AS INFANT  . Shoulder surgery  1996  &  1999    - labrum and capsular repair  . Portacath placement  03/10/2012    Procedure: INSERTION PORT-A-CATH;  Surgeon: Harl Bowie, MD;  Location: [facility];  Service: General;  Laterality: N/A;  . Mastectomy w/ sentinel node biopsy  08/05/2012    Procedure: MASTECTOMY WITH SENTINEL LYMPH NODE BIOPSY;  Surgeon: Harl Bowie, MD;  Location: [place];  Service: General;  Laterality: Right;   AND LEFT MASTECTOMY  . Port-a-cath removal  08/05/2012    Procedure: REMOVAL PORT-A-CATH;  Surgeon: Harl Bowie, MD;  Location: [place];  Service: General;  Laterality: N/A;  . Tissue expander placement  08/05/2012    Procedure: TISSUE EXPANDER;  Surgeon: Theodoro Kos, DO;  Location: [place];  Service: Plastics;  Laterality: Bilateral;  With Expanders and Flex HD Placement   . Breast reconstruction  08/05/2012    Procedure: BREAST RECONSTRUCTION;  Surgeon: Theodoro Kos, DO;  Location: [place];  Service: Plastics;  Laterality: Bilateral;  . Transthoracic echocardiogram  03-08-2012    NORMAL LVSF/ EF 60-65%   Family History  Problem Relation Age of Onset  .  Allergic rhinitis      grandmother  . Ovarian cancer Mother 64  . Cancer Mother     ovarian  . Cancer Paternal Uncle     leukemia  . Cancer Paternal Uncle     lung cancer   Social History  Substance Use Topics  . Smoking status: Never Smoker   . Smokeless tobacco: Never Used  . Alcohol Use: Yes     Comment: occasional   OB History    No data available     Review of Systems ROS +'ve chin lesion, swollen gums  Denies: HEADACHE, NAUSEA, ABDOMINAL PAIN, CHEST PAIN, CONGESTION, DYSURIA, SHORTNESS OF BREATH  Allergies  Dextromethorphan  Home Medications   Prior to Admission medications   Medication Sig Start Date End Date Taking? Authorizing Provider  mupirocin ointment (BACTROBAN) 2 % Place 1 application into the nose 2 (two) times daily.   Yes Historical Provider, MD  Sulfamethoxazole-Trimethoprim (SULFAMETHOXAZOLE-TMP DS PO) Take by mouth.   Yes Historical Provider, MD  acyclovir ointment (ZOVIRAX) 5 % Apply 1 application topically every 3 (three) hours. 11/04/15   Konrad Felix, PA  Chlorpheniramine-PSE-Ibuprofen (ADVIL ALLERGY SINUS PO) Take 1 tablet by mouth as needed. For allergies    Historical Provider, MD  EPINEPHrine (EPIPEN) 0.3 mg/0.3 mL DEVI Inject 0.3 mg into the muscle once.     Historical Provider, MD  Multiple Vitamin (MULTIVITAMIN WITH MINERALS) TABS Take 1 tablet by mouth daily.    Historical Provider, MD  Meds Ordered and Administered this Visit  Medications - No data to display  BP 132/92 mmHg  Pulse 76  Temp(Src) 99 F (37.2 C) (Oral)  Resp 16  SpO2 100%  LMP 10/07/2015 (Exact Date) No data found.   Physical Exam  Constitutional: She appears well-developed and well-nourished.  HENT:  Head: Normocephalic and atraumatic.      ED Course  Procedures (including critical care time)  Labs Review Labs Reviewed - No data to display  Imaging Review No results found.   Visual Acuity Review  Right Eye Distance:   Left Eye Distance:     Bilateral Distance:    Right Eye Near:   Left Eye Near:    Bilateral Near:         MDM   1. Herpes simplex infection    Acyclovir ointment, may stop antibx.  Follow up with your PCP.    Konrad Felix, PA 11/04/15 1623

## 2015-11-04 NOTE — Discharge Instructions (Signed)
Cold Sore Wash your hands well.  A cold sore (fever blister) is a skin infection caused by a certain type of germ (virus). They are small sores filled with fluid that dry up and heal within 2 weeks. Cold sores form inside of the mouth or on the lips, gums, and other parts of the body. Cold sores can be easily passed (contagious) to other people. This can happen through close personal contact, such as kissing or sharing a drinking glass. HOME CARE  Only take medicine as told by your doctor. Do not use aspirin.  Use a cotton-tip swab to put creams or gels on your sores.  Do not touch sores or pick scabs. Wash your hands often. Do not touch your eyes without washing your hands first.  Avoid kissing, oral sex, and sharing personal items until the sores heal.  Put an ice pack on your sores for 10-15 minutes to ease discomfort.  Avoid hot, cold, or salty foods. Eat a soft, bland diet. Use a straw to drink if it helps lessen pain.  Keep sores clean and dry.  Avoid the sun and limit stress if these things cause you to have sores. Apply sunscreen on your lips if the sun causes cold sores. GET HELP IF:  You have a fever or lasting symptoms for more than 2-3 days.  You have a fever and your symptoms suddenly get worse.  You have yellow-white fluid (not clear) coming from the sores.  You have redness that is spreading.  You have pain or irritation in your eye.  You get sores on your genitals.  Your sores do not heal within 2 weeks.  You have a tough time fighting off sickness and infections (weakened immune system).  You get cold sores often. MAKE SURE YOU:   Understand these instructions.  Will watch your condition.  Will get help right away if you are not doing well or get worse.   This information is not intended to replace advice given to you by your health care provider. Make sure you discuss any questions you have with your health care provider.   Document Released:  04/20/2012 Document Reviewed: 04/20/2012 Elsevier Interactive Patient Education [employer].

## 2015-11-04 NOTE — ED Notes (Signed)
Assessment per PA.  C/O lesion and swelling to chin x 8 days.

## 2015-11-29 ENCOUNTER — Other Ambulatory Visit: Payer: Self-pay | Admitting: Oncology

## 2016-03-21 IMAGING — MR MR BREAST BILATERAL W WO CONTRAST
6 of 13 series · 23 of 48 positions shown · IV contrast (15ml multihance)
Comparison: Previous exams including MRI 04/28/2012.

CLINICAL DATA: BRCA positive patient with personal history of right
breast cancer post bilateral mastectomy 8847. Current negative
ultrasound workup of questionable palpable area with pain/ swelling
upper outer left breast 1-2 weeks.

LABS:  None.
EXAM:
BILATERAL BREAST MRI WITH AND WITHOUT CONTRAST
TECHNIQUE: Multiplanar, multisequence MR images of both breasts were obtained
prior to and following the intravenous administration of 15ml of
MultiHance.

[Series 2: T2 · axial · 3.0mm · 0.47mm/px · z∈[-62,+106]mm · 3 of 57 slices shown]
[im 1/57]
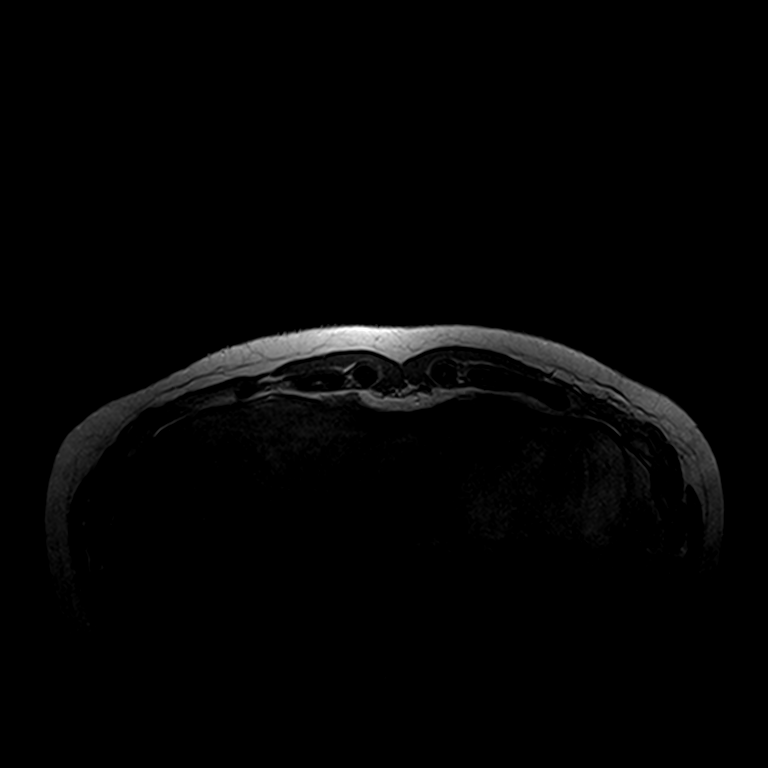
[im 29/57]
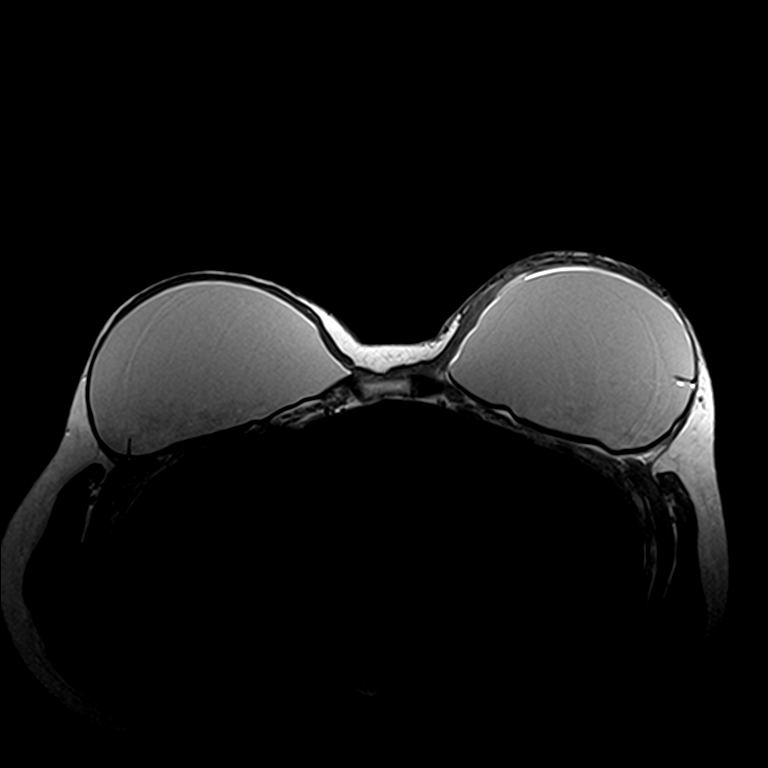
[im 57/57]
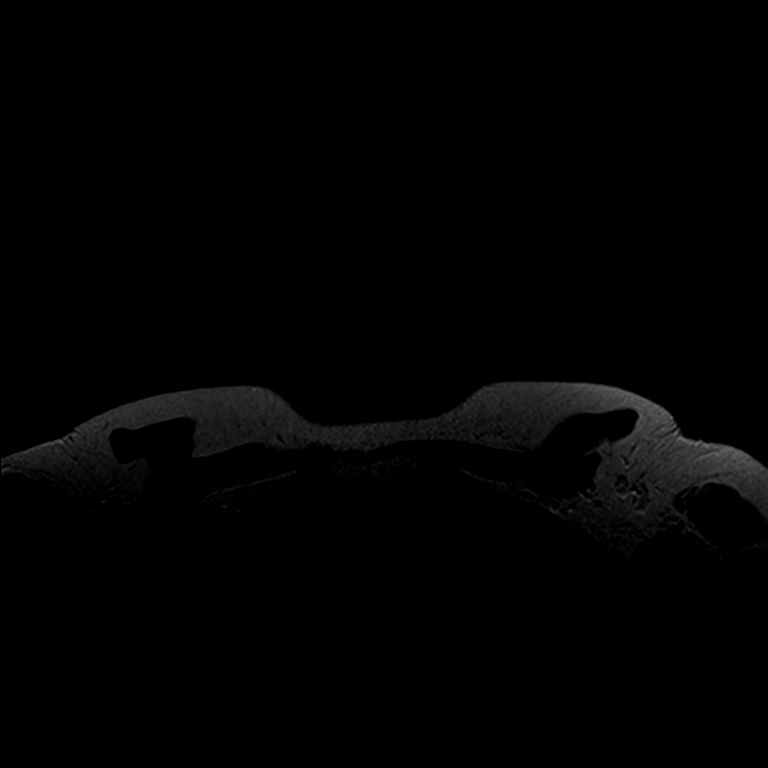

[Series 3: t2_tirm_tra ipat (a-p) · axial · 3.0mm · 0.70mm/px · z∈[-62,+106]mm · 2 of 57 slices shown]
[im 1/57]
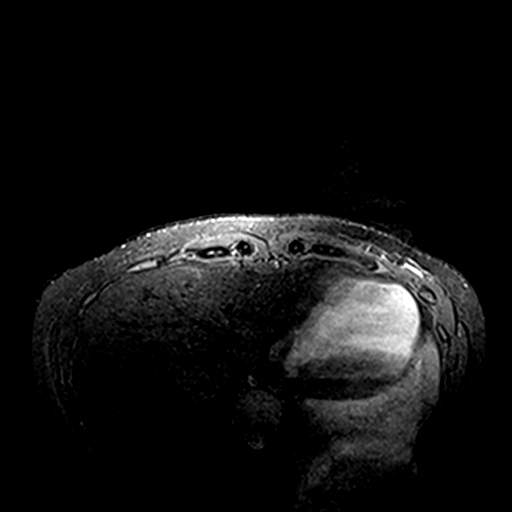
[im 57/57]
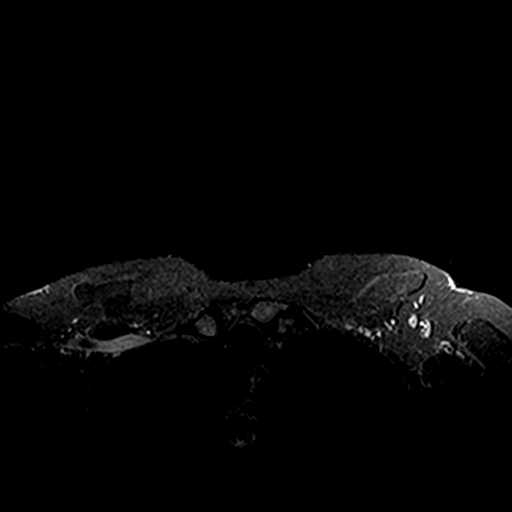

[Series 4: fl3d pre-cm no · axial · non-contrast · 1.2mm · 0.94mm/px · z∈[-64,+108]mm · 5 of 144 slices shown]
[im 1/144]
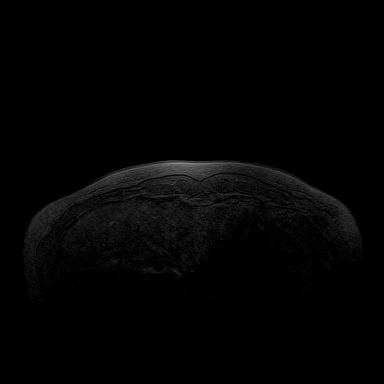
[im 36/144]
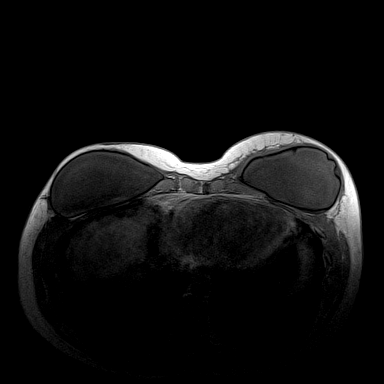
[im 72/144]
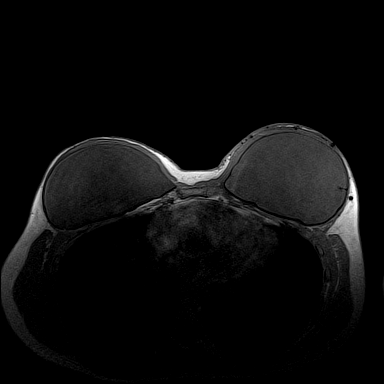
[im 108/144]
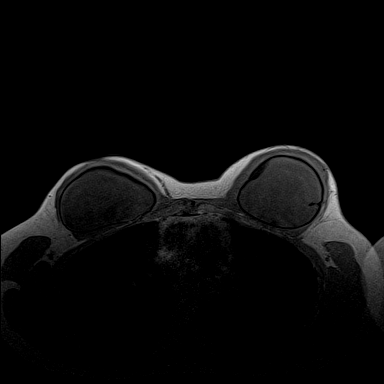
[im 144/144]
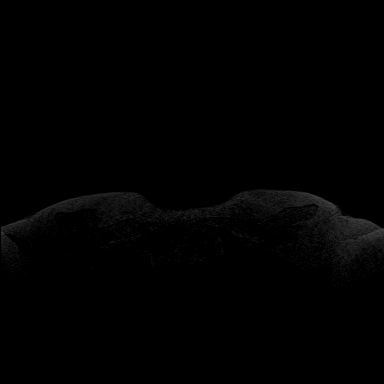

[Series 5: fl3d pre-cm · axial · non-contrast · 1.2mm · 0.94mm/px · z∈[-64,+108]mm · 5 of 144 slices shown]
[im 1/144]
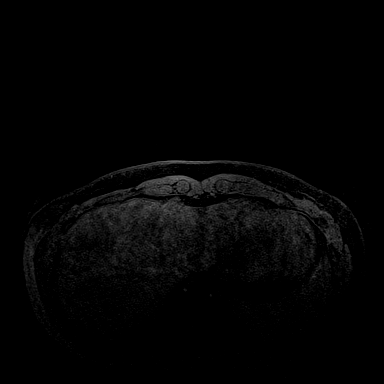
[im 36/144]
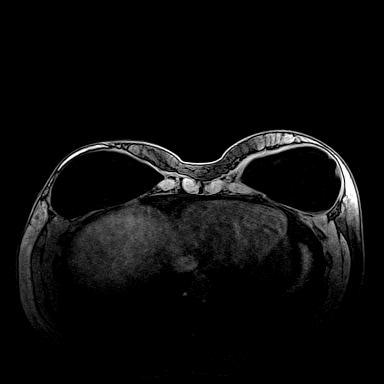
[im 72/144]
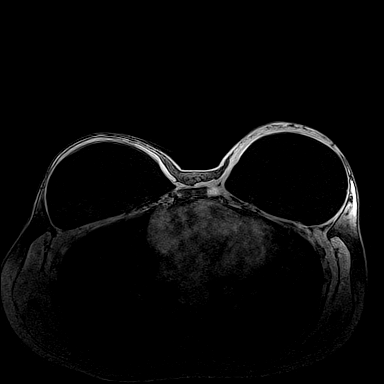
[im 108/144]
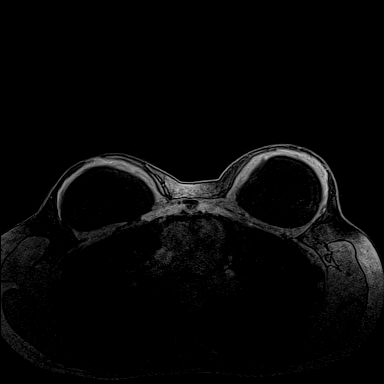
[im 144/144]
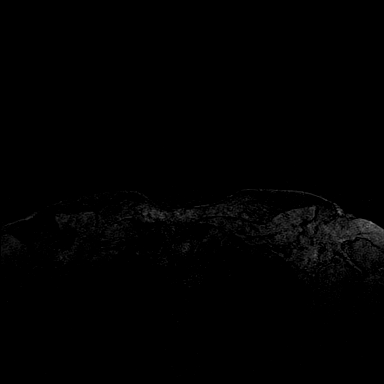

[Series 6: fl3d post immediate · axial · 1.2mm · 0.94mm/px · z∈[-64,+108]mm · 5 of 144 slices shown (1 of 2)]
[im 1/144]
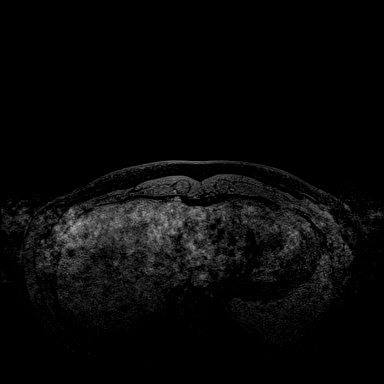
[im 36/144]
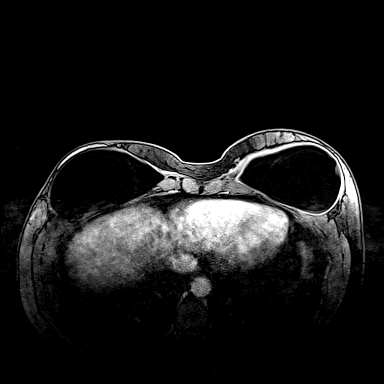
[im 72/144]
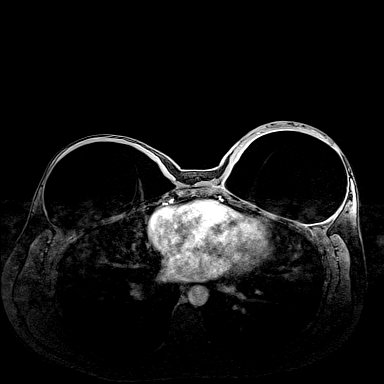
[im 108/144]
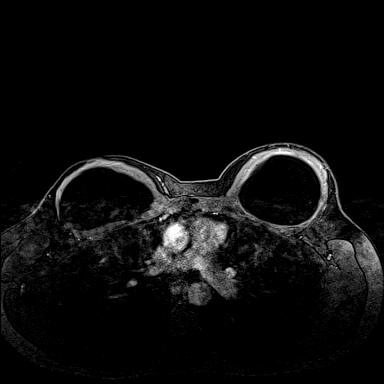
[im 144/144]
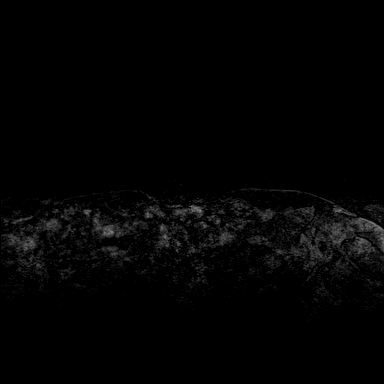

[Series 7: fl3d post immediate · axial · 1.2mm · 0.94mm/px · z∈[-64,+22]mm · 3 of 144 slices shown (2 of 2)]
[im 1/144]
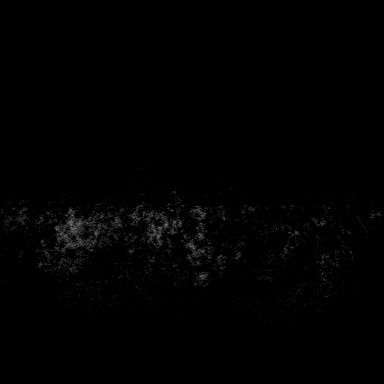
[im 36/144]
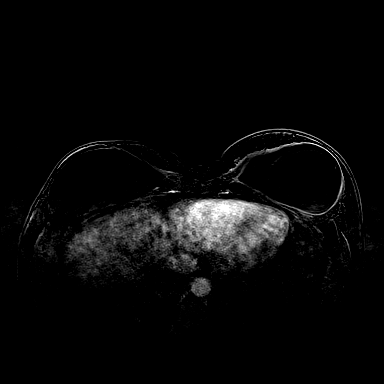
[im 72/144]
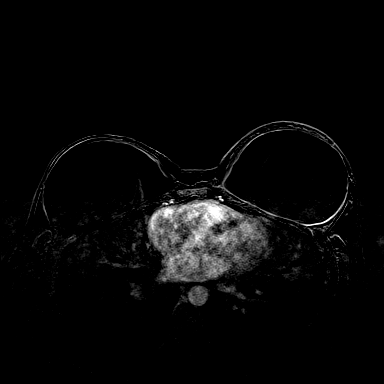

[23 of 48 positions shown; findings below may reference images not displayed]

THREE-DIMENSIONAL MR IMAGE RENDERING ON INDEPENDENT WORKSTATION:

Three-dimensional MR images were rendered by post-processing of the
original MR data on an independent workstation. The
three-dimensional MR images were interpreted, and findings are
reported in the following complete MRI report for this study. Three
dimensional images were evaluated at the independent [employer]
workstation
FINDINGS: Breast composition: a.  Almost entirely fat.

Background parenchymal enhancement: Minimal.

Right breast: Previous mastectomy with retropectoral saline implant
intact. No mass or abnormal enhancement.

Left breast: Previous mastectomy with retropectoral saline implant
intact. No mass or abnormal enhancement. Specifically no focal
abnormality over the upper outer left breast to correspond to
patient's questionable palpable area/pain/swelling.

Lymph nodes: There is interval enlargement of a 0.8 x 1.2 cm left
internal mammary lymph node just above the level of the
sternomanubrial junction. No abnormal appearing axillary lymph
nodes.

Ancillary findings:  None.
IMPRESSION: No focal abnormality over the upper outer left breast to correspond
to patient's questionable palpable area/pain/ swelling.

Suspicious interval enlargement of a 0.8 x 1.2 cm left internal
mammary lymph node just above the level of the sternomanubrial
junction.

RECOMMENDATION:
Recommend PET-CT for further evaluation. If negative PET-CT in this
area, would recommend followup MRI 6 months.

BI-RADS CATEGORY  0: Incomplete. Need additional imaging evaluation
and/or prior mammograms for comparison.

## 2016-07-22 ENCOUNTER — Other Ambulatory Visit: Payer: Self-pay | Admitting: Oncology

## 2016-07-22 DIAGNOSIS — C50919 Malignant neoplasm of unspecified site of unspecified female breast: Secondary | ICD-10-CM

## 2016-07-31 ENCOUNTER — Other Ambulatory Visit: Payer: Self-pay | Admitting: *Deleted

## 2016-07-31 DIAGNOSIS — C50511 Malignant neoplasm of lower-outer quadrant of right female breast: Secondary | ICD-10-CM

## 2016-08-04 ENCOUNTER — Other Ambulatory Visit (HOSPITAL_BASED_OUTPATIENT_CLINIC_OR_DEPARTMENT_OTHER): Payer: BC Managed Care – PPO

## 2016-08-04 DIAGNOSIS — C50511 Malignant neoplasm of lower-outer quadrant of right female breast: Secondary | ICD-10-CM | POA: Diagnosis not present

## 2016-08-04 LAB — COMPREHENSIVE METABOLIC PANEL
ALBUMIN: 4.1 g/dL (ref 3.5–5.0)
ALK PHOS: 61 U/L (ref 40–150)
ALT: 16 U/L (ref 0–55)
ANION GAP: 9 meq/L (ref 3–11)
AST: 19 U/L (ref 5–34)
BILIRUBIN TOTAL: 0.39 mg/dL (ref 0.20–1.20)
BUN: 12.7 mg/dL (ref 7.0–26.0)
CALCIUM: 9.3 mg/dL (ref 8.4–10.4)
CO2: 24 mEq/L (ref 22–29)
CREATININE: 0.8 mg/dL (ref 0.6–1.1)
Chloride: 105 mEq/L (ref 98–109)
Glucose: 85 mg/dl (ref 70–140)
Potassium: 3.7 mEq/L (ref 3.5–5.1)
Sodium: 138 mEq/L (ref 136–145)
TOTAL PROTEIN: 7.4 g/dL (ref 6.4–8.3)

## 2016-08-04 LAB — CBC WITH DIFFERENTIAL/PLATELET
BASO%: 0.2 % (ref 0.0–2.0)
Basophils Absolute: 0 10*3/uL (ref 0.0–0.1)
EOS ABS: 0.3 10*3/uL (ref 0.0–0.5)
EOS%: 5 % (ref 0.0–7.0)
HEMATOCRIT: 40.7 % (ref 34.8–46.6)
HGB: 13.3 g/dL (ref 11.6–15.9)
LYMPH#: 1.8 10*3/uL (ref 0.9–3.3)
LYMPH%: 31.9 % (ref 14.0–49.7)
MCH: 29 pg (ref 25.1–34.0)
MCHC: 32.7 g/dL (ref 31.5–36.0)
MCV: 88.9 fL (ref 79.5–101.0)
MONO#: 0.4 10*3/uL (ref 0.1–0.9)
MONO%: 6.1 % (ref 0.0–14.0)
NEUT%: 56.8 % (ref 38.4–76.8)
NEUTROS ABS: 3.3 10*3/uL (ref 1.5–6.5)
PLATELETS: 248 10*3/uL (ref 145–400)
RBC: 4.58 10*6/uL (ref 3.70–5.45)
RDW: 13.1 % (ref 11.2–14.5)
WBC: 5.8 10*3/uL (ref 3.9–10.3)

## 2016-08-11 ENCOUNTER — Ambulatory Visit (HOSPITAL_BASED_OUTPATIENT_CLINIC_OR_DEPARTMENT_OTHER): Payer: BC Managed Care – PPO | Admitting: Oncology

## 2016-08-11 VITALS — BP 118/80 | HR 63 | Temp 98.1°F | Resp 18 | Ht 70.0 in | Wt 189.9 lb

## 2016-08-11 DIAGNOSIS — Z171 Estrogen receptor negative status [ER-]: Secondary | ICD-10-CM | POA: Diagnosis not present

## 2016-08-11 DIAGNOSIS — C50511 Malignant neoplasm of lower-outer quadrant of right female breast: Secondary | ICD-10-CM

## 2016-08-11 NOTE — Progress Notes (Signed)
ID: [name]   DOB: Jan 02, 1976  MR#: [id number]  HMC#:[id number]  PCP: Leonides Sake, MD GYN: Dr. Molli Posey SU: Dr. Coralie Keens OTHER MD:  Allergist: Dr. Annamaria Boots  CHIEF COMPLAINTS:  1)  Hx of triple negative Breast Cancer      2) BRCA 1 Positive    HISTORY OF PRESENT ILLNESS:  From the original intake note:  Karis Rilling is a 40 year old woman from [facility], BRCA 1 positive, who presented with a painful, palpable mass in her right breast that she first noticed in January 2013. A bilateral mammogram and right breast ultrasound performed on 02/20/2012 showed an irregular hypoechoic mass at 8 o clock position measuring 2.1 x 1.1 x 1.6 cm.  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  MRI scan performed on 02/27/2012 showed this mass to be 2.2 x 1.6 x 1.9 cm. No other masses were seen.  She underwent 4 cycles of FEC treatment from 03/15/12 to 04/27/12 followed by 4 cycles of Taxotere from 05/11/12 to 06/22/12.  She underwent bilateral simple mastectomies with right axillary sentinel lymph node biopsy by Dr. Ninfa Linden on 08/05/12 with complete pathological response.    Subsequent history is as detailed below.   INTERVAL HISTORY:   Samuella returns today for follow-up of her BRCA positive breast cancer. Generally she is doing "fine". She exercises chiefly by walking 30 minutes but sometimes up to 3 miles most days a week.  She also follows up closely with Dr. Matthew Saras who is monitoring her for the increased risk of ovarian cancer given her genetic makeup  REVIEW OF SYSTEMS: Ita is worried about her weight. She does not keep to a strict diet however area she had a sore on her chin for which she was given antibiotics and antivirals. That has resolved. Aside from these issues a detailed review of systems today was benign.  PAST MEDICAL HISTORY: Past Medical  History:  Diagnosis Date  . Allergic rhinitis   . Breast cancer, BRCA1 positive ([place]) RIGHT INVASIVE DUCTAL CANCER---  S/P BILATERAL MASTECTOMY W/ RECONSTRUCTION  08-05-2012--  CHEMO ENEDED 06-22-2012   ONCOLOGIST- DR Jana Hakim; chemo    PAST SURGICAL HISTORY: Past Surgical History:  Procedure Laterality Date  . BREAST RECONSTRUCTION  08/05/2012   Procedure: BREAST RECONSTRUCTION;  Surgeon: Theodoro Kos, DO;  Location: [place];  Service: Plastics;  Laterality: Bilateral;  . INGUINAL HERNIA REPAIR  AS INFANT  . MASTECTOMY W/ SENTINEL NODE BIOPSY  08/05/2012   Procedure: MASTECTOMY WITH SENTINEL LYMPH NODE BIOPSY;  Surgeon: Harl Bowie, MD;  Location: [place];  Service: General;  Laterality: Right;   AND LEFT MASTECTOMY  . PORT-A-CATH REMOVAL  08/05/2012   Procedure: REMOVAL PORT-A-CATH;  Surgeon: Harl Bowie, MD;  Location: [place];  Service: General;  Laterality: N/A;  . PORTACATH PLACEMENT  03/10/2012   Procedure: INSERTION PORT-A-CATH;  Surgeon: Harl Bowie, MD;  [facility];  Service: General;  Laterality: N/A;  . [place]   - labrum and capsular repair  . TISSUE EXPANDER PLACEMENT  08/05/2012   Procedure: TISSUE EXPANDER;  Surgeon: Theodoro Kos, DO;  Location: [place];  Service: Plastics;  Laterality: Bilateral;  With Expanders and Flex HD Placement   . TRANSTHORACIC ECHOCARDIOGRAM  03-08-2012   NORMAL LVSF/ EF 60-65%    FAMILY HISTORY Family History  Problem Relation Age of  Onset  . Allergic rhinitis      grandmother  . Ovarian cancer Mother 56  . Cancer Mother     ovarian  . Cancer Paternal Uncle     leukemia  . Cancer Paternal Uncle     lung cancer    GYNECOLOGIC HISTORY:   (Updated 02/13/2014) Periods interrupted with chemotherapy, resumed May 2014, and normalized. LMP, 01/20/2014. She is not planning on having further children.  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 Divorced, has one son, [name], currently 53 y/o, interested in baseball particularly  HEALTH MAINTENANCE:  (Updated 02/13/2014) Social History  Substance Use Topics  . Smoking status: Never Smoker  . Smokeless tobacco: Never Used  . Alcohol use Yes     Comment: occasional    PAP:  2013 with Dr. Matthew Saras  Bone density: Never  Lipid panel:  Not on file  Colonoscopy: Never  Allergies  Allergen Reactions  . Dextromethorphan Rash    Current Outpatient Prescriptions  Medication Sig Dispense Refill  . Chlorpheniramine-PSE-Ibuprofen (ADVIL ALLERGY SINUS PO) Take 1 tablet by mouth as needed. For allergies    . EPINEPHrine (EPIPEN) 0.3 mg/0.3 mL DEVI Inject 0.3 mg into the muscle once.     . Multiple Vitamin (MULTIVITAMIN WITH MINERALS) TABS Take 1 tablet by mouth daily.     No current facility-administered medications for this visit.     OBJECTIVE: Young white Woman who appears well  Vitals:   08/11/16 1501  BP: 118/80  Pulse: 63  Resp: 18  Temp: 98.1 F (36.7 C)     Body mass index is 27.25 kg/m.    ECOG FS: 0 Filed Weights   08/11/16 1501  Weight: 189 lb 14.4 oz (86.1 kg)   Sclerae unicteric, EOMs intact Oropharynx clear and moist No cervical or supraclavicular adenopathy Lungs no rales or rhonchi Heart regular rate and rhythm Abd soft, nontender, positive bowel sounds MSK no focal spinal tenderness, no upper extremity lymphedema Neuro: nonfocal, well oriented, appropriate affect Breasts: Status post bilateral mastectomies with bilateral reconstruction. There is no evidence of local recurrence. Both axillae are benign.    LAB RESULTS: Lab Results  Component Value Date   WBC 5.8 08/04/2016   NEUTROABS 3.3 08/04/2016   HGB 13.3 08/04/2016   HCT 40.7 08/04/2016   MCV 88.9 08/04/2016   PLT 248 08/04/2016      Chemistry      Component Value Date/Time   NA 138 08/04/2016 1548   K 3.7 08/04/2016 1548   CL 104 12/20/2012 1006    CO2 24 08/04/2016 1548   BUN 12.7 08/04/2016 1548   CREATININE 0.8 08/04/2016 1548      Component Value Date/Time   CALCIUM 9.3 08/04/2016 1548   ALKPHOS 61 08/04/2016 1548   AST 19 08/04/2016 1548   ALT 16 08/04/2016 1548   BILITOT 0.39 08/04/2016 1548      STUDIES: No results found.  ASSESSMENT: 40 y.o. BRCA1 positive [IVS5-11T>G] [place] woman:  #1  S/p biopsy of the right breast lower outer quadrant on 02/20/12 showing a clinical T2, N0, M0, stage IIA  Invasive ductal carcinoma, grade 3, triple negative, with a Ki67 of 98%  #2  Completed 4 cycles of fluorouracil, epirubicin and cyclophosphamide from 03/15/12 to 04/27/12 followed by 4 cycles of docetaxel from 05/11/12 to 06/22/12.  #3  Bilateral simple mastectomies with right axillary sentinel lymph node biopsy on 08/05/12  showed a complete pathological response.  #4 BSO pending  PLAN:  Briony is now 4 years out from definitive surgery for her breast cancer, which is very favorable. The fact that she had a complete pathologic response at that time also bodes well.  She is closely followed by Dr. Matthew Saras with visits every 6 months and ultrasound once a year. I believe that will be repeated before the end of this year. She also sees Dr. Ninfa Linden regularly.  Jennalynn is considering proceeding with bilateral salpingo-oophorectomy in the near future.  She understands once she becomes postmenopausal. Will be even more difficult to control her weight and of course it will be other symptoms as well. She is going to consider going off carbohydrates for the next several months and see what that doesn't terms of her goals with weight control.  Accordingly she will return to see me in one year. We will again do lab work a few days before that so she can have all the results.  She knows to call for any problems that may develop before then.   Chauncey Cruel, MD     08/11/2016

## 2016-11-03 NOTE — L&D Delivery Note (Signed)
Delivery Note At 3:02 PM a viable female was delivered via Vaginal, Spontaneous Delivery (Presentation: LOA;  ).  APGAR: pending, ; weight  pending.   Placenta status: L&D , .  Cord: 3VC with the following complications:  nuchal x 1, delivered through.  Cord pH: sent  Anesthesia:   Episiotomy: None Lacerations: 1st degree Suture Repair: vicryl 4'0 Est. Blood Loss (mL):  200cc  It's a girl "[employer]"!! Mom to postpartum.  Baby to Couplet care / Skin to Skin.   Tyson Dense 07/29/2017, 3:17 PM

## 2016-11-07 ENCOUNTER — Other Ambulatory Visit: Payer: Self-pay | Admitting: Nurse Practitioner

## 2017-01-16 LAB — OB RESULTS CONSOLE RUBELLA ANTIBODY, IGM: Rubella: IMMUNE

## 2017-01-16 LAB — OB RESULTS CONSOLE ABO/RH: RH Type: POSITIVE

## 2017-01-16 LAB — OB RESULTS CONSOLE HIV ANTIBODY (ROUTINE TESTING): HIV: NONREACTIVE

## 2017-01-16 LAB — OB RESULTS CONSOLE HEPATITIS B SURFACE ANTIGEN: Hepatitis B Surface Ag: NEGATIVE

## 2017-01-16 LAB — OB RESULTS CONSOLE GC/CHLAMYDIA
CHLAMYDIA, DNA PROBE: NEGATIVE
GC PROBE AMP, GENITAL: NEGATIVE

## 2017-01-16 LAB — OB RESULTS CONSOLE ANTIBODY SCREEN: Antibody Screen: NEGATIVE

## 2017-01-16 LAB — OB RESULTS CONSOLE RPR: RPR: NONREACTIVE

## 2017-04-14 ENCOUNTER — Other Ambulatory Visit (HOSPITAL_COMMUNITY): Payer: Self-pay | Admitting: Obstetrics & Gynecology

## 2017-04-14 DIAGNOSIS — Z3689 Encounter for other specified antenatal screening: Secondary | ICD-10-CM

## 2017-04-27 ENCOUNTER — Ambulatory Visit (HOSPITAL_COMMUNITY)
Admission: RE | Admit: 2017-04-27 | Discharge: 2017-04-27 | Disposition: A | Discharge status: Home / Self Care | Payer: BC Managed Care – PPO | Source: Ambulatory Visit | Attending: Obstetrics & Gynecology | Admitting: Obstetrics & Gynecology

## 2017-04-27 ENCOUNTER — Encounter (HOSPITAL_COMMUNITY): Payer: Self-pay | Admitting: Radiology

## 2017-04-27 ENCOUNTER — Other Ambulatory Visit (HOSPITAL_COMMUNITY): Payer: Self-pay | Admitting: Obstetrics & Gynecology

## 2017-04-27 DIAGNOSIS — Z3A24 24 weeks gestation of pregnancy: Secondary | ICD-10-CM | POA: Insufficient documentation

## 2017-04-27 DIAGNOSIS — O09522 Supervision of elderly multigravida, second trimester: Secondary | ICD-10-CM | POA: Diagnosis not present

## 2017-04-27 DIAGNOSIS — O09892 Supervision of other high risk pregnancies, second trimester: Secondary | ICD-10-CM

## 2017-04-27 DIAGNOSIS — Z3689 Encounter for other specified antenatal screening: Secondary | ICD-10-CM | POA: Diagnosis not present

## 2017-04-27 DIAGNOSIS — Z141 Cystic fibrosis carrier: Secondary | ICD-10-CM | POA: Diagnosis not present

## 2017-07-28 ENCOUNTER — Inpatient Hospital Stay (HOSPITAL_COMMUNITY)
Admission: AD | Admit: 2017-07-28 | Discharge: 2017-07-31 | DRG: 775 | Disposition: A | Discharge status: Home / Self Care | Payer: BC Managed Care – PPO | Source: Ambulatory Visit | Attending: Obstetrics and Gynecology | Admitting: Obstetrics and Gynecology

## 2017-07-28 ENCOUNTER — Encounter (HOSPITAL_COMMUNITY): Payer: Self-pay | Admitting: *Deleted

## 2017-07-28 DIAGNOSIS — Z3A37 37 weeks gestation of pregnancy: Secondary | ICD-10-CM

## 2017-07-28 DIAGNOSIS — Z853 Personal history of malignant neoplasm of breast: Secondary | ICD-10-CM

## 2017-07-28 DIAGNOSIS — O1493 Unspecified pre-eclampsia, third trimester: Secondary | ICD-10-CM

## 2017-07-28 DIAGNOSIS — O99824 Streptococcus B carrier state complicating childbirth: Secondary | ICD-10-CM | POA: Diagnosis present

## 2017-07-28 DIAGNOSIS — R030 Elevated blood-pressure reading, without diagnosis of hypertension: Secondary | ICD-10-CM | POA: Diagnosis present

## 2017-07-28 DIAGNOSIS — O1494 Unspecified pre-eclampsia, complicating childbirth: Principal | ICD-10-CM | POA: Diagnosis present

## 2017-07-28 DIAGNOSIS — Z23 Encounter for immunization: Secondary | ICD-10-CM | POA: Diagnosis not present

## 2017-07-28 DIAGNOSIS — O1490 Unspecified pre-eclampsia, unspecified trimester: Secondary | ICD-10-CM | POA: Diagnosis present

## 2017-07-28 LAB — CBC
HEMATOCRIT: 38.8 % (ref 36.0–46.0)
Hemoglobin: 13.3 g/dL (ref 12.0–15.0)
MCH: 29.9 pg (ref 26.0–34.0)
MCHC: 34.3 g/dL (ref 30.0–36.0)
MCV: 87.2 fL (ref 78.0–100.0)
PLATELETS: 247 10*3/uL (ref 150–400)
RBC: 4.45 MIL/uL (ref 3.87–5.11)
RDW: 15.1 % (ref 11.5–15.5)
WBC: 8 10*3/uL (ref 4.0–10.5)

## 2017-07-28 LAB — COMPREHENSIVE METABOLIC PANEL
ALT: 14 U/L (ref 14–54)
ANION GAP: 12 (ref 5–15)
AST: 21 U/L (ref 15–41)
Albumin: 3.1 g/dL — ABNORMAL LOW (ref 3.5–5.0)
Alkaline Phosphatase: 95 U/L (ref 38–126)
BILIRUBIN TOTAL: 0.5 mg/dL (ref 0.3–1.2)
BUN: 11 mg/dL (ref 6–20)
CHLORIDE: 104 mmol/L (ref 101–111)
CO2: 20 mmol/L — ABNORMAL LOW (ref 22–32)
Calcium: 10 mg/dL (ref 8.9–10.3)
Creatinine, Ser: 0.67 mg/dL (ref 0.44–1.00)
Glucose, Bld: 77 mg/dL (ref 65–99)
POTASSIUM: 3.9 mmol/L (ref 3.5–5.1)
Sodium: 136 mmol/L (ref 135–145)
TOTAL PROTEIN: 6.7 g/dL (ref 6.5–8.1)

## 2017-07-28 LAB — TYPE AND SCREEN
ABO/RH(D): O POS
Antibody Screen: NEGATIVE

## 2017-07-28 LAB — URINALYSIS, ROUTINE W REFLEX MICROSCOPIC
Bilirubin Urine: NEGATIVE
Glucose, UA: NEGATIVE mg/dL
KETONES UR: NEGATIVE mg/dL
Nitrite: NEGATIVE
PH: 7 (ref 5.0–8.0)
PROTEIN: NEGATIVE mg/dL
Specific Gravity, Urine: 1.003 — ABNORMAL LOW (ref 1.005–1.030)

## 2017-07-28 LAB — ABO/RH: ABO/RH(D): O POS

## 2017-07-28 LAB — PROTEIN / CREATININE RATIO, URINE
CREATININE, URINE: 18 mg/dL
PROTEIN CREATININE RATIO: 0.67 mg/mg{creat} — AB (ref 0.00–0.15)
TOTAL PROTEIN, URINE: 12 mg/dL

## 2017-07-28 MED ORDER — LABETALOL HCL 5 MG/ML IV SOLN
20.0000 mg | INTRAVENOUS | Status: AC | PRN
Start: 2017-07-28 — End: 2017-07-29
  Administered 2017-07-28: 40 mg via INTRAVENOUS
  Administered 2017-07-28 – 2017-07-29 (×2): 20 mg via INTRAVENOUS
  Filled 2017-07-28 (×2): qty 4
  Filled 2017-07-28: qty 8

## 2017-07-28 MED ORDER — SOD CITRATE-CITRIC ACID 500-334 MG/5ML PO SOLN: 30.0000 mL | ORAL | Status: DC | PRN

## 2017-07-28 MED ORDER — OXYTOCIN BOLUS FROM INFUSION: 500.0000 mL | Freq: Once | INTRAVENOUS | Status: DC

## 2017-07-28 MED ORDER — OXYTOCIN 40 UNITS IN LACTATED RINGERS INFUSION - SIMPLE MED: 2.5000 [IU]/h | INTRAVENOUS | Status: DC

## 2017-07-28 MED ORDER — LACTATED RINGERS IV SOLN
INTRAVENOUS | Status: DC
  Administered 2017-07-28 – 2017-07-29 (×2): via INTRAVENOUS

## 2017-07-28 MED ORDER — ONDANSETRON HCL 4 MG/2ML IJ SOLN: 4.0000 mg | Freq: Four times a day (QID) | INTRAMUSCULAR | Status: DC | PRN

## 2017-07-28 MED ORDER — MAGNESIUM SULFATE BOLUS VIA INFUSION
4.0000 g | Freq: Once | INTRAVENOUS | Status: AC
  Administered 2017-07-28: 4 g via INTRAVENOUS
  Filled 2017-07-28: qty 500

## 2017-07-28 MED ORDER — LACTATED RINGERS IV SOLN: INTRAVENOUS | Status: DC

## 2017-07-28 MED ORDER — OXYTOCIN 40 UNITS IN LACTATED RINGERS INFUSION - SIMPLE MED
1.0000 m[IU]/min | INTRAVENOUS | Status: DC
  Administered 2017-07-28: 1 m[IU]/min via INTRAVENOUS
  Administered 2017-07-29: 6 m[IU]/min via INTRAVENOUS
  Filled 2017-07-28: qty 1000

## 2017-07-28 MED ORDER — HYDRALAZINE HCL 20 MG/ML IJ SOLN: 10.0000 mg | Freq: Once | INTRAMUSCULAR | Status: DC | PRN

## 2017-07-28 MED ORDER — ACETAMINOPHEN 325 MG PO TABS
650.0000 mg | ORAL_TABLET | ORAL | Status: DC | PRN
  Administered 2017-07-28 – 2017-07-29 (×3): 650 mg via ORAL
  Filled 2017-07-28 (×3): qty 2

## 2017-07-28 MED ORDER — OXYCODONE-ACETAMINOPHEN 5-325 MG PO TABS: 2.0000 | ORAL_TABLET | ORAL | Status: DC | PRN

## 2017-07-28 MED ORDER — OXYCODONE-ACETAMINOPHEN 5-325 MG PO TABS
2.0000 | ORAL_TABLET | ORAL | Status: DC | PRN
  Administered 2017-07-29: 2 via ORAL
  Filled 2017-07-28: qty 2

## 2017-07-28 MED ORDER — ACETAMINOPHEN 325 MG PO TABS: 650.0000 mg | ORAL_TABLET | ORAL | Status: DC | PRN

## 2017-07-28 MED ORDER — SODIUM CHLORIDE 0.9 % IV SOLN
INTRAVENOUS | Status: DC
  Administered 2017-07-28: 19:00:00 via INTRAVENOUS

## 2017-07-28 MED ORDER — OXYCODONE-ACETAMINOPHEN 5-325 MG PO TABS: 1.0000 | ORAL_TABLET | ORAL | Status: DC | PRN

## 2017-07-28 MED ORDER — LIDOCAINE HCL (PF) 1 % IJ SOLN: 30.0000 mL | INTRAMUSCULAR | Status: DC | PRN

## 2017-07-28 MED ORDER — PENICILLIN G POT IN DEXTROSE 60000 UNIT/ML IV SOLN
3.0000 10*6.[IU] | INTRAVENOUS | Status: DC
  Administered 2017-07-29 (×3): 3 10*6.[IU] via INTRAVENOUS
  Filled 2017-07-28 (×7): qty 50

## 2017-07-28 MED ORDER — FLEET ENEMA 7-19 GM/118ML RE ENEM: 1.0000 | ENEMA | RECTAL | Status: DC | PRN

## 2017-07-28 MED ORDER — LACTATED RINGERS IV SOLN: 500.0000 mL | INTRAVENOUS | Status: DC | PRN

## 2017-07-28 MED ORDER — MAGNESIUM SULFATE 40 G IN LACTATED RINGERS - SIMPLE
2.0000 g/h | INTRAVENOUS | Status: DC
  Administered 2017-07-30: 2 g/h via INTRAVENOUS
  Filled 2017-07-28 (×2): qty 500
  Filled 2017-07-28: qty 40

## 2017-07-28 MED ORDER — TERBUTALINE SULFATE 1 MG/ML IJ SOLN: 0.2500 mg | Freq: Once | INTRAMUSCULAR | Status: DC | PRN

## 2017-07-28 MED ORDER — PENICILLIN G POT IN DEXTROSE 60000 UNIT/ML IV SOLN
3.0000 10*6.[IU] | INTRAVENOUS | Status: DC
  Filled 2017-07-28 (×2): qty 50

## 2017-07-28 MED ORDER — PENICILLIN G POTASSIUM 5000000 UNITS IJ SOLR
5.0000 10*6.[IU] | Freq: Once | INTRAVENOUS | Status: AC
  Administered 2017-07-28: 5 10*6.[IU] via INTRAVENOUS
  Filled 2017-07-28: qty 5

## 2017-07-28 NOTE — MAU Provider Note (Signed)
Chief Complaint:  Hypertension   First Provider Initiated Contact with Patient 07/28/17 1728     HPI: Betty [name] is a 41 y.o. G1P0 at 34w3dho presents to maternity admissions reporting hypertension noted in office today. She reports good fetal movement, denies LOF, vaginal bleeding, vaginal itching/burning, urinary symptoms, h/a, dizziness, n/v, diarrhea, constipation or fever/chills.  She denies headache, visual changes or RUQ abdominal pain.  Hypertension  This is a new problem. The current episode started in the past 7 days. The problem is unchanged. Associated symptoms include peripheral edema. Pertinent negatives include no anxiety, blurred vision, chest pain, headaches, palpitations or shortness of breath. There are no associated agents to hypertension. There are no known risk factors for coronary artery disease. Past treatments include nothing. There are no compliance problems.     [facility] 178/100 in offive Denies headache, epigastric pain, no increase in swelling, changes in vision.   Past Medical History: Past Medical History:  Diagnosis Date  . Allergic rhinitis   . Breast cancer, [place]1 [place] ([place] RIGHT INVASIVE DUCTAL CANCER---  S/P BILATERAL MASTECTOMY W/ RECONSTRUCTION  08-05-2012--  CHEMO ENEDED 06-22-2012   ONCOLOGIST- DR MJana Hakim chemo    Past obstetric history: OB History  Gravida Para Term Preterm AB Living  1            SAB TAB Ectopic Multiple Live Births               # Outcome Date GA Lbr Len/2nd Weight Sex Delivery Anes PTL Lv  1 Current               Past Surgical History: Past Surgical History:  Procedure Laterality Date  . BREAST RECONSTRUCTION  08/05/2012   Procedure: BREAST RECONSTRUCTION;  Surgeon: CTheodoro Kos DO;  [place]  Service: Plastics;  Laterality: Bilateral;  . INGUINAL HERNIA REPAIR  AS INFANT  . MASTECTOMY W/ SENTINEL NODE BIOPSY  08/05/2012   Procedure: MASTECTOMY  WITH SENTINEL LYMPH NODE BIOPSY;  Surgeon: DHarl Bowie MD;  [place]  Service: General;  Laterality: Right;   AND LEFT MASTECTOMY  . PORT-A-CATH REMOVAL  08/05/2012   Procedure: REMOVAL PORT-A-CATH;  Surgeon: DHarl Bowie MD;  [place]  Service: General;  Laterality: N/A;  . PORTACATH PLACEMENT  03/10/2012   Procedure: INSERTION PORT-A-CATH;  Surgeon: DHarl Bowie MD;  [facility];  Service: General;  Laterality: N/A;  . [place]  - labrum and capsular repair  . TISSUE EXPANDER PLACEMENT  08/05/2012   Procedure: TISSUE EXPANDER;  Surgeon: CTheodoro Kos DO;  [place]  Service: Plastics;  Laterality: Bilateral;  With Expanders and Flex HD Placement   . TRANSTHORACIC ECHOCARDIOGRAM  03-08-2012   NORMAL LVSF/ EF 60-65%    Family History: Family History  Problem Relation Age of Onset  . Allergic rhinitis Unknown        grandmother  . Ovarian cancer Mother 43 . Cancer Mother        ovarian  . Cancer Paternal Uncle        leukemia  . Cancer Paternal Uncle        lung cancer    Social History: Social History  Substance Use Topics  . Smoking status: Never Smoker  . Smokeless tobacco: Never Used  . Alcohol use Yes     Comment: occasional    Allergies:  Allergies  Allergen Reactions  . Dextromethorphan Rash    Meds:  Prescriptions Prior to Admission  Medication Sig Dispense Refill Last Dose  . Chlorpheniramine-PSE-Ibuprofen (ADVIL ALLERGY SINUS PO) Take 1 tablet by mouth as needed. For allergies   Taking  . EPINEPHrine (EPIPEN) 0.3 mg/0.3 mL DEVI Inject 0.3 mg into the muscle once.    Taking  . Multiple Vitamin (MULTIVITAMIN WITH MINERALS) TABS Take 1 tablet by mouth daily.   Taking    I have reviewed patient's Past Medical Hx, Surgical Hx, Family Hx, Social Hx, medications and allergies.   ROS:  Review of Systems  Eyes: Negative for blurred vision.  Respiratory: Negative for shortness of breath.    Cardiovascular: Negative for chest pain and palpitations.  Neurological: Negative for headaches.   Other systems negative  Physical Exam  Patient Vitals for the past 24 hrs:  BP Temp Temp src Pulse Resp SpO2 Weight  07/28/17 1719 (!) 179/103 98.1 F (36.7 C) Oral 72 18 97 % 224 lb 0.6 oz (101.6 kg)   Vitals:   07/28/17 1719 07/28/17 1730 07/28/17 1754  BP: (!) 179/103 (!) 181/106 (!) 167/97  Pulse: 72 79 70  Resp: 18    Temp: 98.1 F (36.7 C)    TempSrc: Oral    SpO2: 97%    Weight: 224 lb 0.6 oz (101.6 kg)      Constitutional: Well-developed, well-nourished female in no acute distress.  Cardiovascular: normal rate and rhythm Respiratory: normal effort, clear to auscultation bilaterally GI: Abd soft, non-tender, gravid appropriate for gestational age.   No rebound or guarding. MS: Extremities nontender, no edema, normal ROM Neurologic: Alert and oriented x 4.  GU: Neg CVAT.  PELVIC EXAM: deferred   FHT:  Baseline 130 , moderate variability, accelerations present, no decelerations Contractions: Irregular    Labs: Results for orders placed or performed during the hospital encounter of 07/28/17 (from the past 24 hour(s))  Urinalysis, Routine w reflex microscopic     Status: Abnormal   Collection Time: 07/28/17  5:21 PM  Result Value Ref Range   Color, Urine YELLOW YELLOW   APPearance CLOUDY (A) CLEAR   Specific Gravity, Urine 1.003 (L) 1.005 - 1.030   pH 7.0 5.0 - 8.0   Glucose, UA NEGATIVE NEGATIVE mg/dL   Hgb urine dipstick MODERATE (A) NEGATIVE   Bilirubin Urine NEGATIVE NEGATIVE   Ketones, ur NEGATIVE NEGATIVE mg/dL   Protein, ur NEGATIVE NEGATIVE mg/dL   Nitrite NEGATIVE NEGATIVE   Leukocytes, UA LARGE (A) NEGATIVE   RBC / HPF 0-5 0 - 5 RBC/hpf   WBC, UA 6-30 0 - 5 WBC/hpf   Bacteria, UA MANY (A) NONE SEEN   Squamous Epithelial / LPF 6-30 (A) NONE SEEN   Amorphous Crystal PRESENT    Non Squamous Epithelial 0-5 (A) NONE SEEN  Protein / creatinine ratio,  urine     Status: Abnormal   Collection Time: 07/28/17  5:21 PM  Result Value Ref Range   Creatinine, Urine 18.00 mg/dL   Total Protein, Urine 12 mg/dL   Protein Creatinine Ratio 0.67 (H) 0.00 - 0.15 mg/mg[Cre]  CBC     Status: None   Collection Time: 07/28/17  6:12 PM  Result Value Ref Range   WBC 8.0 4.0 - 10.5 K/uL   RBC 4.45 3.87 - 5.11 MIL/uL   Hemoglobin 13.3 12.0 - 15.0 g/dL   HCT 38.8 36.0 - 46.0 %   MCV 87.2 78.0 - 100.0 fL   MCH 29.9 26.0 - 34.0  pg   MCHC 34.3 30.0 - 36.0 g/dL   RDW 15.1 11.5 - 15.5 %   Platelets 247 150 - 400 K/uL  Comprehensive metabolic panel     Status: Abnormal   Collection Time: 07/28/17  6:12 PM  Result Value Ref Range   Sodium 136 135 - 145 mmol/L   Potassium 3.9 3.5 - 5.1 mmol/L   Chloride 104 101 - 111 mmol/L   CO2 20 (L) 22 - 32 mmol/L   Glucose, Bld 77 65 - 99 mg/dL   BUN 11 6 - 20 mg/dL   Creatinine, Ser 0.67 0.44 - 1.00 mg/dL   Calcium 10.0 8.9 - 10.3 mg/dL   Total Protein 6.7 6.5 - 8.1 g/dL   Albumin 3.1 (L) 3.5 - 5.0 g/dL   AST 21 15 - 41 U/L   ALT 14 14 - 54 U/L   Alkaline Phosphatase 95 38 - 126 U/L   Total Bilirubin 0.5 0.3 - 1.2 mg/dL   GFR calc non Af Amer >60 >60 mL/min   GFR calc Af Amer >60 >60 mL/min   Anion gap 12 5 - 15       Imaging:  No results found.  MAU Course/MDM: I have ordered labs and reviewed results.  NST reviewed and found to be reactive, Category I  Consult Dr Matthew Saras with presentation, exam findings and test results.   Treatments in MAU included IV, Hypertension protocol IV stick was quite difficult, resulting in delay of protocol.    BPs after Labetalol 39m at 1832 160/94     155/86         145/92  Assessment: Single IUP at 376w3dreeclampsia with severe features Difficult IV access GBS positive   [facility] HoMatthew Sarasoutine labor orders with Penicillin Induction of labor to be determined by Dr HoMatthew SarasMaHansel FeinsteinNM, MSN Certified  Nurse-Midwife 07/28/2017 5:28 PM

## 2017-07-28 NOTE — H&P (Signed)
Betty [name] is a 41 y.o. female presenting for seen in office w/ incr BP>>in MAU had 2 sep readings in severe range , requiring IV meds, adm for labor induction/MgSO4. OB History    Gravida Para Term Preterm AB Living   _0 SAB TAB Ectopic Multiple Live Births           1     Past Medical History:  Diagnosis Date  . Allergic rhinitis   . Breast cancer, BRCA1 positive ([place]) RIGHT INVASIVE DUCTAL CANCER---  S/P BILATERAL MASTECTOMY W/ RECONSTRUCTION  08-05-2012--  CHEMO ENEDED 06-22-2012   ONCOLOGIST- DR Jana Hakim; chemo   Past Surgical History:  Procedure Laterality Date  . BREAST RECONSTRUCTION  08/05/2012   Procedure: BREAST RECONSTRUCTION;  Surgeon: Theodoro Kos, DO;  Location: [place];  Service: Plastics;  Laterality: Bilateral;  . INGUINAL HERNIA REPAIR  AS INFANT  . MASTECTOMY W/ SENTINEL NODE BIOPSY  08/05/2012   Procedure: MASTECTOMY WITH SENTINEL LYMPH NODE BIOPSY;  Surgeon: Harl Bowie, MD;  Location: [place];  Service: General;  Laterality: Right;   AND LEFT MASTECTOMY  . PORT-A-CATH REMOVAL  08/05/2012   Procedure: REMOVAL PORT-A-CATH;  Surgeon: Harl Bowie, MD;  Location: [place];  Service: General;  Laterality: N/A;  . PORTACATH PLACEMENT  03/10/2012   Procedure: INSERTION PORT-A-CATH;  Surgeon: Harl Bowie, MD;  [facility];  Service: General;  Laterality: N/A;  . [place]   - labrum and capsular repair  . TISSUE EXPANDER PLACEMENT  08/05/2012   Procedure: TISSUE EXPANDER;  Surgeon: Theodoro Kos, DO;  Location: [place];  Service: Plastics;  Laterality: Bilateral;  With Expanders and Flex HD Placement   . TRANSTHORACIC ECHOCARDIOGRAM  03-08-2012   NORMAL LVSF/ EF 60-65%   Family History: family history includes Allergic rhinitis in her unknown relative; Cancer in her mother, paternal uncle, and paternal uncle; Ovarian cancer (age of onset: 17) in her mother. Social History:  reports that she has never smoked. She has  never used smokeless tobacco. She reports that she does not drink alcohol or use drugs.     Maternal Diabetes: No Genetic Screening: Normal Maternal Ultrasounds/Referrals: Normal Fetal Ultrasounds or other Referrals:  None Maternal Substance Abuse:  No Significant Maternal Medications:  None Significant Maternal Lab Results:  None Other Comments:  None  ROS History   Blood pressure (!) 145/92, pulse 72, temperature 98.1 F (36.7 C), temperature source Oral, resp. rate 18, weight 224 lb 0.6 oz (101.6 kg), SpO2 97 %. Exam Physical Exam  Constitutional: She is oriented to person, place, and time. She appears well-developed and well-nourished.  HENT:  Head: Normocephalic and atraumatic.  Neck: Normal range of motion. Neck supple.  Cardiovascular: Normal rate and regular rhythm.   Respiratory: Effort normal and breath sounds normal.  GI:  Term FH, FHR 142  Genitourinary:  Genitourinary Comments: cx 1+/50/vtx  Musculoskeletal: Normal range of motion.  Neurological: She is alert and oriented to person, place, and time.    Prenatal labs: ABO, Rh:   Antibody:   Rubella:   RPR:    HBsAg:    HIV:    GBS:     Assessment/Plan: 51w3dPre-E will adm for IOL/MgSO4   [facility] 07/28/2017, 7:35 PM

## 2017-07-28 NOTE — MAU Note (Signed)
Sent from office because of high blood pressures.  [redacted]w[redacted]d

## 2017-07-28 NOTE — MAU Note (Signed)
Sent over from [facility] for evaluation of Bp --states 178/100 in offive Denies headache, epigastric pain, no increase in swelling, changes in vision.

## 2017-07-28 NOTE — Progress Notes (Signed)
1st dose of antihypertensive medication given 73 minutes after noting severe range blood pressure because IV access was difficult to obtain.  4th RN able to gain access and labetalol given immediately.

## 2017-07-29 ENCOUNTER — Inpatient Hospital Stay (HOSPITAL_COMMUNITY): Payer: BC Managed Care – PPO | Admitting: Anesthesiology

## 2017-07-29 ENCOUNTER — Encounter (HOSPITAL_COMMUNITY): Payer: Self-pay | Admitting: *Deleted

## 2017-07-29 LAB — CBC
HCT: 37.8 % (ref 36.0–46.0)
HCT: 39 % (ref 36.0–46.0)
Hemoglobin: 12.5 g/dL (ref 12.0–15.0)
Hemoglobin: 13.2 g/dL (ref 12.0–15.0)
MCH: 29 pg (ref 26.0–34.0)
MCH: 29.5 pg (ref 26.0–34.0)
MCHC: 33.1 g/dL (ref 30.0–36.0)
MCHC: 33.8 g/dL (ref 30.0–36.0)
MCV: 87.7 fL (ref 78.0–100.0)
MCV: 87.1 fL (ref 78.0–100.0)
PLATELETS: 231 10*3/uL (ref 150–400)
PLATELETS: 226 10*3/uL (ref 150–400)
RBC: 4.31 MIL/uL (ref 3.87–5.11)
RBC: 4.48 MIL/uL (ref 3.87–5.11)
RDW: 15.2 % (ref 11.5–15.5)
RDW: 15.2 % (ref 11.5–15.5)
WBC: 9.9 10*3/uL (ref 4.0–10.5)
WBC: 13 10*3/uL — ABNORMAL HIGH (ref 4.0–10.5)

## 2017-07-29 LAB — RPR: RPR: NONREACTIVE

## 2017-07-29 MED ORDER — TETANUS-DIPHTH-ACELL PERTUSSIS 5-2.5-18.5 LF-MCG/0.5 IM SUSP
0.5000 mL | Freq: Once | INTRAMUSCULAR | Status: DC
  Filled 2017-07-29: qty 0.5

## 2017-07-29 MED ORDER — DIPHENHYDRAMINE HCL 25 MG PO CAPS: 25.0000 mg | ORAL_CAPSULE | Freq: Four times a day (QID) | ORAL | Status: DC | PRN

## 2017-07-29 MED ORDER — PHENYLEPHRINE 40 MCG/ML (10ML) SYRINGE FOR IV PUSH (FOR BLOOD PRESSURE SUPPORT)
80.0000 ug | PREFILLED_SYRINGE | INTRAVENOUS | Status: DC | PRN
  Filled 2017-07-29: qty 10

## 2017-07-29 MED ORDER — DIBUCAINE 1 % RE OINT: 1.0000 "application " | TOPICAL_OINTMENT | RECTAL | Status: DC | PRN

## 2017-07-29 MED ORDER — EPHEDRINE 5 MG/ML INJ: 10.0000 mg | INTRAVENOUS | Status: DC | PRN

## 2017-07-29 MED ORDER — ACETAMINOPHEN 325 MG PO TABS: 650.0000 mg | ORAL_TABLET | ORAL | Status: DC | PRN

## 2017-07-29 MED ORDER — BENZOCAINE-MENTHOL 20-0.5 % EX AERO
1.0000 "application " | INHALATION_SPRAY | CUTANEOUS | Status: DC | PRN
  Administered 2017-07-29: 1 via TOPICAL
  Filled 2017-07-29: qty 56

## 2017-07-29 MED ORDER — SENNOSIDES-DOCUSATE SODIUM 8.6-50 MG PO TABS
2.0000 | ORAL_TABLET | ORAL | Status: DC
  Administered 2017-07-30 – 2017-07-31 (×2): 2 via ORAL
  Filled 2017-07-29 (×2): qty 2

## 2017-07-29 MED ORDER — PRENATAL MULTIVITAMIN CH
1.0000 | ORAL_TABLET | Freq: Every day | ORAL | Status: DC
  Administered 2017-07-30: 1 via ORAL
  Filled 2017-07-29: qty 1

## 2017-07-29 MED ORDER — OXYCODONE HCL 5 MG PO TABS: 5.0000 mg | ORAL_TABLET | ORAL | Status: DC | PRN

## 2017-07-29 MED ORDER — FENTANYL 2.5 MCG/ML BUPIVACAINE 1/10 % EPIDURAL INFUSION (WH - ANES)
14.0000 mL/h | INTRAMUSCULAR | Status: DC | PRN
  Administered 2017-07-29: 14 mL/h via EPIDURAL
  Filled 2017-07-29: qty 100

## 2017-07-29 MED ORDER — DIPHENHYDRAMINE HCL 50 MG/ML IJ SOLN: 12.5000 mg | INTRAMUSCULAR | Status: DC | PRN

## 2017-07-29 MED ORDER — LACTATED RINGERS IV SOLN: 500.0000 mL | Freq: Once | INTRAVENOUS | Status: DC

## 2017-07-29 MED ORDER — SIMETHICONE 80 MG PO CHEW: 80.0000 mg | CHEWABLE_TABLET | ORAL | Status: DC | PRN

## 2017-07-29 MED ORDER — PHENYLEPHRINE 40 MCG/ML (10ML) SYRINGE FOR IV PUSH (FOR BLOOD PRESSURE SUPPORT): 80.0000 ug | PREFILLED_SYRINGE | INTRAVENOUS | Status: DC | PRN

## 2017-07-29 MED ORDER — LIDOCAINE HCL (PF) 1 % IJ SOLN
INTRAMUSCULAR | Status: DC | PRN
  Administered 2017-07-29 (×2): 5 mL via EPIDURAL

## 2017-07-29 MED ORDER — OXYCODONE HCL 5 MG PO TABS: 10.0000 mg | ORAL_TABLET | ORAL | Status: DC | PRN

## 2017-07-29 MED ORDER — IBUPROFEN 600 MG PO TABS
600.0000 mg | ORAL_TABLET | Freq: Four times a day (QID) | ORAL | Status: DC
  Administered 2017-07-29 – 2017-07-31 (×7): 600 mg via ORAL
  Filled 2017-07-29 (×7): qty 1

## 2017-07-29 MED ORDER — ONDANSETRON HCL 4 MG PO TABS
4.0000 mg | ORAL_TABLET | ORAL | Status: DC | PRN
Start: 2017-07-29 — End: 2017-07-31

## 2017-07-29 MED ORDER — INFLUENZA VAC SPLIT QUAD 0.5 ML IM SUSY
0.5000 mL | PREFILLED_SYRINGE | INTRAMUSCULAR | Status: AC
  Administered 2017-07-31: 0.5 mL via INTRAMUSCULAR
  Filled 2017-07-29: qty 0.5

## 2017-07-29 MED ORDER — ZOLPIDEM TARTRATE 5 MG PO TABS: 5.0000 mg | ORAL_TABLET | Freq: Every evening | ORAL | Status: DC | PRN

## 2017-07-29 MED ORDER — COCONUT OIL OIL: 1.0000 "application " | TOPICAL_OIL | Status: DC | PRN

## 2017-07-29 MED ORDER — ONDANSETRON HCL 4 MG/2ML IJ SOLN: 4.0000 mg | INTRAMUSCULAR | Status: DC | PRN

## 2017-07-29 MED ORDER — WITCH HAZEL-GLYCERIN EX PADS: 1.0000 "application " | MEDICATED_PAD | CUTANEOUS | Status: DC | PRN

## 2017-07-29 NOTE — Anesthesia Pain Management Evaluation Note (Signed)
  CRNA Pain Management Visit Note  Patient: Betty [name], 41 y.o., female  "Hello I am a member of the anesthesia team at [facility]. We have an anesthesia team available at all times to provide care throughout the hospital, including epidural management and anesthesia for C-section. I don't know your plan for the delivery whether it a natural birth, water birth, IV sedation, nitrous supplementation, doula or epidural, but we want to meet your pain goals."   1.Was your pain managed to your expectations on prior hospitalizations?   Yes   2.What is your expectation for pain management during this hospitalization?     Epidural  3.How can we help you reach that goal? epidural  Record the patient's initial score and the patient's pain goal.   Pain: 5  Pain Goal: 4 [facility] wants you to be able to say your pain was always managed very well.  [name] 07/29/2017

## 2017-07-29 NOTE — Anesthesia Procedure Notes (Signed)
Epidural Patient location during procedure: OB Start time: 07/29/2017 8:05 AM End time: 07/29/2017 8:13 AM  Staffing Anesthesiologist: Hayla Hinger  Preanesthetic Checklist Completed: patient identified, site marked, surgical consent, pre-op evaluation, timeout performed, IV checked, risks and benefits discussed and monitors and equipment checked  Epidural Patient position: sitting Prep: site prepped and draped and DuraPrep Patient monitoring: continuous pulse ox and blood pressure Approach: midline Location: L4-L5 Injection technique: LOR air  Needle:  Needle type: Tuohy  Needle gauge: 17 G Needle length: 9 cm and 9 Needle insertion depth: 6 cm Catheter type: closed end flexible Catheter size: 19 Gauge Catheter at skin depth: 11 cm Test dose: negative  Assessment Events: blood not aspirated, injection not painful, no injection resistance, negative IV test and no paresthesia

## 2017-07-29 NOTE — Anesthesia Postprocedure Evaluation (Signed)
Anesthesia Post Note  Patient: Betty [name]  Procedure(s) Performed: * No procedures listed *     Patient location during evaluation: Mother Baby Anesthesia Type: Epidural Level of consciousness: awake and alert Pain management: pain level controlled Vital Signs Assessment: post-procedure vital signs reviewed and stable Respiratory status: spontaneous breathing, nonlabored ventilation and respiratory function stable Cardiovascular status: stable Postop Assessment: no headache, no backache and epidural receding Anesthetic complications: no    Last Vitals:  Vitals:   07/29/17 1700 07/29/17 1740  BP: 135/80 (!) 154/79  Pulse: 70 65  Resp:  18  Temp:  37.2 C  SpO2:  99%    Last Pain:  Vitals:   07/29/17 1740  TempSrc: Oral  PainSc: 0-No pain   Pain Goal:                 [name]

## 2017-07-29 NOTE — Progress Notes (Signed)
S:  Feeling well. Currently denies Ha, blurry vision, Cp, SOB and epigastric/RUQ pain.  O:  AF BP 130-150s/80-90s HR 70s RR 19  Gen: NAD SVE: 3/80/-2, vertex, AROM for clear fluid FHT: cat 1 Toco: q 3-4 min  A/P: Pt is a G2P1001 @37 .4wga here for IOL s/s PIH with severe features.  # IOL: - continue pitocin - s/p AROM for clear fluid  # Pre-eclampsia with severe features: - Magnesium per protocol - PIH labs WNL except UPC elevated to 0.67 - IV anti-hypertensives prn persistent severe range Bps - no IV meds since yesterday (s/p Lab 20/40 yesterday at 1830 and 2030) - CTM Bps closely  # FWB: - cat 1, reassuring  # GBS pos: - PCN per protocol  Lucillie Garfinkel MD

## 2017-07-29 NOTE — Anesthesia Preprocedure Evaluation (Signed)
Anesthesia Evaluation  Patient identified by MRN, date of birth, ID band Patient awake    Reviewed: Allergy & Precautions, H&P , NPO status , Patient's Chart, lab work & pertinent test results, reviewed documented beta blocker date and time   Airway Mallampati: II  TM Distance: >3 FB Neck ROM: full    Dental no notable dental hx.    Pulmonary neg pulmonary ROS,    Pulmonary exam normal breath sounds clear to auscultation       Cardiovascular hypertension, negative cardio ROS Normal cardiovascular exam Rhythm:regular Rate:Normal     Neuro/Psych negative neurological ROS  negative psych ROS   GI/Hepatic negative GI ROS, Neg liver ROS,   Endo/Other  negative endocrine ROS  Renal/GU negative Renal ROS  negative genitourinary   Musculoskeletal   Abdominal   Peds  Hematology negative hematology ROS (+)   Anesthesia Other Findings   Reproductive/Obstetrics (+) Pregnancy                             Anesthesia Physical Anesthesia Plan  ASA: III  Anesthesia Plan: Epidural   Post-op Pain Management:    Induction:   PONV Risk Score and Plan:   Airway Management Planned:   Additional Equipment:   Intra-op Plan:   Post-operative Plan:   Informed Consent: I have reviewed the patients History and Physical, chart, labs and discussed the procedure including the risks, benefits and alternatives for the proposed anesthesia with the patient or authorized representative who has indicated his/her understanding and acceptance.     Plan Discussed with:   Anesthesia Plan Comments:         Anesthesia Quick Evaluation

## 2017-07-30 LAB — COMPREHENSIVE METABOLIC PANEL
ALBUMIN: 2.6 g/dL — AB (ref 3.5–5.0)
ALT: 11 U/L — ABNORMAL LOW (ref 14–54)
AST: 23 U/L (ref 15–41)
Alkaline Phosphatase: 80 U/L (ref 38–126)
Anion gap: 9 (ref 5–15)
BILIRUBIN TOTAL: 0.3 mg/dL (ref 0.3–1.2)
BUN: 6 mg/dL (ref 6–20)
CHLORIDE: 106 mmol/L (ref 101–111)
CO2: 21 mmol/L — ABNORMAL LOW (ref 22–32)
Calcium: 7.3 mg/dL — ABNORMAL LOW (ref 8.9–10.3)
Creatinine, Ser: 0.69 mg/dL (ref 0.44–1.00)
GFR calc Af Amer: >60 mL/min (ref >60)
GFR calc non Af Amer: >60 mL/min (ref >60)
GLUCOSE: 95 mg/dL (ref 65–99)
POTASSIUM: 4.2 mmol/L (ref 3.5–5.1)
Sodium: 136 mmol/L (ref 135–145)
TOTAL PROTEIN: 5.8 g/dL — AB (ref 6.5–8.1)

## 2017-07-30 LAB — CBC
HEMATOCRIT: 34.4 % — AB (ref 36.0–46.0)
Hemoglobin: 11.6 g/dL — ABNORMAL LOW (ref 12.0–15.0)
MCH: 29.4 pg (ref 26.0–34.0)
MCHC: 33.7 g/dL (ref 30.0–36.0)
MCV: 87.3 fL (ref 78.0–100.0)
Platelets: 205 10*3/uL (ref 150–400)
RBC: 3.94 MIL/uL (ref 3.87–5.11)
RDW: 15.5 % (ref 11.5–15.5)
WBC: 10.7 10*3/uL — AB (ref 4.0–10.5)

## 2017-07-30 MED ORDER — MAGNESIUM SULFATE 40 G IN LACTATED RINGERS - SIMPLE
2.0000 g/h | INTRAVENOUS | Status: AC
  Filled 2017-07-30: qty 500

## 2017-07-30 MED ORDER — LACTATED RINGERS IV SOLN
INTRAVENOUS | Status: DC
  Administered 2017-07-30: 04:00:00 via INTRAVENOUS

## 2017-07-30 NOTE — Progress Notes (Addendum)
Post Partum Day 1 Subjective: no complaints, up ad lib, voiding and tolerating PO.  No HA, CP/SOB, RUQ pain, or visual disturbance.    Objective: Blood pressure (!) 142/75, pulse 69, temperature 98.3 F (36.8 C), temperature source Oral, resp. rate 16, height 5\' 10"  (1.778 m), weight 225 lb (102.1 kg), SpO2 99 %, unknown if currently breastfeeding. BP postpartum 116-154/69-94   Physical Exam:  General: alert, cooperative and appears stated age Lochia: appropriate Uterine Fundus: firm Incision: healing well, no significant drainage, no dehiscence DVT Evaluation: No evidence of DVT seen on physical exam. Negative Homan's sign. No cords or calf tenderness.   Recent Labs  07/29/17 1542 07/30/17 0610  HGB 13.2 11.6*  HCT 39.0 34.4*    Assessment/Plan: Plan for discharge tomorrow and Breastfeeding Pre-eclampsia with severe features-no symptoms.  Receiving mag recovery (up at 1500).  Labs wnl.  Add anti-hypertensive prn.  Excellent diuresis.   LOS: 2 days   Etai Copado 07/30/2017, 9:16 AM

## 2017-07-30 NOTE — Progress Notes (Signed)
UR chart review completed.  

## 2017-07-31 MED ORDER — NIFEDIPINE ER OSMOTIC RELEASE 30 MG PO TB24
30.0000 mg | ORAL_TABLET | Freq: Every day | ORAL | Status: DC
  Administered 2017-07-31: 30 mg via ORAL
  Filled 2017-07-31: qty 1

## 2017-07-31 MED ORDER — NIFEDIPINE ER 30 MG PO TB24: 30.0000 mg | ORAL_TABLET | Freq: Every day | ORAL | 1 refills | Status: DC

## 2017-07-31 NOTE — Progress Notes (Signed)
Discharge teaching complete with pt. Pt understood all information and did not have any questions. Pt discharged home to family. 

## 2017-07-31 NOTE — Discharge Summary (Signed)
Obstetric Discharge Summary Reason for Admission: induction of labor Prenatal Procedures: none Intrapartum Procedures: spontaneous vaginal delivery Postpartum Procedures: none Complications-Operative and Postpartum: hypertension Hemoglobin  Date Value Ref Range Status  07/30/2017 11.6 (L) 12.0 - 15.0 g/dL Final   HGB  Date Value Ref Range Status  08/04/2016 13.3 11.6 - 15.9 g/dL Final   HCT  Date Value Ref Range Status  07/30/2017 34.4 (L) 36.0 - 46.0 % Final  08/04/2016 40.7 34.8 - 46.6 % Final    Physical Exam:  General: alert Lochia: appropriate Uterine Fundus: firm Incision: healing well DVT Evaluation: No evidence of DVT seen on physical exam.  Discharge Diagnoses: Term Pregnancy-delivered and Preelampsia  Discharge Information: Date: 07/31/2017 Activity: pelvic rest Diet: routine Medications: PNV Condition: stable Instructions: refer to practice specific booklet Discharge to: home Follow-up [place] Schedule an appointment as soon as possible for a visit in 1 week(s).   Contact information: [place] [address] [phone]           Newborn Data: Live born female  Birth Weight: 5 lb 5 oz (2410 g) APGAR: 9, 9  Home with mother.  Betty [name] 07/31/2017, 8:28 AM

## 2017-08-03 ENCOUNTER — Other Ambulatory Visit: Payer: Self-pay | Admitting: *Deleted

## 2017-08-03 DIAGNOSIS — C50511 Malignant neoplasm of lower-outer quadrant of right female breast: Secondary | ICD-10-CM

## 2017-08-04 ENCOUNTER — Other Ambulatory Visit (HOSPITAL_BASED_OUTPATIENT_CLINIC_OR_DEPARTMENT_OTHER): Payer: BC Managed Care – PPO

## 2017-08-04 DIAGNOSIS — C50511 Malignant neoplasm of lower-outer quadrant of right female breast: Secondary | ICD-10-CM

## 2017-08-04 LAB — COMPREHENSIVE METABOLIC PANEL
ALT: 22 U/L (ref 0–55)
ANION GAP: 11 meq/L (ref 3–11)
AST: 21 U/L (ref 5–34)
Albumin: 3.5 g/dL (ref 3.5–5.0)
Alkaline Phosphatase: 88 U/L (ref 40–150)
BILIRUBIN TOTAL: 0.3 mg/dL (ref 0.20–1.20)
BUN: 14.6 mg/dL (ref 7.0–26.0)
CALCIUM: 9.9 mg/dL (ref 8.4–10.4)
CHLORIDE: 105 meq/L (ref 98–109)
CO2: 20 meq/L — AB (ref 22–29)
CREATININE: 0.8 mg/dL (ref 0.6–1.1)
Glucose: 82 mg/dl (ref 70–140)
Potassium: 3.8 mEq/L (ref 3.5–5.1)
Sodium: 137 mEq/L (ref 136–145)
TOTAL PROTEIN: 7.6 g/dL (ref 6.4–8.3)

## 2017-08-04 LAB — CBC WITH DIFFERENTIAL/PLATELET
BASO%: 0.1 % (ref 0.0–2.0)
Basophils Absolute: 0 10*3/uL (ref 0.0–0.1)
EOS%: 3.6 % (ref 0.0–7.0)
Eosinophils Absolute: 0.3 10*3/uL (ref 0.0–0.5)
HCT: 41.9 % (ref 34.8–46.6)
HGB: 13.9 g/dL (ref 11.6–15.9)
LYMPH#: 1.9 10*3/uL (ref 0.9–3.3)
LYMPH%: 26.8 % (ref 14.0–49.7)
MCH: 29.1 pg (ref 25.1–34.0)
MCHC: 33.2 g/dL (ref 31.5–36.0)
MCV: 87.8 fL (ref 79.5–101.0)
MONO#: 0.4 10*3/uL (ref 0.1–0.9)
MONO%: 6 % (ref 0.0–14.0)
NEUT%: 63.5 % (ref 38.4–76.8)
NEUTROS ABS: 4.5 10*3/uL (ref 1.5–6.5)
PLATELETS: 318 10*3/uL (ref 145–400)
RBC: 4.77 10*6/uL (ref 3.70–5.45)
RDW: 14.5 % (ref 11.2–14.5)
WBC: 7 10*3/uL (ref 3.9–10.3)

## 2017-08-11 ENCOUNTER — Ambulatory Visit (HOSPITAL_BASED_OUTPATIENT_CLINIC_OR_DEPARTMENT_OTHER): Payer: BC Managed Care – PPO | Admitting: Oncology

## 2017-08-11 ENCOUNTER — Telehealth: Payer: Self-pay | Admitting: Oncology

## 2017-08-11 VITALS — BP 151/83 | HR 64 | Temp 98.2°F | Resp 20 | Ht 70.0 in | Wt 204.4 lb

## 2017-08-11 DIAGNOSIS — Z1501 Genetic susceptibility to malignant neoplasm of breast: Secondary | ICD-10-CM | POA: Diagnosis not present

## 2017-08-11 DIAGNOSIS — C50511 Malignant neoplasm of lower-outer quadrant of right female breast: Secondary | ICD-10-CM

## 2017-08-11 DIAGNOSIS — Z8041 Family history of malignant neoplasm of ovary: Secondary | ICD-10-CM

## 2017-08-11 DIAGNOSIS — Z1509 Genetic susceptibility to other malignant neoplasm: Secondary | ICD-10-CM

## 2017-08-11 DIAGNOSIS — Z171 Estrogen receptor negative status [ER-]: Secondary | ICD-10-CM | POA: Diagnosis not present

## 2017-08-11 NOTE — Telephone Encounter (Signed)
Scheduled appt per 10/9 los. Patient did not want avs or calendar.

## 2017-08-11 NOTE — Progress Notes (Signed)
ID: Betty [name]   DOB: [birth year]  MR#: [id number]  XBJ#:[id number]  PCP: Betty Sake, MD GYN: Dr. Molli [name] SU: Dr. Coralie [name] OTHER MD:  Allergist: Dr. Annamaria [name]  CHIEF COMPLAINTS:  1)  triple negative Breast Cancer       2) BRCA 1 Positive    HISTORY OF PRESENT ILLNESS:  From the original intake note:  Betty [name] is a 41 year old woman from [facility], BRCA 1 positive, who presented with a painful, palpable mass in her right breast that she first noticed in January 2013. A bilateral mammogram and right breast ultrasound performed on 02/20/2012 showed an irregular hypoechoic mass at 8 o clock position measuring 2.1 x 1.1 x 1.6 cm.  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  MRI scan performed on 02/27/2012 showed this mass to be 2.2 x 1.6 x 1.9 cm. No other masses were seen.  She underwent 4 cycles of FEC treatment from 03/15/12 to 04/27/12 followed by 4 cycles of Taxotere from 05/11/12 to 06/22/12.  She underwent bilateral simple mastectomies with right axillary sentinel lymph node biopsy by Dr. Ninfa [name] on 08/05/12 with complete pathological response.    Subsequent history is as detailed below.   INTERVAL HISTORY:   Betty [name] returns today for follow-up of her BRCA positive breast cancer. Generally she is doing well. She reports giving birth to a baby girl about 2 weeks ago. Her daughter, Betty [name], was 5 pounds 3 ounces. Pt notes she had to be medically induced because of her high blood pressure. She did not have gestational diabetes She continues to see Dr. Matthew [name] who has her on a blood pressure medication.  REVIEW OF SYSTEMS: Betty [name] reports headaches related to her allergies. She denies unusual headaches, visual changes, nausea, vomiting, or dizziness. There has been no unusual cough, phlegm production, or pleurisy. This been no change in bowel or  bladder habits. She denies unexplained fatigue or unexplained weight loss, bleeding, rash, or fever. A detailed review of systems was otherwise negative.   PAST MEDICAL HISTORY: Past Medical History:  Diagnosis Date  . Allergic rhinitis   . Breast cancer, BRCA1 positive (Betty [name]) RIGHT INVASIVE DUCTAL CANCER---  S/P BILATERAL MASTECTOMY W/ RECONSTRUCTION  08-05-2012--  CHEMO ENEDED 06-22-2012   ONCOLOGIST- DR Betty [name]; chemo    PAST SURGICAL HISTORY: Past Surgical History:  Procedure Laterality Date  . BREAST RECONSTRUCTION  08/05/2012   Procedure: BREAST RECONSTRUCTION;  Surgeon: Betty Kos, DO;  Location: [place];  Service: Plastics;  Laterality: Bilateral;  . INGUINAL HERNIA REPAIR  AS INFANT  . MASTECTOMY W/ SENTINEL NODE BIOPSY  08/05/2012   Procedure: MASTECTOMY WITH SENTINEL LYMPH NODE BIOPSY;  Surgeon: Betty Bowie, MD;  Location: [place];  Service: General;  Laterality: Right;   AND LEFT MASTECTOMY  . PORT-A-CATH REMOVAL  08/05/2012   Procedure: REMOVAL PORT-A-CATH;  Surgeon: Betty Bowie, MD;  Location: [facility];  Service: General;  Laterality: N/A;  . PORTACATH PLACEMENT  03/10/2012   Procedure: INSERTION PORT-A-CATH;  Surgeon: Betty Bowie, MD;  Location: [facility];  Service: General;  Laterality: N/A;  . Lead   - labrum and capsular repair  . TISSUE EXPANDER PLACEMENT  08/05/2012   Procedure: TISSUE EXPANDER;  Surgeon: Betty Kos, DO;  Location: [place];  Service: Plastics;  Laterality: Bilateral;  With Expanders and Flex HD Placement   .  TRANSTHORACIC ECHOCARDIOGRAM  03-08-2012   NORMAL LVSF/ EF 60-65%    FAMILY HISTORY Family History  Problem Relation Age of Onset  . Ovarian cancer Mother 17  . Cancer Mother        ovarian  . Allergic rhinitis Unknown        grandmother  . Cancer Paternal Uncle        leukemia  . Cancer Paternal Uncle        lung cancer    GYNECOLOGIC HISTORY:   (Updated 02/13/2014) Periods interrupted with  chemotherapy, resumed May 2014, and normal since.  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  one son from an earlier marriage, Betty [name], currently 39 y/o, interested in baseball particularly; 1 daughter from her current marriage, Betty [name], born October 2018  HEALTH MAINTENANCE:  (Updated 02/13/2014) Social History  Substance Use Topics  . Smoking status: Never Smoker  . Smokeless tobacco: Never Used  . Alcohol use No     Comment: none during pregnancy    PAP:  UTD/ [place]  Bone density: Never  Lipid panel:  Not on file  Colonoscopy: Never  Allergies  Allergen Reactions  . Betty [name] Rash    Current Outpatient Prescriptions  Medication Sig Dispense Refill  . NIFEdipine (PROCARDIA-XL/ADALAT CC) 30 MG 24 hr tablet Take 1 tablet (30 mg total) by mouth daily. 20 tablet 1  . Prenatal Vit-Fe Fumarate-FA (PRENATAL MULTIVITAMIN) TABS tablet Take 1 tablet by mouth daily at 12 noon.     No current facility-administered medications for this visit.     OBJECTIVE: Young white Woman In no acute distress  Vitals:   08/11/17 1545  BP: (!) 151/83  Pulse: 64  Resp: 20  Temp: 98.2 F (36.8 C)  SpO2: 100%     Body mass index is 29.33 kg/m.    ECOG FS: 0 Filed Weights   08/11/17 1545  Weight: 204 lb 6.4 oz (92.7 kg)   Sclerae unicteric, pupils round and equal Oropharynx clear and moist No cervical or supraclavicular adenopathy Lungs no rales or rhonchi Heart regular rate and rhythm Abd soft, nontender, positive bowel sounds MSK no focal spinal tenderness, no upper extremity lymphedema Neuro: nonfocal, well oriented, appropriate affect Breasts: She is status post bilateral mastectomies, with bilateral reconstruction. The cosmetic result is very good. There is no evidence of local recurrence. Both axillae are benign.  LAB RESULTS: Lab Results  Component Value Date   WBC 7.0  08/04/2017   NEUTROABS 4.5 08/04/2017   HGB 13.9 08/04/2017   HCT 41.9 08/04/2017   MCV 87.8 08/04/2017   PLT 318 08/04/2017      Chemistry      Component Value Date/Time   NA 137 08/04/2017 1602   K 3.8 08/04/2017 1602   CL 106 07/30/2017 0814   CL 104 12/20/2012 1006   CO2 20 (L) 08/04/2017 1602   BUN 14.6 08/04/2017 1602   CREATININE 0.8 08/04/2017 1602      Component Value Date/Time   CALCIUM 9.9 08/04/2017 1602   ALKPHOS 88 08/04/2017 1602   AST 21 08/04/2017 1602   ALT 22 08/04/2017 1602   BILITOT 0.30 08/04/2017 1602      STUDIES: No results found.   ASSESSMENT: 41 y.o. BRCA1 positive [IVS5-11T>G] [place] woman:  #1  S/p biopsy of the right breast lower outer quadrant on 02/20/12 showing a clinical T2, N0, M0, stage IIA  Invasive  ductal carcinoma, grade 3, triple negative, with a Ki67 of 98%  #2  Completed 4 cycles of fluorouracil, epirubicin and cyclophosphamide from 03/15/12 to 04/27/12 followed by 4 cycles of docetaxel from 05/11/12 to 06/22/12.  #3  Bilateral simple mastectomies with right axillary sentinel lymph node biopsy on 08/05/12 showed a complete pathological response.  #4 BSO pending  PLAN:  Alorah is now 5 years out from definitive surgery for her triple negative breast cancer with no evidence of disease recurrence. This is very favorable.  She carries a BRCA mutation and understands she is at risk for ovarian cancer, which was diagnosed in her mother at age 4. Currently she is being followed as closely as possible through Dr. Matthew [name], with biannual ultrasounds and lab work. The patient understands that we do not have data of this actually improved survival in ovarian cancer and she is strongly considering having bilateral salpingo-oophorectomies once she turns 75, which will be in January of next year  She and her husband are delighted at their daughter. If they were 10 years younger they would want other children but given Betty [name]'s age they are  appropriately concerned and at this point likely are done with family planning. If they do decide to have other children she understands my recommendation is that they do this ASAP so she can get on with her definitive surgery.  I'm going to see her a year from now. If she has undergone bilateral salpingo-oophorectomy I will probably release her from follow-up at that point.  She knows to call for any other problems that may develop before her next visit.   Chauncey Cruel, MD 08/11/17 4:09 PM Medical Oncology and Hematology [facility] [address] [place], [place] [address] Tel. [phone] Fax. [phone]  This document serves as a record of services personally performed by Lurline Del, MD. It was created on her behalf by Margit Banda, a trained medical scribe. The creation of this record is based on the scribe's personal observations and the provider's statements to them. This document has been checked and approved by the attending provider.

## 2018-01-27 NOTE — H&P (Signed)
Betty [name], Betty [name] NO.:  [id number]  MEDICAL RECORD NO.:  [id number]  LOCATION:                                 FACILITY:  PHYSICIAN:  Ralene Bathe. Matthew Saras, M.D.    DATE OF BIRTH:  DATE OF ADMISSION: DATE OF DISCHARGE:                             HISTORY & PHYSICAL   CHIEF COMPLAINT:  Positive BRCA.  HISTORY OF PRESENT ILLNESS:  A 42 year old, G2, now P2, delivered recently.  She has had a bilateral mastectomy for a breast cancer and positive BRCA.  Her mother had ovarian cancer.  Her hematologist had recommended BSO after she completed child bearing.  She presents at this time for laparoscopic BSO.  This procedure including specific risks regarding bleeding, infection, transfusion, adjacent organ injury, the possible need to complete the surgery by open technique, other symptoms that may occur, hot flashes, night sweats, and nonhormonal ways to deal with those symptoms have been discussed.  ALLERGIES:  Dextromethorphan.  CURRENT MEDICATIONS:  None.  PAST SURGICAL HISTORY: 1. She has had shoulder surgery. 2. Vaginal delivery in 2006 and again in August 2018. 3. Bilateral mastectomy in 2013. 4. Reconstructive surgery 2014.  REVIEW OF SYSTEMS:  Significant for breast and ovarian cancer.  SOCIAL HISTORY:  Denies tobacco or drug use.  She does drink limited social alcohol.  She is divorced.  Dr. Lisbeth Ply is her PCP.  PHYSICAL EXAMINATION:  VITAL SIGNS:  Temp 98.2, blood pressure 120/82. HEENT:  Unremarkable. NECK:  Supple without masses. LUNGS:  Clear. CARDIOVASCULAR:  Regular rate and rhythm without murmurs, rubs, gallops. BREASTS:  Reveals breast reconstruction.  No masses noted. ABDOMEN:  Soft, flat, nontender. PELVIC:  Vulva, vagina, cervix normal.  Last Pap in March 2018 was normal.  Uterus mid position, nontender, mobile, and adnexa negative. EXTREMITIES:  Unremarkable. NEUROLOGIC:  Unremarkable.  IMPRESSION:  Status post bilateral mastectomy,  family history of ovarian cancer, positive BRCA.  PLAN:  Laparoscopic BSO.  Procedure and risks reviewed as above.     Conlee Sliter M. Matthew Saras, M.D.   ______________________________ Ralene Bathe. Matthew Saras, M.D.    [facility]/MEDQ  D:  01/26/2018  T:  01/27/2018  Job:  [id number]

## 2018-02-05 ENCOUNTER — Encounter (HOSPITAL_BASED_OUTPATIENT_CLINIC_OR_DEPARTMENT_OTHER): Payer: Self-pay | Admitting: Emergency Medicine

## 2018-02-05 ENCOUNTER — Other Ambulatory Visit: Payer: Self-pay

## 2018-02-05 NOTE — Progress Notes (Signed)
SPOKE WITH: PATIENT  RIDING HOME WITH: MOTHER ALICE OSBORNE, [phone] AM MEDICATIONS: NONE NPO STATUS: NPO AFTER MIDNIGHT  LABS: APPT MADE FOR 02-08-18  AT 1100 TO COLLECT TYPE AND SCREEN  COMMENTS/CONCERNS: N/A

## 2018-02-08 ENCOUNTER — Encounter (HOSPITAL_COMMUNITY)
Admission: RE | Admit: 2018-02-08 | Discharge: 2018-02-08 | Disposition: A | Discharge status: Home / Self Care | Payer: BC Managed Care – PPO | Source: Ambulatory Visit | Attending: Obstetrics and Gynecology | Admitting: Obstetrics and Gynecology

## 2018-02-08 DIAGNOSIS — Z01812 Encounter for preprocedural laboratory examination: Secondary | ICD-10-CM | POA: Diagnosis not present

## 2018-02-08 LAB — ABO/RH: ABO/RH(D): O POS

## 2018-02-15 ENCOUNTER — Ambulatory Visit (HOSPITAL_BASED_OUTPATIENT_CLINIC_OR_DEPARTMENT_OTHER): Payer: BC Managed Care – PPO | Admitting: Anesthesiology

## 2018-02-15 ENCOUNTER — Encounter (HOSPITAL_BASED_OUTPATIENT_CLINIC_OR_DEPARTMENT_OTHER): Payer: Self-pay

## 2018-02-15 ENCOUNTER — Encounter (HOSPITAL_BASED_OUTPATIENT_CLINIC_OR_DEPARTMENT_OTHER)
Admission: RE | Disposition: A | Discharge status: Home / Self Care | Payer: Self-pay | Source: Ambulatory Visit | Attending: Obstetrics and Gynecology

## 2018-02-15 ENCOUNTER — Ambulatory Visit (HOSPITAL_BASED_OUTPATIENT_CLINIC_OR_DEPARTMENT_OTHER)
Admission: RE | Admit: 2018-02-15 | Discharge: 2018-02-15 | Disposition: A | Discharge status: Home / Self Care | Payer: BC Managed Care – PPO | Source: Ambulatory Visit | Attending: Obstetrics and Gynecology | Admitting: Obstetrics and Gynecology

## 2018-02-15 ENCOUNTER — Other Ambulatory Visit: Payer: Self-pay

## 2018-02-15 DIAGNOSIS — I10 Essential (primary) hypertension: Secondary | ICD-10-CM | POA: Diagnosis not present

## 2018-02-15 DIAGNOSIS — K219 Gastro-esophageal reflux disease without esophagitis: Secondary | ICD-10-CM | POA: Diagnosis not present

## 2018-02-15 DIAGNOSIS — Z853 Personal history of malignant neoplasm of breast: Secondary | ICD-10-CM | POA: Insufficient documentation

## 2018-02-15 DIAGNOSIS — Z1501 Genetic susceptibility to malignant neoplasm of breast: Secondary | ICD-10-CM | POA: Insufficient documentation

## 2018-02-15 DIAGNOSIS — Z8041 Family history of malignant neoplasm of ovary: Secondary | ICD-10-CM | POA: Diagnosis not present

## 2018-02-15 DIAGNOSIS — Z9013 Acquired absence of bilateral breasts and nipples: Secondary | ICD-10-CM | POA: Diagnosis not present

## 2018-02-15 HISTORY — PX: LAPAROSCOPIC SALPINGO OOPHERECTOMY: SHX5927

## 2018-02-15 HISTORY — DX: Cardiac murmur, unspecified: R01.1

## 2018-02-15 HISTORY — DX: Unspecified pre-eclampsia, unspecified trimester: O14.90

## 2018-02-15 HISTORY — DX: Gastro-esophageal reflux disease without esophagitis: K21.9

## 2018-02-15 LAB — POCT PREGNANCY, URINE: Preg Test, Ur: NEGATIVE

## 2018-02-15 LAB — TYPE AND SCREEN
ABO/RH(D): O POS
ANTIBODY SCREEN: NEGATIVE

## 2018-02-15 SURGERY — SALPINGO-OOPHORECTOMY, LAPAROSCOPIC
Anesthesia: General | Site: Abdomen | Laterality: Bilateral

## 2018-02-15 MED ORDER — PROMETHAZINE HCL 25 MG/ML IJ SOLN
6.2500 mg | INTRAMUSCULAR | Status: DC | PRN
  Filled 2018-02-15: qty 1

## 2018-02-15 MED ORDER — KETOROLAC TROMETHAMINE 30 MG/ML IJ SOLN
30.0000 mg | Freq: Once | INTRAMUSCULAR | Status: DC | PRN
  Filled 2018-02-15: qty 1

## 2018-02-15 MED ORDER — MIDAZOLAM HCL 2 MG/2ML IJ SOLN
INTRAMUSCULAR | Status: DC | PRN
  Administered 2018-02-15: 2 mg via INTRAVENOUS

## 2018-02-15 MED ORDER — ONDANSETRON HCL 4 MG/2ML IJ SOLN
INTRAMUSCULAR | Status: AC
  Filled 2018-02-15: qty 2

## 2018-02-15 MED ORDER — BUPIVACAINE HCL (PF) 0.25 % IJ SOLN
INTRAMUSCULAR | Status: DC | PRN
  Administered 2018-02-15: 20 mL

## 2018-02-15 MED ORDER — ROCURONIUM BROMIDE 10 MG/ML (PF) SYRINGE
PREFILLED_SYRINGE | INTRAVENOUS | Status: AC
  Filled 2018-02-15: qty 5

## 2018-02-15 MED ORDER — FENTANYL CITRATE (PF) 100 MCG/2ML IJ SOLN
INTRAMUSCULAR | Status: AC
  Filled 2018-02-15: qty 2

## 2018-02-15 MED ORDER — OXYCODONE-ACETAMINOPHEN 2.5-325 MG PO TABS: 1.0000 | ORAL_TABLET | ORAL | 0 refills | Status: DC | PRN

## 2018-02-15 MED ORDER — HYDROMORPHONE HCL 1 MG/ML IJ SOLN
INTRAMUSCULAR | Status: AC
  Filled 2018-02-15: qty 1

## 2018-02-15 MED ORDER — LACTATED RINGERS IV SOLN
INTRAVENOUS | Status: DC
  Administered 2018-02-15: 1000 mL via INTRAVENOUS
  Administered 2018-02-15: 07:00:00 via INTRAVENOUS
  Filled 2018-02-15: qty 1000

## 2018-02-15 MED ORDER — PROPOFOL 10 MG/ML IV BOLUS
INTRAVENOUS | Status: DC | PRN
  Administered 2018-02-15: 180 mg via INTRAVENOUS

## 2018-02-15 MED ORDER — KETOROLAC TROMETHAMINE 30 MG/ML IJ SOLN
INTRAMUSCULAR | Status: DC | PRN
  Administered 2018-02-15: 30 mg via INTRAVENOUS

## 2018-02-15 MED ORDER — LIDOCAINE 2% (20 MG/ML) 5 ML SYRINGE
INTRAMUSCULAR | Status: AC
  Filled 2018-02-15: qty 5

## 2018-02-15 MED ORDER — IBUPROFEN 200 MG PO TABS: 600.0000 mg | ORAL_TABLET | Freq: Four times a day (QID) | ORAL | 1 refills | Status: AC | PRN

## 2018-02-15 MED ORDER — SUGAMMADEX SODIUM 200 MG/2ML IV SOLN
INTRAVENOUS | Status: AC
  Filled 2018-02-15: qty 2

## 2018-02-15 MED ORDER — PROPOFOL 10 MG/ML IV BOLUS
INTRAVENOUS | Status: AC
  Filled 2018-02-15: qty 40

## 2018-02-15 MED ORDER — HYDROMORPHONE HCL 1 MG/ML IJ SOLN
0.2500 mg | INTRAMUSCULAR | Status: DC | PRN
  Administered 2018-02-15 (×2): 0.25 mg via INTRAVENOUS
  Filled 2018-02-15: qty 0.5

## 2018-02-15 MED ORDER — ONDANSETRON HCL 4 MG/2ML IJ SOLN
INTRAMUSCULAR | Status: DC | PRN
  Administered 2018-02-15: 4 mg via INTRAVENOUS

## 2018-02-15 MED ORDER — DEXAMETHASONE SODIUM PHOSPHATE 10 MG/ML IJ SOLN
INTRAMUSCULAR | Status: AC
  Filled 2018-02-15: qty 1

## 2018-02-15 MED ORDER — OXYCODONE HCL 5 MG PO TABS
5.0000 mg | ORAL_TABLET | Freq: Once | ORAL | Status: DC | PRN
  Filled 2018-02-15: qty 1

## 2018-02-15 MED ORDER — ARTIFICIAL TEARS OPHTHALMIC OINT
TOPICAL_OINTMENT | OPHTHALMIC | Status: AC
  Filled 2018-02-15: qty 3.5

## 2018-02-15 MED ORDER — OXYCODONE HCL 5 MG/5ML PO SOLN
5.0000 mg | Freq: Once | ORAL | Status: DC | PRN
  Filled 2018-02-15: qty 5

## 2018-02-15 MED ORDER — ROCURONIUM BROMIDE 10 MG/ML (PF) SYRINGE
PREFILLED_SYRINGE | INTRAVENOUS | Status: DC | PRN
  Administered 2018-02-15: 10 mg via INTRAVENOUS
  Administered 2018-02-15: 40 mg via INTRAVENOUS

## 2018-02-15 MED ORDER — MIDAZOLAM HCL 2 MG/2ML IJ SOLN
INTRAMUSCULAR | Status: AC
  Filled 2018-02-15: qty 2

## 2018-02-15 MED ORDER — FENTANYL CITRATE (PF) 100 MCG/2ML IJ SOLN
INTRAMUSCULAR | Status: DC | PRN
  Administered 2018-02-15 (×4): 50 ug via INTRAVENOUS

## 2018-02-15 MED ORDER — KETOROLAC TROMETHAMINE 30 MG/ML IJ SOLN
INTRAMUSCULAR | Status: AC
  Filled 2018-02-15: qty 1

## 2018-02-15 MED ORDER — DEXAMETHASONE SODIUM PHOSPHATE 10 MG/ML IJ SOLN
INTRAMUSCULAR | Status: DC | PRN
  Administered 2018-02-15: 10 mg via INTRAVENOUS

## 2018-02-15 MED ORDER — LIDOCAINE 2% (20 MG/ML) 5 ML SYRINGE
INTRAMUSCULAR | Status: DC | PRN
  Administered 2018-02-15: 80 mg via INTRAVENOUS

## 2018-02-15 SURGICAL SUPPLY — 39 items
ADH SKN CLS APL DERMABOND .7 (GAUZE/BANDAGES/DRESSINGS) ×1
BLADE CLIPPER SURG (BLADE) IMPLANT
CANISTER SUCT 3000ML PPV (MISCELLANEOUS) IMPLANT
CATH ROBINSON RED A/P 16FR (CATHETERS) ×2 IMPLANT
COVER MAYO STAND STRL (DRAPES) ×2 IMPLANT
DERMABOND ADVANCED (GAUZE/BANDAGES/DRESSINGS) ×1
DERMABOND ADVANCED .7 DNX12 (GAUZE/BANDAGES/DRESSINGS) IMPLANT
DRSG COVADERM PLUS 2X2 (GAUZE/BANDAGES/DRESSINGS) IMPLANT
DRSG OPSITE POSTOP 3X4 (GAUZE/BANDAGES/DRESSINGS) ×2 IMPLANT
DURAPREP 26ML APPLICATOR (WOUND CARE) ×2 IMPLANT
ELECT REM PT RETURN 9FT ADLT (ELECTROSURGICAL) ×2
ELECTRODE REM PT RTRN 9FT ADLT (ELECTROSURGICAL) ×1 IMPLANT
GLOVE BIO SURGEON STRL SZ7 (GLOVE) ×4 IMPLANT
GOWN STRL REUS W/ TWL LRG LVL3 (GOWN DISPOSABLE) ×1 IMPLANT
GOWN STRL REUS W/TWL LRG LVL3 (GOWN DISPOSABLE) ×2
KIT TURNOVER CYSTO (KITS) ×2 IMPLANT
NDL INSUFFLATION 14GA 120MM (NEEDLE) ×1 IMPLANT
NEEDLE INSUFFLATION 14GA 120MM (NEEDLE) ×2 IMPLANT
NS IRRIG 500ML POUR BTL (IV SOLUTION) ×2 IMPLANT
PACK LAPAROSCOPY BASIN (CUSTOM PROCEDURE TRAY) ×2 IMPLANT
PACK TRENDGUARD 450 HYBRID PRO (MISCELLANEOUS) IMPLANT
PAD OB MATERNITY 4.3X12.25 (PERSONAL CARE ITEMS) ×2 IMPLANT
PAD PREP 24X48 CUFFED NSTRL (MISCELLANEOUS) ×2 IMPLANT
SCISSORS LAP 5X35 DISP (ENDOMECHANICALS) IMPLANT
SEALER TISSUE G2 CVD JAW 45CM (ENDOMECHANICALS) ×1 IMPLANT
SET IRRIG TUBING LAPAROSCOPIC (IRRIGATION / IRRIGATOR) IMPLANT
SHEARS HARMONIC ACE PLUS 45CM (MISCELLANEOUS) IMPLANT
SUT MNCRL AB 4-0 PS2 18 (SUTURE) ×2 IMPLANT
SUT VICRYL 0 UR6 27IN ABS (SUTURE) IMPLANT
SYRINGE 10CC LL (SYRINGE) ×2 IMPLANT
SYS BAG RETRIEVAL 10MM (BASKET)
SYSTEM BAG RETRIEVAL 10MM (BASKET) IMPLANT
TOWEL OR 17X24 6PK STRL BLUE (TOWEL DISPOSABLE) ×4 IMPLANT
TRENDGUARD 450 HYBRID PRO PACK (MISCELLANEOUS)
TROCAR OPTI TIP 5M 100M (ENDOMECHANICALS) ×2 IMPLANT
TROCAR XCEL BLUNT TIP 100MML (ENDOMECHANICALS) IMPLANT
TROCAR XCEL DIL TIP R 11M (ENDOMECHANICALS) ×2 IMPLANT
TUBING INSUF HEATED (TUBING) ×2 IMPLANT
WARMER LAPAROSCOPE (MISCELLANEOUS) ×2 IMPLANT

## 2018-02-15 NOTE — Discharge Instructions (Signed)
HOME CARE INSTRUCTIONS - LAPAROSCOPY  Wound Care: The bandaids or dressing which are placed over the skin openings may be removed the day after surgery. The incision should be kept clean and dry. The stitches do not need to be removed. Should the incision become sore, red, and swollen after the first week, check with your doctor.  Personal Hygiene: Shower the day after your procedure. Always wipe from front to back after elimination.   Activity: Do not drive or operate any equipment today. The effects of the anesthesia are still present and drowsiness may result. Rest today, not necessarily flat bed rest, just take it easy. You may resume your normal activity in one to three days or as instructed by your physician.  Sexual Activity: You resume sexual activity as indicated by your physician_________. If your laparoscopy was for a sterilization ( tubes tied ), continue current method of birth control until after your next period or ask for specific instructions from your doctor.  Diet: Eat a light diet as desired this evening. You may resume a regular diet tomorrow.  Return to Work: Two to three days or as indicated by your doctor.  Expectations After Surgery: Your surgery will cause vaginal drainage or spotting which may continue for 2-3 days. Mild abdominal discomfort or tenderness is not unusual and some shoulder pain may also be noted which can be relieved by lying flat in pain.  Call Your Doctor If these Occur:  Persistent or heavy bleeding at incision site       Redness or swelling around incision       Elevation of temperature greater than 100 degrees F  Do not take any nonsteroidal anti inflammatories (Ibuprofen, Advil, Aleve, Motrin) until after 2:00 pm today.          Post Anesthesia Home Care Instructions  Activity: Get plenty of rest for the remainder of the day. A responsible individual must stay with you for 24 hours following the procedure.  For the next 24 hours, DO  NOT: -Drive a car -Paediatric nurse -Drink alcoholic beverages -Take any medication unless instructed by your physician -Make any legal decisions or sign important papers.  Meals: Start with liquid foods such as gelatin or soup. Progress to regular foods as tolerated. Avoid greasy, spicy, heavy foods. If nausea and/or vomiting occur, drink only clear liquids until the nausea and/or vomiting subsides. Call your physician if vomiting continues.  Special Instructions/Symptoms: Your throat may feel dry or sore from the anesthesia or the breathing tube placed in your throat during surgery. If this causes discomfort, gargle with warm salt water. The discomfort should disappear within 24 hours.

## 2018-02-15 NOTE — Transfer of Care (Signed)
  Last Vitals:  Vitals Value Taken Time  BP 121/79 02/15/2018  8:35 AM  Temp 36.6 C 02/15/2018  8:35 AM  Pulse 76 02/15/2018  8:37 AM  Resp 15 02/15/2018  8:37 AM  SpO2 98 % 02/15/2018  8:37 AM  Vitals shown include unvalidated device data.  Last Pain:  Vitals:   02/15/18 0622  TempSrc:   PainSc: 0-No pain        Immediate Anesthesia Transfer of Care Note  Patient: Betty [name]  Procedure(s) Performed: Procedure(s) (LRB): LAPAROSCOPIC SALPINGO OOPHORECTOMY (Bilateral)  Patient Location: PACU  Anesthesia Type: General  Level of Consciousness: awake, alert  and oriented  Airway & Oxygen Therapy: Patient Spontanous Breathing and Patient connected to nasal cannula oxygen  Post-op Assessment: Report given to PACU RN and Post -op Vital signs reviewed and stable  Post vital signs: Reviewed and stable  Complications: No apparent anesthesia complications

## 2018-02-15 NOTE — Anesthesia Postprocedure Evaluation (Signed)
Anesthesia Post Note  Patient: Betty [name]  Procedure(s) Performed: LAPAROSCOPIC SALPINGO OOPHORECTOMY (Bilateral Abdomen)     Patient location during evaluation: PACU Anesthesia Type: General Level of consciousness: awake and alert Pain management: pain level controlled Vital Signs Assessment: post-procedure vital signs reviewed and stable Respiratory status: spontaneous breathing, nonlabored ventilation, respiratory function stable and patient connected to nasal cannula oxygen Cardiovascular status: blood pressure returned to baseline and stable Postop Assessment: no apparent nausea or vomiting Anesthetic complications: no    Last Vitals:  Vitals:   02/15/18 0940 02/15/18 0945  BP:  125/73  Pulse: (!) 54 (!) 53  Resp: 12 11  Temp:    SpO2: 98% 97%    Last Pain:  Vitals:   02/15/18 0945  TempSrc:   PainSc: 1                  Briah Nary S

## 2018-02-15 NOTE — Anesthesia Procedure Notes (Signed)
Procedure Name: Intubation Date/Time: 02/15/2018 7:40 AM Performed by: Myrtie Soman, MD Pre-anesthesia Checklist: Patient identified, Emergency Drugs available, Suction available and Patient being monitored Patient Re-evaluated:Patient Re-evaluated prior to induction Oxygen Delivery Method: Circle system utilized Preoxygenation: Pre-oxygenation with 100% oxygen Induction Type: IV induction Ventilation: Mask ventilation without difficulty Laryngoscope Size: Mac and 3 Grade View: Grade I Tube type: Oral Tube size: 7.0 mm Number of attempts: 1 Airway Equipment and Method: Stylet and Oral airway Placement Confirmation: ETT inserted through vocal cords under direct vision,  positive ETCO2 and breath sounds checked- equal and bilateral Secured at: 22 cm Tube secured with: Tape Dental Injury: Teeth and Oropharynx as per pre-operative assessment

## 2018-02-15 NOTE — Op Note (Signed)
Preoperative diagnosis: BRCA positive  Postoperative diagnosis: Same  Procedure: Laparoscopic BSO  Surgeon: Matthew Saras  Anesthesia: General  EBL: 5 cc  Procedure and findings:  The patient was taken to the operating room after an adequate level of general anesthesia was obtained with the patient's legs in stirrups the abdomen perineum and vagina were prepped and draped the bladder was drained, EUA was carried out the uterus midposition, mobile adnexa negative.  Hulka tenaculum was positioned.  This was done after appropriate timeouts were taken.  Attention directed to the abdomen where the subumbilical area was infiltrated with quarter percent Marcaine plain a small incision was made in the varies needle was introduced without difficulty.  Its intra-abdominal position was verified by pressure and water testing.  After 2 L pneumoperitoneum was then created, laparoscopic trocar and sleeve were then introduced without difficulty.  There was no evidence of any bleeding or trauma.  3 fingerbreadths above the symphysis in the midline a 5 mm trocar was inserted under direct visualization.  With the patient in Trendelenburg and the uterus anteflexed, the pelvic findings as follows:  The uterus anterior posterior cul-de-sac spaces and bilateral adnexa were free and clear no other abnormalities were noted.  An atraumatic grasping forcep was then used to grasp the right fimbriated end, the uterus was placed on traction to the opposite side the course of the IP ligament on that side was outlined, the ureter was well below, the Enseal device was then used to coagulate and divide the infundibulopelvic ligament down to its and at the uterus, the tube and ovary were then dissected free and held temporarily.  The exact same repeated on the opposite side after identifying the anatomy, the course of the ureter again well below the operative site.  These areas were hemostatic the lower incision was then enlarged slightly, a  long Kelly clamp was then placed under direct visualization and each tube and ovary were removed intact and sent to pathology.  Inspection of the operative site revealed to be hemostatic.  Prior to closure, sponge, needle, instrument counts reported as correct x2.  The lower incision fascia closed with a 2-0 Vicryl suture.  4-0 Monocryl for the skin, 4-0 Monocryl subcuticular closure for the umbilical incision with small honeycomb dressings.  She tolerated this well went to recovery room in good condition.  Dictated with Dragon Medical 1  Margarette Asal MD

## 2018-02-15 NOTE — Anesthesia Preprocedure Evaluation (Signed)
Anesthesia Evaluation  Patient identified by MRN, date of birth, ID band Patient awake    Reviewed: Allergy & Precautions, NPO status , Patient's Chart, lab work & pertinent test results  Airway Mallampati: II  TM Distance: >3 FB Neck ROM: Full    Dental no notable dental hx.    Pulmonary neg pulmonary ROS,    Pulmonary exam normal breath sounds clear to auscultation       Cardiovascular hypertension, Normal cardiovascular exam Rhythm:Regular Rate:Normal     Neuro/Psych negative neurological ROS  negative psych ROS   GI/Hepatic Neg liver ROS, GERD  ,  Endo/Other  negative endocrine ROS  Renal/GU negative Renal ROS  negative genitourinary   Musculoskeletal negative musculoskeletal ROS (+)   Abdominal   Peds negative pediatric ROS (+)  Hematology negative hematology ROS (+)   Anesthesia Other Findings   Reproductive/Obstetrics negative OB ROS                             Anesthesia Physical Anesthesia Plan  ASA: II  Anesthesia Plan: General   Post-op Pain Management:    Induction: Intravenous  PONV Risk Score and Plan: 4 or greater and Ondansetron, Dexamethasone, Midazolam and Treatment may vary due to age or medical condition  Airway Management Planned: Oral ETT  Additional Equipment:   Intra-op Plan:   Post-operative Plan: Extubation in OR  Informed Consent: I have reviewed the patients History and Physical, chart, labs and discussed the procedure including the risks, benefits and alternatives for the proposed anesthesia with the patient or authorized representative who has indicated his/her understanding and acceptance.   Dental advisory given  Plan Discussed with: CRNA and Surgeon  Anesthesia Plan Comments:         Anesthesia Quick Evaluation

## 2018-02-15 NOTE — Progress Notes (Signed)
The patient was re-examined with no change in status 

## 2018-02-16 ENCOUNTER — Encounter (HOSPITAL_BASED_OUTPATIENT_CLINIC_OR_DEPARTMENT_OTHER): Payer: Self-pay | Admitting: Obstetrics and Gynecology

## 2018-08-10 ENCOUNTER — Ambulatory Visit (INDEPENDENT_AMBULATORY_CARE_PROVIDER_SITE_OTHER): Payer: BC Managed Care – PPO | Admitting: Plastic Surgery

## 2018-08-10 ENCOUNTER — Encounter: Payer: Self-pay | Admitting: Plastic Surgery

## 2018-08-10 DIAGNOSIS — N644 Mastodynia: Secondary | ICD-10-CM | POA: Diagnosis not present

## 2018-08-10 DIAGNOSIS — C50511 Malignant neoplasm of lower-outer quadrant of right female breast: Secondary | ICD-10-CM

## 2018-08-10 DIAGNOSIS — Z9013 Acquired absence of bilateral breasts and nipples: Secondary | ICD-10-CM | POA: Diagnosis not present

## 2018-08-10 DIAGNOSIS — Z1501 Genetic susceptibility to malignant neoplasm of breast: Secondary | ICD-10-CM

## 2018-08-10 DIAGNOSIS — Z1231 Encounter for screening mammogram for malignant neoplasm of breast: Secondary | ICD-10-CM

## 2018-08-10 DIAGNOSIS — T8544XA Capsular contracture of breast implant, initial encounter: Secondary | ICD-10-CM

## 2018-08-10 DIAGNOSIS — Z1509 Genetic susceptibility to other malignant neoplasm: Secondary | ICD-10-CM

## 2018-08-10 NOTE — Progress Notes (Signed)
Patient ID: Betty [name], female    DOB: [birth year], 42 y.o.   MRN: [id number]   Chief Complaint  Patient presents with  . Breast Problem    The patient is a 42 yrs old wf here for left breast pain.  She underwent bilateral mastectomies with implant reconstruction ~ 6 years ago.  She has done well with mild left breast discomfort.  Over the past year the left breast laterally has become very tight and uncomfortable.  The left breast is firm when compared to the right.  The implant is sitting high and there is tenderness to palpation laterally.  There is no sign of infection or seroma.  The left side appears 25 - 50 cc larger. Current implants are 590 cc high profile silicone.   Review of Systems  Constitutional: Negative.  Negative for appetite change and fever.  HENT: Negative.   Eyes: Negative.   Respiratory: Negative.   Cardiovascular: Negative.   Gastrointestinal: Negative.   Endocrine: Negative.  Negative for cold intolerance and heat intolerance.  Genitourinary: Negative.  Negative for flank pain.  Skin: Negative.  Negative for color change and wound.  Neurological: Negative.   Hematological: Negative.   Psychiatric/Behavioral: Negative.     Past Medical History:  Diagnosis Date  . Allergic rhinitis   . 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 GERD (gastroesophageal reflux disease)    HEARTBURN OCCASIONALLY   . Heart murmur    TOLD THIS AS A CHILD, NO PROBLEMS OTHERWISE  . Preeclampsia    DURING PREGNANCY ; REPORTS AS RESOLVED     Past Surgical History:  Procedure Laterality Date  . BREAST RECONSTRUCTION  08/05/2012   Procedure: BREAST RECONSTRUCTION;  Surgeon: Theodoro Kos, DO;  Location: [place];  Service: Plastics;  Laterality: Bilateral;  . INGUINAL HERNIA REPAIR  AS INFANT  . LAPAROSCOPIC SALPINGO OOPHERECTOMY  Bilateral 02/15/2018   Procedure: LAPAROSCOPIC SALPINGO OOPHORECTOMY;  Surgeon: Molli Posey, MD;  Location: [facility];  Service: Gynecology;  Laterality: Bilateral;  . MASTECTOMY W/ SENTINEL NODE BIOPSY  08/05/2012   Procedure: MASTECTOMY WITH SENTINEL LYMPH NODE BIOPSY;  Surgeon: Harl Bowie, MD;  Location: [place];  Service: General;  Laterality: Right;   AND LEFT MASTECTOMY  . PORT-A-CATH REMOVAL  08/05/2012   Procedure: REMOVAL PORT-A-CATH;  Surgeon: Harl Bowie, MD;  Location: [facility];  Service: General;  Laterality: N/A;  . PORTACATH PLACEMENT  03/10/2012   Procedure: INSERTION PORT-A-CATH;  Surgeon: Harl Bowie, MD;  Location: [facility];  Service: General;  Laterality: N/A;  . [place]   - labrum and capsular repair  . TISSUE EXPANDER PLACEMENT  08/05/2012   Procedure: TISSUE EXPANDER;  Surgeon: Theodoro Kos, DO;  Location: [facility];  Service: Plastics;  Laterality: Bilateral;  With Expanders and Flex HD Placement   . TRANSTHORACIC ECHOCARDIOGRAM  03-08-2012   NORMAL LVSF/ EF 60-65%  . VAGINAL BIRTH AFTER CESAREAN SECTION  2018   PREECLAMPSIA       Current Outpatient Medications:  .  fluticasone (FLONASE) 50 MCG/ACT nasal spray, Place 1 spray into both nostrils daily., Disp: , Rfl:  .  ibuprofen (ADVIL) 200 MG tablet, Take 3 tablets (600 mg total) by mouth every 6 (six) hours as needed for moderate pain., Disp: 30 tablet, Rfl: 1 .  NIFEdipine (  PROCARDIA-XL/ADALAT CC) 30 MG 24 hr tablet, Take 1 tablet (30 mg total) by mouth daily., Disp: 20 tablet, Rfl: 1 .  oxycodone-acetaminophen (PERCOCET) 2.5-325 MG tablet, Take 1 tablet by mouth every 4 (four) hours as needed for pain., Disp: 30 tablet, Rfl: 0 .  Prenatal Vit-Fe Fumarate-FA (PRENATAL MULTIVITAMIN) TABS tablet, Take 1 tablet by mouth daily at 12 noon., Disp: , Rfl:    Objective:   There were no vitals filed for this visit.  Physical Exam  Constitutional: She is oriented to  person, place, and time. She appears well-developed and well-nourished.  HENT:  Head: Normocephalic and atraumatic.  Eyes: Pupils are equal, round, and reactive to light. EOM are normal.  Neck: Normal range of motion.  Cardiovascular: Normal rate.  Pulmonary/Chest: Effort normal. No respiratory distress. She exhibits tenderness.  Abdominal: Soft. She exhibits no distension. There is no tenderness.  Neurological: She is alert and oriented to person, place, and time.  Skin: Skin is warm.  Psychiatric: She has a normal mood and affect. Her behavior is normal. Judgment and thought content normal.    Assessment & Plan:  Primary cancer of lower outer quadrant of right female breast ([place]) - Plan: MR BREAST BILATERAL WO CONTRAST  BRCA1 positive - Plan: MR BREAST BILATERAL WO CONTRAST  Acquired absence of breast and absent nipple, bilateral - Plan: MR BREAST BILATERAL WO CONTRAST  Breast pain, left - Plan: MR BREAST BILATERAL WO CONTRAST  Capsular contracture of breast implant, initial encounter  Recommend MRI of breast due to history and diagnoses of breast cancer.  Evaluation of capsule contracture.  Plan for release of capsule contracture of the left breast with placement of a smaller implant.  [place], DO

## 2018-08-11 ENCOUNTER — Other Ambulatory Visit: Payer: Self-pay

## 2018-08-11 DIAGNOSIS — C50511 Malignant neoplasm of lower-outer quadrant of right female breast: Secondary | ICD-10-CM

## 2018-08-11 NOTE — Progress Notes (Signed)
ID: Betty [name]   DOB: 02-05-1976  MR#: [id number]  BTD#:[id number]  Patient Care Team: Darryll Capers, NP as PCP - General (Nurse Practitioner) Jesusita Oka, RN as Registered Nurse [place], Loel Lofty, DO as Attending Physician (Plastic Surgery) Molli Posey, MD as Consulting Physician (Obstetrics and Gynecology)  CHIEF COMPLAINTS:  1)  triple negative Breast Cancer       2) BRCA 1 Positive    HISTORY OF PRESENT ILLNESS:  From the original intake note:  Betty [name] is a 42 year old woman from [facility], BRCA 1 positive, who presented with a painful, palpable mass in her right breast that she first noticed in January 2013. A bilateral mammogram and right breast ultrasound performed on 02/20/2012 showed an irregular hypoechoic mass at 8 o clock position measuring 2.1 x 1.1 x 1.6 cm.  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  MRI scan performed on 02/27/2012 showed this mass to be 2.2 x 1.6 x 1.9 cm. No other masses were seen.  She underwent 4 cycles of FEC treatment from 03/15/12 to 04/27/12 followed by 4 cycles of Taxotere from 05/11/12 to 06/22/12.  She underwent bilateral simple mastectomies with right axillary sentinel lymph node biopsy by Dr. Ninfa Linden on 08/05/12 with complete pathological response.    Subsequent history is as detailed below.   INTERVAL HISTORY:   Betty [name] returns today for follow-up of her BRCA positive breast cancer. She has been doing well overall.   Since her last visit to the office, she underwent a laparoscopic salpingo oophorectomy on 02/15/2018 with pathology [id number]) showing: Ovaries and fallopian tubes, bilateral with right and left ovaries: Benign corpus luteum and bilateral follicular cysts. No endometriosis or evidence of malignancy. Right and left fallopian tubes: Benign paratubal cyst. No endometriosis or evidence of  malignancy.    She is seeing Dr. Tedra Coupe him because the left reconstructed breast feels tight and uncomfortable and she is contemplating capsule release surgery   REVIEW OF SYSTEMS: Betty [name] reports that for exercise, she walks 2 miles 3 days a week. She did well with her recent salpingo oophorectomy with minimal pain. 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 She has a 40 year old daughter, Betty [name]. Her PCP is at [facility] in [place] and she is seen PRN. She sees Dr. Matthew Saras on a regular basis. She denies unusual headaches, visual changes, nausea, vomiting, or dizziness. There has been no unusual cough, phlegm production, or pleurisy. This been no change in bowel or bladder habits. She denies unexplained fatigue or unexplained weight loss, bleeding, rash, or fever. A detailed review of systems was otherwise stable.    PAST MEDICAL HISTORY: Past Medical History:  Diagnosis Date  . Allergic rhinitis   . 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 GERD (gastroesophageal reflux disease)    HEARTBURN OCCASIONALLY   . Heart murmur    TOLD THIS AS A CHILD, NO PROBLEMS OTHERWISE  . Preeclampsia    DURING PREGNANCY ; REPORTS AS RESOLVED     PAST SURGICAL HISTORY: Past Surgical History:  Procedure Laterality Date  . BREAST RECONSTRUCTION  08/05/2012   Procedure: BREAST RECONSTRUCTION;  Surgeon: Theodoro Kos, DO;  Location: [facility];  Service: Plastics;  Laterality: Bilateral;  . INGUINAL HERNIA  REPAIR  AS INFANT  . LAPAROSCOPIC SALPINGO OOPHERECTOMY Bilateral 02/15/2018   Procedure: LAPAROSCOPIC SALPINGO OOPHORECTOMY;  Surgeon: Molli Posey, MD;  Location: [facility];  Service: Gynecology;  Laterality: Bilateral;  . MASTECTOMY W/ SENTINEL NODE BIOPSY  08/05/2012   Procedure: MASTECTOMY WITH SENTINEL LYMPH NODE  BIOPSY;  Surgeon: Harl Bowie, MD;  Location: [facility];  Service: General;  Laterality: Right;   AND LEFT MASTECTOMY  . PORT-A-CATH REMOVAL  08/05/2012   Procedure: REMOVAL PORT-A-CATH;  Surgeon: Harl Bowie, MD;  Location: [place] [facility];  Service: General;  Laterality: N/A;  . PORTACATH PLACEMENT  03/10/2012   Procedure: INSERTION PORT-A-CATH;  Surgeon: Harl Bowie, MD;  [facility];  Service: General;  Laterality: N/A;  . [place]   - labrum and capsular repair  . TISSUE EXPANDER PLACEMENT  08/05/2012   Procedure: TISSUE EXPANDER;  Surgeon: Theodoro Kos, DO;  Location: [place];  Service: Plastics;  Laterality: Bilateral;  With Expanders and Flex HD Placement   . TRANSTHORACIC ECHOCARDIOGRAM  03-08-2012   NORMAL LVSF/ EF 60-65%  . VAGINAL BIRTH AFTER CESAREAN SECTION  2018   PREECLAMPSIA     FAMILY HISTORY Family History  Problem Relation Age of Onset  . Ovarian cancer Mother 35  . Cancer Mother        ovarian  . Allergic rhinitis Unknown        grandmother  . Cancer Paternal Uncle        leukemia  . Cancer Paternal Uncle        lung cancer    GYNECOLOGIC HISTORY:   (Updated 02/13/2014) Periods interrupted with chemotherapy, resumed May 2014, and normal since.  SOCIAL HISTORY:  (Updated 08/12/2018) She works as a Nutritional therapist; previously she was a Pharmacist, hospital for x10 years.  She has one son from an earlier marriage, Betty [name], currently 40 y/o, interested in baseball particularly; 1 daughter from her current marriage, Betty [name], who is currently 40 years old, born October 2018.   HEALTH MAINTENANCE:  (Updated 02/13/2014) Social History   Tobacco Use  . Smoking status: Never Smoker  . Smokeless tobacco: Never Used  Substance Use Topics  . Alcohol use: Yes    Comment: occasionaly   . Drug use: No    PAP:  UTD/ Matthew Saras  Bone density: Never  Lipid panel:  Not on file  Colonoscopy: Never  Allergies  Allergen  Reactions  . Dextromethorphan Rash    Current Outpatient Medications  Medication Sig Dispense Refill  . fluticasone (FLONASE) 50 MCG/ACT nasal spray Place 1 spray into both nostrils daily.    Marland Kitchen ibuprofen (ADVIL) 200 MG tablet Take 3 tablets (600 mg total) by mouth every 6 (six) hours as needed for moderate pain. 30 tablet 1  . NIFEdipine (PROCARDIA-XL/ADALAT CC) 30 MG 24 hr tablet Take 1 tablet (30 mg total) by mouth daily. 20 tablet 1  . oxycodone-acetaminophen (PERCOCET) 2.5-325 MG tablet Take 1 tablet by mouth every 4 (four) hours as needed for pain. 30 tablet 0  . Prenatal Vit-Fe Fumarate-FA (PRENATAL MULTIVITAMIN) TABS tablet Take 1 tablet by mouth daily at 12 noon.     No current facility-administered medications for this visit.     OBJECTIVE: Young white Woman who appears well  Vitals:   08/12/18 0910  BP: 125/76  Pulse: 65  Resp: 18  Temp: 98.2 F (36.8 C)  SpO2: 98%     Body mass index is 29.04  kg/m.    ECOG FS: 0 Filed Weights   08/12/18 0910  Weight: 202 lb 6.4 oz (91.8 kg)   Sclerae unicteric, EOMs intact Oropharynx clear and moist No cervical or supraclavicular adenopathy Lungs no rales or rhonchi Heart regular rate and rhythm Abd soft, nontender, positive bowel sounds MSK no focal spinal tenderness, no upper extremity lymphedema Neuro: nonfocal, well oriented, appropriate affect Breasts: Status post bilateral mastectomies with bilateral silicone implant reconstruction.  The left reconstructed breast is considerably tighter than the right.  There is no evidence of local recurrence.  Both axillae are benign.  LAB RESULTS: Lab Results  Component Value Date   WBC 4.9 08/12/2018   NEUTROABS 2.3 08/12/2018   HGB 14.4 08/12/2018   HCT 44.7 08/12/2018   MCV 87.1 08/12/2018   PLT 253 08/12/2018      Chemistry      Component Value Date/Time   NA 138 08/12/2018 0851   NA 137 08/04/2017 1602   K 4.1 08/12/2018 0851   K 3.8 08/04/2017 1602   CL 103  08/12/2018 0851   CL 104 12/20/2012 1006   CO2 25 08/12/2018 0851   CO2 20 (L) 08/04/2017 1602   BUN 18 08/12/2018 0851   BUN 14.6 08/04/2017 1602   CREATININE 0.87 08/12/2018 0851   CREATININE 0.8 08/04/2017 1602      Component Value Date/Time   CALCIUM 10.0 08/12/2018 0851   CALCIUM 9.9 08/04/2017 1602   ALKPHOS 82 08/12/2018 0851   ALKPHOS 88 08/04/2017 1602   AST 22 08/12/2018 0851   AST 21 08/04/2017 1602   ALT 16 08/12/2018 0851   ALT 22 08/04/2017 1602   BILITOT 0.7 08/12/2018 0851   BILITOT 0.30 08/04/2017 1602      STUDIES: Pathology from recent bilateral salpingo-oophorectomy discussed with the patient  ASSESSMENT: 42 y.o. BRCA1 positive [[id number]] [place] woman:  #1  S/p biopsy of the right breast lower outer quadrant on 02/20/12 showing a clinical T2, N0, M0, stage IIA  Invasive ductal carcinoma, grade 3, triple negative, with a Ki67 of 98%  #2  Completed 4 cycles of fluorouracil, epirubicin and cyclophosphamide from 03/15/12 to 04/27/12 followed by 4 cycles of docetaxel from 05/11/12 to 06/22/12.  #3  Bilateral simple mastectomies with right axillary sentinel lymph node biopsy on 08/05/12 showed a complete pathological response.  (a) bilateral implant reconstruction 03/02/2013 with  Left - [employer] Smooth Round High Profile Gel 590cc. Ref #[id number].  Serial Number [id number] Right - [employer] Smooth Round High Profile Gel 590cc. Ref #[id number].  Serial Number [id number]  #4 BSO 02/15/2018, with benign pathology  PLAN:  Aujanae is now 6 years out from definitive surgery for her breast cancer with no evidence of disease recurrence.  This is very favorable.  We discussed the fact that for estrogen receptor breast cancer, if recurrence does not occur on the first few years, drops considerably.  Accordingly I feel comfortable releasing her to her primary care physician at this point.  The family members who wish to be tested have been tested.  Lenita tells me her  mother is being seen at [facility] and the doctor there has insisted that in addition to bilateral salpingo-oophorectomy she needs a hysterectomy.  There may be another indication for that in that case but I am not aware of her needing to have a hysterectomy because of BRCA1 positivity.  I am going to check with our genetics counselors in any case to make sure  Course she is at risk of  primary peritoneal tumor and pancreatic cancer but we have no screening test for either of those and fortunately the risk is quite low.  At this point I feel comfortable releasing Neil to her primary care physician's care.  All she needs in terms of breast cancer follow-up is a yearly physician chest wall exam  I will be glad to see Sreya again at any point in the future if and when the need arises but as of now we are making no further routine appointment for her here.     Magrinat, Virgie Dad, MD  08/12/18 9:46 AM Medical Oncology and Hematology [facility] [address] [place], [place] [address] Tel. [phone]    Fax. [phone]    I, Soijett Blue am acting as scribe for Dr. Sarajane Jews C. Magrinat.  I, Lurline Del MD, have reviewed the above documentation for accuracy and completeness, and I agree with the above.  Addendum: I reviewed the data on BRCA1 and endometrial cancer and sent lowering a letter regarding that.  While there are some hints, there is not enough data for [place] to recommend that she have a hysterectomy in the absence of any other indications.

## 2018-08-11 NOTE — Addendum Note (Signed)
Addended by: Wallace Going on: 08/11/2018 10:47 AM   Modules accepted: Orders

## 2018-08-11 NOTE — Addendum Note (Signed)
Addended by: Wallace Going on: 08/11/2018 10:44 AM   Modules accepted: Orders

## 2018-08-12 ENCOUNTER — Other Ambulatory Visit: Payer: Self-pay | Admitting: Plastic Surgery

## 2018-08-12 ENCOUNTER — Inpatient Hospital Stay: Payer: BC Managed Care – PPO

## 2018-08-12 ENCOUNTER — Telehealth: Payer: Self-pay | Admitting: Oncology

## 2018-08-12 ENCOUNTER — Encounter: Payer: Self-pay | Admitting: Oncology

## 2018-08-12 ENCOUNTER — Inpatient Hospital Stay: Payer: BC Managed Care – PPO | Attending: Oncology | Admitting: Oncology

## 2018-08-12 VITALS — BP 125/76 | HR 65 | Temp 98.2°F | Resp 18 | Ht 70.0 in | Wt 202.4 lb

## 2018-08-12 DIAGNOSIS — Z1509 Genetic susceptibility to other malignant neoplasm: Secondary | ICD-10-CM

## 2018-08-12 DIAGNOSIS — K219 Gastro-esophageal reflux disease without esophagitis: Secondary | ICD-10-CM | POA: Diagnosis not present

## 2018-08-12 DIAGNOSIS — Z79899 Other long term (current) drug therapy: Secondary | ICD-10-CM

## 2018-08-12 DIAGNOSIS — C50511 Malignant neoplasm of lower-outer quadrant of right female breast: Secondary | ICD-10-CM

## 2018-08-12 DIAGNOSIS — Z853 Personal history of malignant neoplasm of breast: Secondary | ICD-10-CM

## 2018-08-12 DIAGNOSIS — Z1501 Genetic susceptibility to malignant neoplasm of breast: Secondary | ICD-10-CM | POA: Diagnosis not present

## 2018-08-12 DIAGNOSIS — Z806 Family history of leukemia: Secondary | ICD-10-CM | POA: Diagnosis not present

## 2018-08-12 DIAGNOSIS — R011 Cardiac murmur, unspecified: Secondary | ICD-10-CM | POA: Diagnosis not present

## 2018-08-12 DIAGNOSIS — N644 Mastodynia: Secondary | ICD-10-CM

## 2018-08-12 DIAGNOSIS — Z90722 Acquired absence of ovaries, bilateral: Secondary | ICD-10-CM | POA: Insufficient documentation

## 2018-08-12 DIAGNOSIS — Z8041 Family history of malignant neoplasm of ovary: Secondary | ICD-10-CM | POA: Insufficient documentation

## 2018-08-12 DIAGNOSIS — Z9013 Acquired absence of bilateral breasts and nipples: Secondary | ICD-10-CM

## 2018-08-12 DIAGNOSIS — Z801 Family history of malignant neoplasm of trachea, bronchus and lung: Secondary | ICD-10-CM | POA: Diagnosis not present

## 2018-08-12 DIAGNOSIS — T8544XA Capsular contracture of breast implant, initial encounter: Secondary | ICD-10-CM

## 2018-08-12 LAB — CBC WITH DIFFERENTIAL (CANCER CENTER ONLY)
Abs Immature Granulocytes: 0.01 10*3/uL (ref 0.00–0.07)
BASOS ABS: 0 10*3/uL (ref 0.0–0.1)
BASOS PCT: 0 %
EOS ABS: 0.3 10*3/uL (ref 0.0–0.5)
Eosinophils Relative: 6 %
HEMATOCRIT: 44.7 % (ref 36.0–46.0)
Hemoglobin: 14.4 g/dL (ref 12.0–15.0)
IMMATURE GRANULOCYTES: 0 %
Lymphocytes Relative: 40 %
Lymphs Abs: 2 10*3/uL (ref 0.7–4.0)
MCH: 28.1 pg (ref 26.0–34.0)
MCHC: 32.2 g/dL (ref 30.0–36.0)
MCV: 87.1 fL (ref 80.0–100.0)
Monocytes Absolute: 0.3 10*3/uL (ref 0.1–1.0)
Monocytes Relative: 7 %
NEUTROS PCT: 47 %
NRBC: 0 % (ref 0.0–0.2)
Neutro Abs: 2.3 10*3/uL (ref 1.7–7.7)
PLATELETS: 253 10*3/uL (ref 150–400)
RBC: 5.13 MIL/uL — AB (ref 3.87–5.11)
RDW: 13.1 % (ref 11.5–15.5)
WBC: 4.9 10*3/uL (ref 4.0–10.5)

## 2018-08-12 LAB — CMP (CANCER CENTER ONLY)
ALT: 16 U/L (ref 0–44)
ANION GAP: 10 (ref 5–15)
AST: 22 U/L (ref 15–41)
Albumin: 4.4 g/dL (ref 3.5–5.0)
Alkaline Phosphatase: 82 U/L (ref 38–126)
BUN: 18 mg/dL (ref 6–20)
CALCIUM: 10 mg/dL (ref 8.9–10.3)
CO2: 25 mmol/L (ref 22–32)
CREATININE: 0.87 mg/dL (ref 0.44–1.00)
Chloride: 103 mmol/L (ref 98–111)
Glucose, Bld: 80 mg/dL (ref 70–99)
Potassium: 4.1 mmol/L (ref 3.5–5.1)
Sodium: 138 mmol/L (ref 135–145)
TOTAL PROTEIN: 7.8 g/dL (ref 6.5–8.1)
Total Bilirubin: 0.7 mg/dL (ref 0.3–1.2)

## 2018-08-12 NOTE — Telephone Encounter (Signed)
No 10/10 los orders.

## 2018-08-18 ENCOUNTER — Other Ambulatory Visit: Payer: BC Managed Care – PPO

## 2018-08-18 ENCOUNTER — Ambulatory Visit
Admission: RE | Admit: 2018-08-18 | Discharge: 2018-08-18 | Disposition: A | Discharge status: Home / Self Care | Payer: BC Managed Care – PPO | Source: Ambulatory Visit | Attending: Plastic Surgery | Admitting: Plastic Surgery

## 2018-08-18 DIAGNOSIS — Z1509 Genetic susceptibility to other malignant neoplasm: Secondary | ICD-10-CM

## 2018-08-18 DIAGNOSIS — Z9013 Acquired absence of bilateral breasts and nipples: Secondary | ICD-10-CM

## 2018-08-18 DIAGNOSIS — Z1501 Genetic susceptibility to malignant neoplasm of breast: Secondary | ICD-10-CM

## 2018-08-18 DIAGNOSIS — N644 Mastodynia: Secondary | ICD-10-CM

## 2018-08-18 DIAGNOSIS — C50511 Malignant neoplasm of lower-outer quadrant of right female breast: Secondary | ICD-10-CM

## 2018-08-18 DIAGNOSIS — T8544XA Capsular contracture of breast implant, initial encounter: Secondary | ICD-10-CM

## 2018-08-19 NOTE — Addendum Note (Signed)
Addended by: Wallace Going on: 08/19/2018 07:51 AM   Modules accepted: Orders

## 2018-09-09 ENCOUNTER — Other Ambulatory Visit: Payer: Self-pay | Admitting: Physician Assistant

## 2018-09-09 DIAGNOSIS — N644 Mastodynia: Secondary | ICD-10-CM

## 2018-09-25 ENCOUNTER — Ambulatory Visit
Admission: RE | Admit: 2018-09-25 | Discharge: 2018-09-25 | Disposition: A | Discharge status: Home / Self Care | Payer: BC Managed Care – PPO | Source: Ambulatory Visit | Attending: Plastic Surgery | Admitting: Plastic Surgery

## 2018-09-25 MED ORDER — GADOBUTROL 1 MMOL/ML IV SOLN
8.0000 mL | Freq: Once | INTRAVENOUS | Status: AC | PRN
  Administered 2018-09-25: 8 mL via INTRAVENOUS

## 2018-10-12 ENCOUNTER — Other Ambulatory Visit: Payer: Self-pay

## 2018-10-15 ENCOUNTER — Ambulatory Visit (INDEPENDENT_AMBULATORY_CARE_PROVIDER_SITE_OTHER): Payer: Self-pay | Admitting: Plastic Surgery

## 2018-10-15 ENCOUNTER — Encounter: Payer: Self-pay | Admitting: Plastic Surgery

## 2018-10-15 ENCOUNTER — Encounter: Payer: BC Managed Care – PPO | Admitting: Plastic Surgery

## 2018-10-15 VITALS — BP 115/78 | HR 72 | Ht 70.0 in | Wt 186.0 lb

## 2018-10-15 DIAGNOSIS — Z9013 Acquired absence of bilateral breasts and nipples: Secondary | ICD-10-CM

## 2018-10-15 DIAGNOSIS — N644 Mastodynia: Secondary | ICD-10-CM

## 2018-10-15 DIAGNOSIS — Z9889 Other specified postprocedural states: Secondary | ICD-10-CM | POA: Insufficient documentation

## 2018-10-15 NOTE — Progress Notes (Signed)
   Subjective:    Patient ID: Betty [name], female    DOB: 06/27/1976, 42 y.o.   MRN: [id number]  Betty [name] is a 42 year old female who is well-known to the practice here for history and physical for revision of her left breast reconstruction.  She underwent mastectomies bilaterally with reconstruction using expanders followed by silicone implants.  Over the past couple of years she is noticed some contracture of the left breast.  The imaging including MRI has all been negative for anything that is concerning.  It is been tender and a little uncomfortable.  There has not been any sign of infection.  She actually looks really good.  She is happy with the aesthetic result.  There is been no recent change in her health.  She has lost 14 pounds and is doing much better with her weight and diet     Review of Systems  Constitutional: Negative.   HENT: Negative.   Eyes: Negative.   Respiratory: Negative.   Cardiovascular: Negative.   Gastrointestinal: Negative.   Endocrine: Negative.   Genitourinary: Negative.   Musculoskeletal: Negative.   Skin: Negative.   Neurological: Negative.   Hematological: Negative.   Psychiatric/Behavioral: Negative.        Objective:   Physical Exam HENT:     Head: Normocephalic and atraumatic.  Neck:     Musculoskeletal: Normal range of motion.  Cardiovascular:     Rate and Rhythm: Normal rate.  Pulmonary:     Effort: Pulmonary effort is normal.  Neurological:     General: No focal deficit present.     Mental Status: She is alert.  Psychiatric:        Mood and Affect: Mood normal.        Thought Content: Thought content normal.        Judgment: Judgment normal.        Assessment & Plan:  Breast pain, left  S/P breast reconstruction, bilateral  S/P mastectomy, bilateral After a long discussion about the risks and complications the decision was jointly made to hold off on surgery for right now.  We understand that it is uncomfortable for the  capsular contracture on the left however it is manageable.  The concern is that we could make it worse.  We can always opt to do the surgery certainly if it gets worse.

## 2018-10-20 ENCOUNTER — Encounter: Payer: Self-pay | Admitting: Oncology

## 2018-10-20 ENCOUNTER — Other Ambulatory Visit: Payer: Self-pay | Admitting: Oncology

## 2018-10-21 ENCOUNTER — Ambulatory Visit (HOSPITAL_BASED_OUTPATIENT_CLINIC_OR_DEPARTMENT_OTHER): Admission: RE | Admit: 2018-10-21 | Payer: BC Managed Care – PPO | Source: Home / Self Care | Admitting: Plastic Surgery

## 2018-10-21 SURGERY — CAPSULECTOMY, BREAST
Anesthesia: General | Laterality: Left

## 2018-10-29 ENCOUNTER — Encounter: Payer: BC Managed Care – PPO | Admitting: Plastic Surgery

## 2018-12-24 ENCOUNTER — Ambulatory Visit: Payer: BC Managed Care – PPO | Admitting: Plastic Surgery

## 2018-12-24 ENCOUNTER — Encounter: Payer: Self-pay | Admitting: Plastic Surgery

## 2018-12-24 VITALS — BP 132/78 | HR 65 | Ht 70.0 in | Wt 184.0 lb

## 2018-12-24 DIAGNOSIS — Z9889 Other specified postprocedural states: Secondary | ICD-10-CM | POA: Diagnosis not present

## 2018-12-24 DIAGNOSIS — Z9013 Acquired absence of bilateral breasts and nipples: Secondary | ICD-10-CM

## 2018-12-24 DIAGNOSIS — C50511 Malignant neoplasm of lower-outer quadrant of right female breast: Secondary | ICD-10-CM | POA: Diagnosis not present

## 2018-12-24 DIAGNOSIS — N644 Mastodynia: Secondary | ICD-10-CM

## 2018-12-24 NOTE — Progress Notes (Signed)
   Subjective:    Patient ID: Betty [name], female    DOB: 1975-12-18, 43 y.o.   MRN: [id number]  Betty [name] is a 43 year old white female here for further evaluation of her bilateral breast reconstruction.  Overall she is doing well.  There has been no change since her last exam.  She has a much firmer left breast and likely capsule formation on the left.  At one point we were planning on surgical intervention but she postponed it.  She is happy with the way that it looks.  It is more firm than the other side.  Slightly tender but no other concern.  It appears to be capsular contracture on the left.  There is no distortion.  She does have intermittent pain.  And it is firm.  She still does not want to have any intervention for it at this time.  Her nipple areola tattoos have faded significantly.  She is interested in getting a tattoos for the nipple areola.  .   Review of Systems  Constitutional: Negative.  Negative for activity change and appetite change.  HENT: Negative.   Eyes: Negative.   Respiratory: Negative.  Negative for chest tightness.   Cardiovascular: Negative.   Gastrointestinal: Negative.   Endocrine: Negative.   Genitourinary: Negative.   Musculoskeletal: Negative.   Skin: Negative for wound.       Objective:   Physical Exam Vitals signs and nursing note reviewed.  Constitutional:      Appearance: Normal appearance.  HENT:     Head: Normocephalic and atraumatic.  Cardiovascular:     Rate and Rhythm: Normal rate.     Pulses: Normal pulses.  Pulmonary:     Effort: Pulmonary effort is normal.  Abdominal:     General: Abdomen is flat. There is no distension.  Neurological:     General: No focal deficit present.     Mental Status: She is alert.  Psychiatric:        Mood and Affect: Mood normal.        Thought Content: Thought content normal.        Assessment & Plan:  Primary cancer of lower outer quadrant of right female breast ([place])  S/P breast  reconstruction, bilateral  S/P mastectomy, bilateral  Breast pain, left No intervention on the left breast at this time.  Follow-up in 1 year for the breast reconstruction.  In the meantime she would like to move ahead with nipple areole a tattoo.  I think this is a good idea.

## 2019-12-23 ENCOUNTER — Ambulatory Visit: Payer: BC Managed Care – PPO | Admitting: Plastic Surgery

## 2020-01-03 ENCOUNTER — Ambulatory Visit: Payer: BC Managed Care – PPO | Admitting: Plastic Surgery

## 2020-01-31 ENCOUNTER — Ambulatory Visit: Payer: BC Managed Care – PPO | Admitting: Plastic Surgery

## 2020-01-31 ENCOUNTER — Encounter: Payer: Self-pay | Admitting: Plastic Surgery

## 2020-01-31 ENCOUNTER — Other Ambulatory Visit: Payer: Self-pay

## 2020-01-31 VITALS — BP 131/80 | HR 60 | Temp 97.3°F | Ht 70.0 in | Wt 203.8 lb

## 2020-01-31 DIAGNOSIS — Z9013 Acquired absence of bilateral breasts and nipples: Secondary | ICD-10-CM

## 2020-01-31 DIAGNOSIS — Z9889 Other specified postprocedural states: Secondary | ICD-10-CM | POA: Diagnosis not present

## 2020-01-31 NOTE — Progress Notes (Signed)
Patient ID: Betty [name], female    DOB: August 16, 1976, 44 y.o.   MRN: [id number]   Chief Complaint  Patient presents with  . Follow-up    cancer of lower quadrant of right female breast  . Breast Problem    Betty [name] is a 44 year old white female here for a yearly follow-up on her breast reconstruction.  When the patient was in her early 44s she noted a lump on her left breast.  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  She underwent neoadjuvant chemotherapy which finished in August 2013.  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  At the time she was a 44 A bra size.  She is 5 feet 10 inches tall.  Today she is 203 pounds.  At the time of her original surgery she was ~ 170 pounds.  She underwent bilateral mastectomies with implant-based reconstruction.  She has Mentor Smooth Round High (ultra) profile 590 cc gel implants.  A few years ago she noticed some firmness of the left breast capsule.  A work-up was done and found to be negative.  She still has that firmness.  On a regular basis it does not bother her.  Once in a while in the evening she notices it.  It does not seem to be getting worse.  She is otherwise doing very well as well as her two kids and mom.  Her incisions are still well-healed.  The left is slightly wider than the right.  No sign of infection.  On palpation no areas of concern.  She also has fading of her nipple areolar tattoo.  This does not bother her and she is not interested in having it redone at this time.   Review of Systems  Constitutional: Negative.  Negative for activity change and appetite change.  HENT: Negative.   Eyes: Negative.   Respiratory: Positive for chest tightness. Negative for shortness of breath.   Cardiovascular: Negative for leg swelling.  Gastrointestinal: Negative for abdominal distention and abdominal pain.  Endocrine: Negative.   Genitourinary: Negative.     Musculoskeletal: Negative.   Neurological: Negative.   Hematological: Negative.   Psychiatric/Behavioral: Negative.     Past Medical History:  Diagnosis Date  . Allergic rhinitis   . Breast cancer, BRCA1 positive ([place]) RIGHT INVASIVE DUCTAL CANCER---  S/P BILATERAL MASTECTOMY W/ RECONSTRUCTION  08-05-2012--  CHEMO ENEDED 06-22-2012   ONCOLOGIST- DR Jana Hakim; chemo  . GERD (gastroesophageal reflux disease)    HEARTBURN OCCASIONALLY   . Heart murmur    TOLD THIS AS A CHILD, NO PROBLEMS OTHERWISE  . Preeclampsia    DURING PREGNANCY ; REPORTS AS RESOLVED     Past Surgical History:  Procedure Laterality Date  . BREAST RECONSTRUCTION  08/05/2012   Procedure: BREAST RECONSTRUCTION;  Surgeon: Theodoro Kos, DO;  Location: [place];  Service: Plastics;  Laterality: Bilateral;  . INGUINAL HERNIA REPAIR  AS INFANT  . LAPAROSCOPIC SALPINGO OOPHERECTOMY Bilateral 02/15/2018   Procedure: LAPAROSCOPIC SALPINGO OOPHORECTOMY;  Surgeon: Molli Posey, MD;  Location: [facility];  Service: Gynecology;  Laterality: Bilateral;  . MASTECTOMY W/ SENTINEL NODE BIOPSY  08/05/2012   Procedure: MASTECTOMY WITH SENTINEL LYMPH NODE BIOPSY;  Surgeon: Harl Bowie, MD;  Location: [place];  Service: General;  Laterality: Right;   AND LEFT MASTECTOMY  . PORT-A-CATH REMOVAL  08/05/2012   Procedure: REMOVAL PORT-A-CATH;  Surgeon: Harl Bowie, MD;  Location: [place];  Service: General;  Laterality: N/A;  . PORTACATH PLACEMENT  03/10/2012   Procedure: INSERTION PORT-A-CATH;  Surgeon: Harl Bowie, MD;  Location: [facility];  Service: General;  Laterality: N/A;  . [place]   - labrum and capsular repair  . TISSUE EXPANDER PLACEMENT  08/05/2012   Procedure: TISSUE EXPANDER;  Surgeon: Theodoro Kos, DO;  Location: [place];  Service: Plastics;  Laterality: Bilateral;  With Expanders and Flex HD Placement   . TRANSTHORACIC ECHOCARDIOGRAM  03-08-2012   NORMAL LVSF/ EF 60-65%  .  VAGINAL BIRTH AFTER CESAREAN SECTION  2018   PREECLAMPSIA       Current Outpatient Medications:  .  fluticasone (FLONASE) 50 MCG/ACT nasal spray, Place 1 spray into both nostrils daily., Disp: , Rfl:  .  ibuprofen (ADVIL) 200 MG tablet, Take 3 tablets (600 mg total) by mouth every 6 (six) hours as needed for moderate pain., Disp: 30 tablet, Rfl: 1   Objective:   Vitals:   01/31/20 0850  BP: 131/80  Pulse: 60  Temp: (!) 97.3 F (36.3 C)  SpO2: 99%    Physical Exam Vitals and nursing note reviewed.  Constitutional:      Appearance: Normal appearance.  HENT:     Head: Normocephalic and atraumatic.  Eyes:     Extraocular Movements: Extraocular movements intact.  Cardiovascular:     Rate and Rhythm: Normal rate.     Pulses: Normal pulses.  Pulmonary:     Effort: Pulmonary effort is normal. No respiratory distress.  Abdominal:     General: Abdomen is flat. There is no distension.     Tenderness: There is no abdominal tenderness.  Skin:    General: Skin is warm.     Capillary Refill: Capillary refill takes less than 2 seconds.  Neurological:     General: No focal deficit present.     Mental Status: She is alert and oriented to person, place, and time.  Psychiatric:        Mood and Affect: Mood normal.        Behavior: Behavior normal.        Thought Content: Thought content normal.     Assessment & Plan:  S/P mastectomy, bilateral  S/P breast reconstruction, bilateral  Continue with light massage to the implants and see the breast tissue.  If she is interested in ultrasound for implant screening we can do that at any time.  In general that can be done every 3 years.  She is not interested in nipple areola tattoo at this time.  I would like to see her back in 1 year.  Pictures were obtained of the patient and placed in the chart with the patient's or guardian's permission.   Wallace Going, DO   The 21st Century Cures Act was signed into law in 2016 which  includes the topic of electronic health records.  This provides immediate access to information in MyChart.  This includes consultation notes, operative notes, office notes, lab results and pathology reports.  If you have any questions about what you read please let us know at your next visit or call us at the office.  We are right here with you.

## 2021-01-29 ENCOUNTER — Ambulatory Visit: Payer: BC Managed Care – PPO | Admitting: Plastic Surgery

## 2022-11-28 ENCOUNTER — Other Ambulatory Visit: Payer: Self-pay | Admitting: Surgery

## 2022-12-02 ENCOUNTER — Other Ambulatory Visit: Payer: Self-pay | Admitting: Surgery

## 2022-12-02 DIAGNOSIS — N6323 Unspecified lump in the left breast, lower outer quadrant: Secondary | ICD-10-CM

## 2022-12-05 ENCOUNTER — Ambulatory Visit
Admission: RE | Admit: 2022-12-05 | Discharge: 2022-12-05 | Disposition: A | Discharge status: Home / Self Care | Payer: BC Managed Care – PPO | Source: Ambulatory Visit | Attending: Surgery | Admitting: Surgery

## 2022-12-05 ENCOUNTER — Other Ambulatory Visit: Payer: Self-pay | Admitting: Surgery

## 2022-12-05 DIAGNOSIS — N6323 Unspecified lump in the left breast, lower outer quadrant: Secondary | ICD-10-CM

## 2022-12-15 ENCOUNTER — Encounter: Payer: Self-pay | Admitting: Plastic Surgery

## 2022-12-15 ENCOUNTER — Ambulatory Visit: Payer: BC Managed Care – PPO | Admitting: Plastic Surgery

## 2022-12-15 DIAGNOSIS — Z1509 Genetic susceptibility to other malignant neoplasm: Secondary | ICD-10-CM | POA: Diagnosis not present

## 2022-12-15 DIAGNOSIS — Z9013 Acquired absence of bilateral breasts and nipples: Secondary | ICD-10-CM

## 2022-12-15 DIAGNOSIS — N644 Mastodynia: Secondary | ICD-10-CM

## 2022-12-15 DIAGNOSIS — Z1501 Genetic susceptibility to malignant neoplasm of breast: Secondary | ICD-10-CM

## 2022-12-15 DIAGNOSIS — N6323 Unspecified lump in the left breast, lower outer quadrant: Secondary | ICD-10-CM

## 2022-12-15 DIAGNOSIS — N6320 Unspecified lump in the left breast, unspecified quadrant: Secondary | ICD-10-CM | POA: Insufficient documentation

## 2022-12-15 NOTE — Progress Notes (Signed)
Patient ID: Betty [name], female    DOB: 06-25-1976, 47 y.o.   MRN: [id number]   Chief Complaint  Patient presents with   Follow-up   Breast Problem    The patient is a 47 year old female here for follow-up on her breast reconstruction.  She originally had felt a lump on the left breast and underwent a workup which included a mammogram, ultrasound, biopsy and MRI.  The biopsy showed a triple negative breast cancer.  She was also found to be BRCA 1 positive.  She underwent immediate bilateral breast reconstruction after mastectomies on 08/06/2012.  She then had exchange to Mentor smooth round ultra high-profile gel 590 cc implants March 02, 2013.  She is 5 feet 10 inches tall.  Had some challenges with the left side with the capsule just being a bit firmer.  She was seen by Dr. Ninfa Linden a few weeks ago and found to have possible extracapsular silicone mass in the lower portion of the left breast by mammogram and ultrasound from 12/05/2022.  Being that the patient is 10 years out is perfectly reasonable to go ahead and exchange the implants for new ones and remove the silicone mass.      Review of Systems  Constitutional: Negative.   HENT: Negative.    Eyes: Negative.   Respiratory: Negative.  Negative for chest tightness and shortness of breath.   Cardiovascular: Negative.   Gastrointestinal: Negative.   Endocrine: Negative.   Genitourinary: Negative.   Musculoskeletal: Negative.   Neurological: Negative.     Past Medical History:  Diagnosis Date   Allergic rhinitis    Breast cancer, BRCA1 positive ([place]) RIGHT INVASIVE DUCTAL CANCER---  S/P BILATERAL MASTECTOMY W/ RECONSTRUCTION  08-05-2012--  CHEMO ENEDED 06-22-2012   ONCOLOGIST- DR Jana Hakim; chemo   GERD (gastroesophageal reflux disease)    HEARTBURN OCCASIONALLY    Heart murmur    TOLD THIS AS A CHILD, NO PROBLEMS OTHERWISE   Preeclampsia    DURING PREGNANCY ; REPORTS AS RESOLVED     Past Surgical History:  Procedure  Laterality Date   BREAST RECONSTRUCTION  08/05/2012   Procedure: BREAST RECONSTRUCTION;  Surgeon: Theodoro Kos, DO;  Location: [place];  Service: Plastics;  Laterality: Bilateral;   INGUINAL HERNIA REPAIR  AS INFANT   LAPAROSCOPIC SALPINGO OOPHERECTOMY Bilateral 02/15/2018   Procedure: LAPAROSCOPIC SALPINGO OOPHORECTOMY;  Surgeon: Molli Posey, MD;  Location: [facility];  Service: Gynecology;  Laterality: Bilateral;   MASTECTOMY W/ SENTINEL NODE BIOPSY  08/05/2012   Procedure: MASTECTOMY WITH SENTINEL LYMPH NODE BIOPSY;  Surgeon: Harl Bowie, MD;  Location: [place];  Service: General;  Laterality: Right;   AND LEFT MASTECTOMY   PORT-A-CATH REMOVAL  08/05/2012   Procedure: REMOVAL PORT-A-CATH;  Surgeon: Harl Bowie, MD;  Location: [place];  Service: General;  Laterality: N/A;   PORTACATH PLACEMENT  03/10/2012   Procedure: INSERTION PORT-A-CATH;  Surgeon: Harl Bowie, MD;  Location: [facility];  Service: General;  Laterality: N/A;   [place]   - labrum and capsular repair   TISSUE EXPANDER PLACEMENT  08/05/2012   Procedure: TISSUE EXPANDER;  Surgeon: Theodoro Kos, DO;  Location: [place];  Service: Plastics;  Laterality: Bilateral;  With Expanders and Flex HD Placement    TRANSTHORACIC ECHOCARDIOGRAM  03-08-2012   NORMAL LVSF/ EF 60-65%   VAGINAL BIRTH AFTER CESAREAN SECTION  2018   PREECLAMPSIA       Current Outpatient Medications:  fluticasone (FLONASE) 50 MCG/ACT nasal spray, Place 1 spray into both nostrils daily., Disp: , Rfl:    ibuprofen (ADVIL) 200 MG tablet, Take 3 tablets (600 mg total) by mouth every 6 (six) hours as needed for moderate pain., Disp: 30 tablet, Rfl: 1   Objective:   There were no vitals filed for this visit.  Physical Exam Constitutional:      Appearance: Normal appearance.  HENT:     Head: Normocephalic and atraumatic.  Cardiovascular:     Rate and Rhythm: Normal rate.     Pulses: Normal pulses.   Pulmonary:     Effort: Pulmonary effort is normal.  Musculoskeletal:        General: No swelling or deformity.  Skin:    General: Skin is warm.     Capillary Refill: Capillary refill takes less than 2 seconds.     Coloration: Skin is not jaundiced.     Findings: Lesion present. No bruising.  Neurological:     Mental Status: She is alert and oriented to person, place, and time.  Psychiatric:        Mood and Affect: Mood normal.        Behavior: Behavior normal.        Thought Content: Thought content normal.        Judgment: Judgment normal.     Assessment & Plan:  BRCA1 positive  Acquired absence of breast and absent nipple, bilateral  Breast pain, left  Mass of lower outer quadrant of left breast  The patient is in agreement for removal of bilateral implants and exchange.  We will plan to place the same ones.  We will also remove the silicone and send it for a biopsy.  Pictures were obtained of the patient and placed in the chart with the patient's or guardian's permission.  Harris, DO

## 2023-01-14 ENCOUNTER — Ambulatory Visit: Payer: Self-pay | Admitting: *Deleted

## 2023-01-14 ENCOUNTER — Encounter: Payer: Self-pay | Admitting: Gastroenterology

## 2023-01-14 VITALS — Ht 70.0 in | Wt 158.0 lb

## 2023-01-14 DIAGNOSIS — Z1211 Encounter for screening for malignant neoplasm of colon: Secondary | ICD-10-CM

## 2023-01-14 MED ORDER — NA SULFATE-K SULFATE-MG SULF 17.5-3.13-1.6 GM/177ML PO SOLN: 1.0000 | Freq: Once | ORAL | 0 refills | Status: AC

## 2023-01-14 NOTE — Progress Notes (Signed)
No egg or soy allergy known to patient  No issues known to pt with past sedation with any surgeries or procedures Patient denies ever being told they had issues or difficulty with intubation  No FH of Malignant Hyperthermia Pt is not on diet pills Pt is not on  home 02  Pt is not on blood thinners  Pt denies issues with constipation  Pt is not on dialysis Pt denies any upcoming cardiac testing Pt encouraged to use to use Singlecare or Goodrx to reduce cost  Patient's chart reviewed by Osvaldo Angst CNRA prior to previsit and patient appropriate for the [employer].  Previsit completed and red dot placed by patient's name on their procedure day (on provider's schedule).  . Visit by phone Pt states wt is : 158 lb Instructions reviewed with pt and pt states understanding. Instructed to review again prior to procedure. Pt states they will.  Instructions sent by mail with coupon and by my chart

## 2023-01-23 ENCOUNTER — Telehealth: Payer: Self-pay | Admitting: *Deleted

## 2023-01-23 NOTE — Telephone Encounter (Signed)
[employer] [place] PPA List - no pac req for [place], [id number]

## 2023-02-04 ENCOUNTER — Encounter (HOSPITAL_BASED_OUTPATIENT_CLINIC_OR_DEPARTMENT_OTHER): Payer: Self-pay | Admitting: Plastic Surgery

## 2023-02-06 ENCOUNTER — Ambulatory Visit (INDEPENDENT_AMBULATORY_CARE_PROVIDER_SITE_OTHER): Payer: BC Managed Care – PPO | Admitting: Physician Assistant

## 2023-02-06 ENCOUNTER — Encounter: Payer: Self-pay | Admitting: Physician Assistant

## 2023-02-06 VITALS — BP 131/80 | HR 66 | Ht 70.0 in | Wt 162.5 lb

## 2023-02-06 DIAGNOSIS — Z9889 Other specified postprocedural states: Secondary | ICD-10-CM

## 2023-02-06 MED ORDER — ONDANSETRON 4 MG PO TBDP: 4.0000 mg | ORAL_TABLET | Freq: Three times a day (TID) | ORAL | 0 refills | Status: AC | PRN

## 2023-02-06 MED ORDER — CEPHALEXIN 500 MG PO CAPS: 500.0000 mg | ORAL_CAPSULE | Freq: Four times a day (QID) | ORAL | 0 refills | Status: AC

## 2023-02-06 MED ORDER — OXYCODONE HCL 5 MG PO TABS: 5.0000 mg | ORAL_TABLET | Freq: Three times a day (TID) | ORAL | 0 refills | Status: AC | PRN

## 2023-02-06 NOTE — Progress Notes (Signed)
   Patient ID: Betty [name], female    DOB: 09/22/1976, 47 y.o.   MRN: [id number]  Chief Complaint  Patient presents with   Pre-op Exam      ICD-10-CM   1. S/P breast reconstruction  Z98.890        History of Present Illness: Betty [name] is a 47 y.o.  female  with a history of right-sided breast cancer s/p bilateral breast reconstruction.  She presents for preoperative evaluation for upcoming procedure, excision of left breast mass and bilateral implant removal and replacement, scheduled for 02/11/2023 with Dr. Dillingham.  The patient has not had problems with anesthesia.  Previous surgeries without issue.  She denies any personal or family history of blood clots or clotting disorder.  No personal history of severe cardiac or pulmonary disease, use of anticoagulation, or varicosities.  She had been placed on Wegovy for being overweight and prediabetic, but states that she has already lost weight and is now BMI within normal limits.  She understands to hold her injectable at least 1 week prior to surgery.  She also will hold any NSAIDs and any vitamins/supplements 1 week prior to surgery.  She tells me that she would prefer to remain the same size as she is now.  Summary of Previous Visit: She was seen most recently by Dr. Dillingham on 12/15/2022.  She had 590 cc implants placed 02/2013.  She was seen by Dr. Blackman recently for possible extracapsular silicone mass in the lower portion of the left breast along inframammary ridge.  Discussed removal of mass in conjunction with implant exchange.  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  PMH Significant for: Right-sided breast cancer and BRCA1 gene, bilateral implant-based breast reconstruction 2014, allergic rhinitis.   Past Medical History: Allergies: Allergies  Allergen Reactions   Dextromethorphan Rash    Current Medications:  Current Outpatient Medications:    cephALEXin (KEFLEX) 500 MG capsule, Take 1  capsule (500 mg total) by mouth 4 (four) times daily for 3 days., Disp: 12 capsule, Rfl: 0   fluticasone (FLONASE) 50 MCG/ACT nasal spray, Place 1 spray into both nostrils daily., Disp: , Rfl:    ibuprofen (ADVIL) 200 MG tablet, Take 3 tablets (600 mg total) by mouth every 6 (six) hours as needed for moderate pain., Disp: 30 tablet, Rfl: 1   ondansetron (ZOFRAN-ODT) 4 MG disintegrating tablet, Take 1 tablet (4 mg total) by mouth every 8 (eight) hours as needed for nausea or vomiting., Disp: 20 tablet, Rfl: 0   oxyCODONE (ROXICODONE) 5 MG immediate release tablet, Take 1 tablet (5 mg total) by mouth every 8 (eight) hours as needed for up to 7 days for severe pain., Disp: 20 tablet, Rfl: 0   venlafaxine (EFFEXOR) 75 MG tablet, , Disp: , Rfl:    WEGOVY 2.4 MG/0.75ML SOAJ, Inject into the skin once a week., Disp: , Rfl:   Past Medical Problems: Past Medical History:  Diagnosis Date   Allergic rhinitis    Allergy    Breast cancer, BRCA1 positive RIGHT INVASIVE DUCTAL CANCER---  S/P BILATERAL MASTECTOMY W/ RECONSTRUCTION  08-05-2012--  CHEMO ENEDED 06-22-2012   ONCOLOGIST- DR MAGRINAT; chemo   GERD (gastroesophageal reflux disease)    HEARTBURN OCCASIONALLY    Heart murmur    TOLD THIS AS A CHILD, NO PROBLEMS OTHERWISE   Preeclampsia    DURING PREGNANCY ; REPORTS AS RESOLVED     Past Surgical History: Past Surgical History:  Procedure Laterality Date     BREAST RECONSTRUCTION  08/05/2012   Procedure: BREAST RECONSTRUCTION;  Surgeon: Wayland Denis, DO;  Location[facility];  Service: Plastics;  Laterality: Bilateral;   INGUINAL HERNIA REPAIR  AS INFANT   LAPAROSCOPIC SALPINGO OOPHERECTOMY Bilateral 02/15/2018   Procedure: LAPAROSCOPIC SALPINGO OOPHORECTOMY;  Surgeon: Richarda Overlie, MD;  Location: [facility];  Service: Gynecology;  Laterality: Bilateral;   MASTECTOMY W/ SENTINEL NODE BIOPSY  08/05/2012   Procedure: MASTECTOMY WITH SENTINEL LYMPH NODE BIOPSY;  Surgeon: Shelly Rubenstein, MD;  Location[facility];  Service: General;  Laterality: Right;   AND LEFT MASTECTOMY   PORT-A-CATH REMOVAL  08/05/2012   Procedure: REMOVAL PORT-A-CATH;  Surgeon: Shelly Rubenstein, MD;  Location[facility];  Service: General;  Laterality: N/A;   PORTACATH PLACEMENT  03/10/2012   Procedure: INSERTION PORT-A-CATH;  Surgeon: Shelly Rubenstein, MD;  [facility];  Service: General;  Laterality: N/A;   SHOULDER SURGERY  1996  &  1999   - labrum and capsular repair   TISSUE EXPANDER PLACEMENT  08/05/2012   Procedure: TISSUE EXPANDER;  Surgeon: Wayland Denis, DO;  Location: [facility];  Service: Plastics;  Laterality: Bilateral;  With Expanders and Flex HD Placement    TRANSTHORACIC ECHOCARDIOGRAM  03-08-2012   NORMAL LVSF/ EF 60-65%   VAGINAL BIRTH AFTER CESAREAN SECTION  2018   PREECLAMPSIA     Social History: Social History   Socioeconomic History   Marital status: Married    Spouse name: Not on file   Number of children: Not on file   Years of education: Not on file   Highest education level: Not on file  Occupational History   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  Tobacco Use   Smoking status: Never   Smokeless tobacco: Never  Vaping Use   Vaping Use: Never used  Substance and Sexual Activity   Alcohol use: Yes    Comment: occasionaly    Drug use: No   Sexual activity: Yes    Birth control/protection: Condom  Other Topics Concern   Not on file  Social History Narrative   Not on file   Social Determinants of Health   Financial Resource Strain: Not on file  Food Insecurity: Not on file  Transportation Needs: Not on file  Physical Activity: Not on file  Stress: Not on file  Social Connections: Not on file  Intimate Partner Violence: Not on file    Family History: Family History  Problem Relation Age of Onset   Ovarian cancer Mother 73   Cancer Mother        ovarian   Colon polyps Father    Cancer Paternal Uncle        leukemia    Cancer Paternal Uncle        lung cancer   Allergic rhinitis Other        grandmother   Colon cancer Neg Hx    Esophageal cancer Neg Hx    Stomach cancer Neg Hx    Rectal cancer Neg Hx     Review of Systems: ROS Denies any recent chest pain, difficulty breathing, leg swelling, fevers.  Physical Exam: Vital Signs BP 131/80 (BP Location: Left Arm, Patient Position: Sitting, Cuff Size: Normal)   Pulse 66   Ht 5\' 10"  (1.778 m)   Wt 162 lb 7.7 oz (73.7 kg)   LMP 01/25/2018 (Approximate)   SpO2 98%   BMI 23.31 kg/m   Physical Exam Constitutional:  General: Not in acute distress.    Appearance: Normal appearance. Not ill-appearing.  HENT:     Head: Normocephalic and atraumatic.  Eyes:     Pupils: Pupils are equal, round. Cardiovascular:     Rate and Rhythm: Normal rate.    Pulses: Normal pulses.  Pulmonary:     Effort: No respiratory distress or increased work of breathing.  Speaks in full sentences. Abdominal:     General: Abdomen is flat. No distension.   Musculoskeletal: Normal range of motion. No lower extremity swelling or edema. No varicosities. Skin:    General: Skin is warm and dry.     Findings: No erythema or rash.  Neurological:     Mental Status: Alert and oriented to person, place, and time.  Psychiatric:        Mood and Affect: Mood normal.        Behavior: Behavior normal.    Assessment/Plan: The patient is scheduled for implant exchange of bilateral breasts with excision of left breast mass with Dr. Ulice Boldillingham.  Risks, benefits, and alternatives of procedure discussed, questions answered and consent obtained.    Smoking Status: Non-smoker. Last Mammogram: 12/05/2022 unilateral left; Results: BI-RADS Category 2: Benign.  Silicone granuloma left breast.  Caprini Score: 5; Risk Factors include: Age, personal breast cancer, and length of planned surgery. Recommendation for mechanical prophylaxis. Encourage early ambulation.   Pictures obtained:  12/15/2022  Post-op Rx sent to pharmacy: Oxycodone, Zofran, Keflex.  Patient was provided with the General Surgical Risk consent document and Pain Medication Agreement prior to their appointment.  They had adequate time to read through the risk consent documents and Pain Medication Agreement. We also discussed them in person together during this preop appointment. All of their questions were answered to their satisfaction.  Recommended calling if they have any further questions.  Risk consent form and Pain Medication Agreement to be scanned into patient's chart.  The risks that can be encountered with and after placement of a breast implant placement were discussed and include the following but not limited to these: bleeding, infection, delayed healing, anesthesia risks, skin sensation changes, injury to structures including nerves, blood vessels, and muscles which may be temporary or permanent, allergies to tape, suture materials and glues, blood products, topical preparations or injected agents, skin contour irregularities, skin discoloration and swelling, deep vein thrombosis, cardiac and pulmonary complications, pain, which may persist, fluid accumulation, wrinkling of the skin over the implant, changes in nipple or breast sensation, implant leakage or rupture, faulty position of the implant, persistent pain, formation of tight scar tissue around the implant (capsular contracture), possible need for revisional surgery or staged procedures.    Electronically signed by: Evelena LeydenGarrett Blanch Stang, PA-C 02/06/2023 9:46 AM

## 2023-02-06 NOTE — H&P (View-Only) (Signed)
Patient ID: Christain Sacramento, female    DOB: Mar 15, 1976, 47 y.o.   MRN: [id number]  Chief Complaint  Patient presents with   Pre-op Exam      ICD-10-CM   1. S/P breast reconstruction  Z98.890        History of Present Illness: JORDANN GRIME is a 47 y.o.  female  with a history of right-sided breast cancer s/p bilateral breast reconstruction.  She presents for preoperative evaluation for upcoming procedure, excision of left breast mass and bilateral implant removal and replacement, scheduled for 02/11/2023 with Dr. Ulice Bold.  The patient has not had problems with anesthesia.  Previous surgeries without issue.  She denies any personal or family history of blood clots or clotting disorder.  No personal history of severe cardiac or pulmonary disease, use of anticoagulation, or varicosities.  She had been placed on Wegovy for being overweight and prediabetic, but states that she has already lost weight and is now BMI within normal limits.  She understands to hold her injectable at least 1 week prior to surgery.  She also will hold any NSAIDs and any vitamins/supplements 1 week prior to surgery.  She tells me that she would prefer to remain the same size as she is now.  Summary of Previous Visit: She was seen most recently by Dr. Ulice Bold on 12/15/2022.  She had 590 cc implants placed 02/2013.  She was seen by Dr. Magnus Ivan recently for possible extracapsular silicone mass in the lower portion of the left breast along inframammary ridge.  Discussed removal of mass in conjunction with implant exchange.  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  PMH Significant for: Right-sided breast cancer and BRCA1 gene, bilateral implant-based breast reconstruction 2014, allergic rhinitis.   Past Medical History: Allergies: Allergies  Allergen Reactions   Dextromethorphan Rash    Current Medications:  Current Outpatient Medications:    cephALEXin (KEFLEX) 500 MG capsule, Take 1  capsule (500 mg total) by mouth 4 (four) times daily for 3 days., Disp: 12 capsule, Rfl: 0   fluticasone (FLONASE) 50 MCG/ACT nasal spray, Place 1 spray into both nostrils daily., Disp: , Rfl:    ibuprofen (ADVIL) 200 MG tablet, Take 3 tablets (600 mg total) by mouth every 6 (six) hours as needed for moderate pain., Disp: 30 tablet, Rfl: 1   ondansetron (ZOFRAN-ODT) 4 MG disintegrating tablet, Take 1 tablet (4 mg total) by mouth every 8 (eight) hours as needed for nausea or vomiting., Disp: 20 tablet, Rfl: 0   oxyCODONE (ROXICODONE) 5 MG immediate release tablet, Take 1 tablet (5 mg total) by mouth every 8 (eight) hours as needed for up to 7 days for severe pain., Disp: 20 tablet, Rfl: 0   venlafaxine (EFFEXOR) 75 MG tablet, , Disp: , Rfl:    WEGOVY 2.4 MG/0.75ML SOAJ, Inject into the skin once a week., Disp: , Rfl:   Past Medical Problems: Past Medical History:  Diagnosis Date   Allergic rhinitis    Allergy    Breast cancer, BRCA1 positive RIGHT INVASIVE DUCTAL CANCER---  S/P BILATERAL MASTECTOMY W/ RECONSTRUCTION  08-05-2012--  CHEMO ENEDED 06-22-2012   ONCOLOGIST- DR Darnelle Catalan; chemo   GERD (gastroesophageal reflux disease)    HEARTBURN OCCASIONALLY    Heart murmur    TOLD THIS AS A CHILD, NO PROBLEMS OTHERWISE   Preeclampsia    DURING PREGNANCY ; REPORTS AS RESOLVED     Past Surgical History: Past Surgical History:  Procedure Laterality Date  BREAST RECONSTRUCTION  08/05/2012   Procedure: BREAST RECONSTRUCTION;  Surgeon: Wayland Denis, DO;  Location: [facility];  Service: Plastics;  Laterality: Bilateral;   INGUINAL HERNIA REPAIR  AS INFANT   LAPAROSCOPIC SALPINGO OOPHERECTOMY Bilateral 02/15/2018   Procedure: LAPAROSCOPIC SALPINGO OOPHORECTOMY;  Surgeon: Richarda Overlie, MD;  Location: [facility];  Service: Gynecology;  Laterality: Bilateral;   MASTECTOMY W/ SENTINEL NODE BIOPSY  08/05/2012   Procedure: MASTECTOMY WITH SENTINEL LYMPH NODE BIOPSY;  Surgeon: Shelly Rubenstein, MD;  Location: [facility];  Service: General;  Laterality: Right;   AND LEFT MASTECTOMY   PORT-A-CATH REMOVAL  08/05/2012   Procedure: REMOVAL PORT-A-CATH;  Surgeon: Shelly Rubenstein, MD;  Location: [facility];  Service: General;  Laterality: N/A;   PORTACATH PLACEMENT  03/10/2012   Procedure: INSERTION PORT-A-CATH;  Surgeon: Shelly Rubenstein, MD;  Location: [facility];  Service: General;  Laterality: N/A;   SHOULDER SURGERY  1996  &  1999   - labrum and capsular repair   TISSUE EXPANDER PLACEMENT  08/05/2012   Procedure: TISSUE EXPANDER;  Surgeon: Wayland Denis, DO;  Location: [facility];  Service: Plastics;  Laterality: Bilateral;  With Expanders and Flex HD Placement    TRANSTHORACIC ECHOCARDIOGRAM  03-08-2012   NORMAL LVSF/ EF 60-65%   VAGINAL BIRTH AFTER CESAREAN SECTION  2018   PREECLAMPSIA     Social History: Social History   Socioeconomic History   Marital status: Married    Spouse name: Not on file   Number of children: Not on file   Years of education: Not on file   Highest education level: Not on file  Occupational History   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  Tobacco Use   Smoking status: Never   Smokeless tobacco: Never  Vaping Use   Vaping Use: Never used  Substance and Sexual Activity   Alcohol use: Yes    Comment: occasionaly    Drug use: No   Sexual activity: Yes    Birth control/protection: Condom  Other Topics Concern   Not on file  Social History Narrative   Not on file   Social Determinants of Health   Financial Resource Strain: Not on file  Food Insecurity: Not on file  Transportation Needs: Not on file  Physical Activity: Not on file  Stress: Not on file  Social Connections: Not on file  Intimate Partner Violence: Not on file    Family History: Family History  Problem Relation Age of Onset   Ovarian cancer Mother 73   Cancer Mother        ovarian   Colon polyps Father    Cancer Paternal Uncle        leukemia    Cancer Paternal Uncle        lung cancer   Allergic rhinitis Other        grandmother   Colon cancer Neg Hx    Esophageal cancer Neg Hx    Stomach cancer Neg Hx    Rectal cancer Neg Hx     Review of Systems: ROS Denies any recent chest pain, difficulty breathing, leg swelling, fevers.  Physical Exam: Vital Signs BP 131/80 (BP Location: Left Arm, Patient Position: Sitting, Cuff Size: Normal)   Pulse 66   Ht 5\' 10"  (1.778 m)   Wt 162 lb 7.7 oz (73.7 kg)   LMP 01/25/2018 (Approximate)   SpO2 98%   BMI 23.31 kg/m   Physical Exam Constitutional:  General: Not in acute distress.    Appearance: Normal appearance. Not ill-appearing.  HENT:     Head: Normocephalic and atraumatic.  Eyes:     Pupils: Pupils are equal, round. Cardiovascular:     Rate and Rhythm: Normal rate.    Pulses: Normal pulses.  Pulmonary:     Effort: No respiratory distress or increased work of breathing.  Speaks in full sentences. Abdominal:     General: Abdomen is flat. No distension.   Musculoskeletal: Normal range of motion. No lower extremity swelling or edema. No varicosities. Skin:    General: Skin is warm and dry.     Findings: No erythema or rash.  Neurological:     Mental Status: Alert and oriented to person, place, and time.  Psychiatric:        Mood and Affect: Mood normal.        Behavior: Behavior normal.    Assessment/Plan: The patient is scheduled for implant exchange of bilateral breasts with excision of left breast mass with Dr. Ulice Boldillingham.  Risks, benefits, and alternatives of procedure discussed, questions answered and consent obtained.    Smoking Status: Non-smoker. Last Mammogram: 12/05/2022 unilateral left; Results: BI-RADS Category 2: Benign.  Silicone granuloma left breast.  Caprini Score: 5; Risk Factors include: Age, personal breast cancer, and length of planned surgery. Recommendation for mechanical prophylaxis. Encourage early ambulation.   Pictures obtained:  12/15/2022  Post-op Rx sent to pharmacy: Oxycodone, Zofran, Keflex.  Patient was provided with the General Surgical Risk consent document and Pain Medication Agreement prior to their appointment.  They had adequate time to read through the risk consent documents and Pain Medication Agreement. We also discussed them in person together during this preop appointment. All of their questions were answered to their satisfaction.  Recommended calling if they have any further questions.  Risk consent form and Pain Medication Agreement to be scanned into patient's chart.  The risks that can be encountered with and after placement of a breast implant placement were discussed and include the following but not limited to these: bleeding, infection, delayed healing, anesthesia risks, skin sensation changes, injury to structures including nerves, blood vessels, and muscles which may be temporary or permanent, allergies to tape, suture materials and glues, blood products, topical preparations or injected agents, skin contour irregularities, skin discoloration and swelling, deep vein thrombosis, cardiac and pulmonary complications, pain, which may persist, fluid accumulation, wrinkling of the skin over the implant, changes in nipple or breast sensation, implant leakage or rupture, faulty position of the implant, persistent pain, formation of tight scar tissue around the implant (capsular contracture), possible need for revisional surgery or staged procedures.    Electronically signed by: Evelena LeydenGarrett Ramya Vanbergen, PA-C 02/06/2023 9:46 AM

## 2023-02-11 ENCOUNTER — Ambulatory Visit (HOSPITAL_BASED_OUTPATIENT_CLINIC_OR_DEPARTMENT_OTHER): Payer: BC Managed Care – PPO | Admitting: Anesthesiology

## 2023-02-11 ENCOUNTER — Encounter (HOSPITAL_BASED_OUTPATIENT_CLINIC_OR_DEPARTMENT_OTHER): Payer: Self-pay | Admitting: Plastic Surgery

## 2023-02-11 ENCOUNTER — Encounter (HOSPITAL_BASED_OUTPATIENT_CLINIC_OR_DEPARTMENT_OTHER)
Admission: RE | Disposition: A | Discharge status: Home / Self Care | Payer: Self-pay | Source: Home / Self Care | Attending: Plastic Surgery

## 2023-02-11 ENCOUNTER — Other Ambulatory Visit: Payer: Self-pay

## 2023-02-11 ENCOUNTER — Ambulatory Visit (HOSPITAL_BASED_OUTPATIENT_CLINIC_OR_DEPARTMENT_OTHER)
Admission: RE | Admit: 2023-02-11 | Discharge: 2023-02-11 | Disposition: A | Discharge status: Home / Self Care | Payer: BC Managed Care – PPO | Attending: Plastic Surgery | Admitting: Plastic Surgery

## 2023-02-11 ENCOUNTER — Telehealth: Payer: Self-pay | Admitting: Plastic Surgery

## 2023-02-11 DIAGNOSIS — Z1501 Genetic susceptibility to malignant neoplasm of breast: Secondary | ICD-10-CM | POA: Insufficient documentation

## 2023-02-11 DIAGNOSIS — Z853 Personal history of malignant neoplasm of breast: Secondary | ICD-10-CM

## 2023-02-11 DIAGNOSIS — Y831 Surgical operation with implant of artificial internal device as the cause of abnormal reaction of the patient, or of later complication, without mention of misadventure at the time of the procedure: Secondary | ICD-10-CM | POA: Diagnosis not present

## 2023-02-11 DIAGNOSIS — Z8041 Family history of malignant neoplasm of ovary: Secondary | ICD-10-CM | POA: Insufficient documentation

## 2023-02-11 DIAGNOSIS — Z9013 Acquired absence of bilateral breasts and nipples: Secondary | ICD-10-CM

## 2023-02-11 DIAGNOSIS — I10 Essential (primary) hypertension: Secondary | ICD-10-CM | POA: Diagnosis not present

## 2023-02-11 DIAGNOSIS — Z806 Family history of leukemia: Secondary | ICD-10-CM | POA: Diagnosis not present

## 2023-02-11 DIAGNOSIS — T8549XA Other mechanical complication of breast prosthesis and implant, initial encounter: Secondary | ICD-10-CM | POA: Insufficient documentation

## 2023-02-11 DIAGNOSIS — Z801 Family history of malignant neoplasm of trachea, bronchus and lung: Secondary | ICD-10-CM | POA: Diagnosis not present

## 2023-02-11 HISTORY — PX: BREAST CYST EXCISION: SHX579

## 2023-02-11 HISTORY — PX: REMOVAL OF BILATERAL TISSUE EXPANDERS WITH PLACEMENT OF BILATERAL BREAST IMPLANTS: SHX6431

## 2023-02-11 SURGERY — REMOVAL, TISSUE EXPANDER, BREAST, BILATERAL, WITH BILATERAL IMPLANT IMPLANT INSERTION
Anesthesia: General | Site: Breast | Laterality: Left

## 2023-02-11 MED ORDER — FENTANYL CITRATE (PF) 100 MCG/2ML IJ SOLN
INTRAMUSCULAR | Status: AC
  Filled 2023-02-11: qty 2

## 2023-02-11 MED ORDER — PROPOFOL 10 MG/ML IV BOLUS
INTRAVENOUS | Status: AC
  Filled 2023-02-11: qty 20

## 2023-02-11 MED ORDER — PROPOFOL 10 MG/ML IV BOLUS
INTRAVENOUS | Status: DC | PRN
  Administered 2023-02-11: 20 mg via INTRAVENOUS
  Administered 2023-02-11: 150 mg via INTRAVENOUS

## 2023-02-11 MED ORDER — DEXAMETHASONE SODIUM PHOSPHATE 10 MG/ML IJ SOLN
INTRAMUSCULAR | Status: DC | PRN
  Administered 2023-02-11: 10 mg via INTRAVENOUS

## 2023-02-11 MED ORDER — EPHEDRINE 5 MG/ML INJ
INTRAVENOUS | Status: AC
  Filled 2023-02-11: qty 5

## 2023-02-11 MED ORDER — SODIUM CHLORIDE 0.9 % IV SOLN
INTRAVENOUS | Status: AC
  Filled 2023-02-11: qty 10

## 2023-02-11 MED ORDER — FENTANYL CITRATE (PF) 100 MCG/2ML IJ SOLN: 25.0000 ug | INTRAMUSCULAR | Status: DC | PRN

## 2023-02-11 MED ORDER — CEFAZOLIN SODIUM-DEXTROSE 2-4 GM/100ML-% IV SOLN
INTRAVENOUS | Status: AC
  Filled 2023-02-11: qty 100

## 2023-02-11 MED ORDER — PHENYLEPHRINE HCL (PRESSORS) 10 MG/ML IV SOLN
INTRAVENOUS | Status: DC | PRN
  Administered 2023-02-11: 80 ug via INTRAVENOUS

## 2023-02-11 MED ORDER — ONDANSETRON HCL 4 MG/2ML IJ SOLN
INTRAMUSCULAR | Status: DC | PRN
  Administered 2023-02-11: 4 mg via INTRAVENOUS

## 2023-02-11 MED ORDER — MIDAZOLAM HCL 5 MG/5ML IJ SOLN
INTRAMUSCULAR | Status: DC | PRN
  Administered 2023-02-11: 2 mg via INTRAVENOUS

## 2023-02-11 MED ORDER — ACETAMINOPHEN 10 MG/ML IV SOLN: 1000.0000 mg | Freq: Once | INTRAVENOUS | Status: DC | PRN

## 2023-02-11 MED ORDER — OXYCODONE HCL 5 MG PO TABS: 5.0000 mg | ORAL_TABLET | ORAL | Status: DC | PRN

## 2023-02-11 MED ORDER — SODIUM CHLORIDE 0.9% FLUSH: 3.0000 mL | INTRAVENOUS | Status: DC | PRN

## 2023-02-11 MED ORDER — CHLORHEXIDINE GLUCONATE CLOTH 2 % EX PADS: 6.0000 | MEDICATED_PAD | Freq: Once | CUTANEOUS | Status: DC

## 2023-02-11 MED ORDER — ACETAMINOPHEN 325 MG RE SUPP: 650.0000 mg | RECTAL | Status: DC | PRN

## 2023-02-11 MED ORDER — ONDANSETRON HCL 4 MG/2ML IJ SOLN
INTRAMUSCULAR | Status: AC
  Filled 2023-02-11: qty 2

## 2023-02-11 MED ORDER — LACTATED RINGERS IV SOLN: INTRAVENOUS | Status: DC

## 2023-02-11 MED ORDER — MIDAZOLAM HCL 2 MG/2ML IJ SOLN
INTRAMUSCULAR | Status: AC
  Filled 2023-02-11: qty 2

## 2023-02-11 MED ORDER — LIDOCAINE HCL (CARDIAC) PF 100 MG/5ML IV SOSY
PREFILLED_SYRINGE | INTRAVENOUS | Status: DC | PRN
  Administered 2023-02-11: 60 mg via INTRATRACHEAL

## 2023-02-11 MED ORDER — EPHEDRINE SULFATE (PRESSORS) 50 MG/ML IJ SOLN
INTRAMUSCULAR | Status: DC | PRN
  Administered 2023-02-11: 7.5 mg via INTRAVENOUS
  Administered 2023-02-11 (×2): 5 mg via INTRAVENOUS

## 2023-02-11 MED ORDER — PHENYLEPHRINE 80 MCG/ML (10ML) SYRINGE FOR IV PUSH (FOR BLOOD PRESSURE SUPPORT)
PREFILLED_SYRINGE | INTRAVENOUS | Status: AC
  Filled 2023-02-11: qty 10

## 2023-02-11 MED ORDER — DEXAMETHASONE SODIUM PHOSPHATE 10 MG/ML IJ SOLN
INTRAMUSCULAR | Status: AC
  Filled 2023-02-11: qty 1

## 2023-02-11 MED ORDER — FENTANYL CITRATE (PF) 100 MCG/2ML IJ SOLN
25.0000 ug | INTRAMUSCULAR | Status: DC | PRN
  Administered 2023-02-11: 50 ug via INTRAVENOUS

## 2023-02-11 MED ORDER — ACETAMINOPHEN 325 MG PO TABS: 650.0000 mg | ORAL_TABLET | ORAL | Status: DC | PRN

## 2023-02-11 MED ORDER — CEFAZOLIN SODIUM-DEXTROSE 2-4 GM/100ML-% IV SOLN
2.0000 g | INTRAVENOUS | Status: AC
  Administered 2023-02-11: 2 g via INTRAVENOUS

## 2023-02-11 MED ORDER — LIDOCAINE-EPINEPHRINE 1 %-1:100000 IJ SOLN
INTRAMUSCULAR | Status: DC | PRN
  Administered 2023-02-11: 20 mL via INTRAMUSCULAR

## 2023-02-11 MED ORDER — SODIUM CHLORIDE 0.9 % IV SOLN: 250.0000 mL | INTRAVENOUS | Status: DC | PRN

## 2023-02-11 MED ORDER — FENTANYL CITRATE (PF) 100 MCG/2ML IJ SOLN
INTRAMUSCULAR | Status: DC | PRN
  Administered 2023-02-11 (×2): 25 ug via INTRAVENOUS
  Administered 2023-02-11: 50 ug via INTRAVENOUS

## 2023-02-11 MED ORDER — SODIUM CHLORIDE 0.9% FLUSH: 3.0000 mL | Freq: Two times a day (BID) | INTRAVENOUS | Status: DC

## 2023-02-11 MED ORDER — SODIUM CHLORIDE 0.9 % IV SOLN
INTRAVENOUS | Status: DC | PRN
  Administered 2023-02-11 (×2): 500 mL

## 2023-02-11 SURGICAL SUPPLY — 64 items
ADH SKN CLS APL DERMABOND .7 (GAUZE/BANDAGES/DRESSINGS) ×4
BAG DECANTER FOR FLEXI CONT (MISCELLANEOUS) ×3 IMPLANT
BINDER BREAST LRG (GAUZE/BANDAGES/DRESSINGS) IMPLANT
BINDER BREAST MEDIUM (GAUZE/BANDAGES/DRESSINGS) IMPLANT
BINDER BREAST XLRG (GAUZE/BANDAGES/DRESSINGS) IMPLANT
BINDER BREAST XXLRG (GAUZE/BANDAGES/DRESSINGS) IMPLANT
BIOPATCH RED 1 DISK 7.0 (GAUZE/BANDAGES/DRESSINGS) IMPLANT
BLADE HEX COATED 2.75 (ELECTRODE) IMPLANT
BLADE SURG 15 STRL LF DISP TIS (BLADE) ×6 IMPLANT
BLADE SURG 15 STRL SS (BLADE) ×4
CANISTER SUCT 1200ML W/VALVE (MISCELLANEOUS) ×3 IMPLANT
COVER BACK TABLE 60X90IN (DRAPES) ×3 IMPLANT
COVER MAYO STAND STRL (DRAPES) ×3 IMPLANT
DERMABOND ADVANCED .7 DNX12 (GAUZE/BANDAGES/DRESSINGS) ×6 IMPLANT
DRAIN CHANNEL 19F RND (DRAIN) IMPLANT
DRAPE LAPAROSCOPIC ABDOMINAL (DRAPES) ×3 IMPLANT
DRSG OPSITE POSTOP 4X6 (GAUZE/BANDAGES/DRESSINGS) ×6 IMPLANT
ELECT BLADE 4.0 EZ CLEAN MEGAD (MISCELLANEOUS) ×2
ELECT REM PT RETURN 9FT ADLT (ELECTROSURGICAL) ×2
ELECTRODE BLDE 4.0 EZ CLN MEGD (MISCELLANEOUS) ×3 IMPLANT
ELECTRODE REM PT RTRN 9FT ADLT (ELECTROSURGICAL) ×3 IMPLANT
EVACUATOR SILICONE 100CC (DRAIN) IMPLANT
FUNNEL KELLER 2 DISP (MISCELLANEOUS) IMPLANT
GAUZE PAD ABD 8X10 STRL (GAUZE/BANDAGES/DRESSINGS) ×6 IMPLANT
GAUZE SPONGE 4X4 12PLY STRL LF (GAUZE/BANDAGES/DRESSINGS) IMPLANT
GLOVE BIO SURGEON STRL SZ 6.5 (GLOVE) ×9 IMPLANT
GLOVE BIO SURGEON STRL SZ7 (GLOVE) IMPLANT
GLOVE BIOGEL PI IND STRL 7.0 (GLOVE) IMPLANT
GOWN STRL REUS W/ TWL LRG LVL3 (GOWN DISPOSABLE) ×6 IMPLANT
GOWN STRL REUS W/TWL LRG LVL3 (GOWN DISPOSABLE) ×4
IMPL GEL HP 550CC (Breast) IMPLANT
IMPL GEL HP 590CC (Breast) IMPLANT
IMPLANT GEL HP 550CC (Breast) ×2 IMPLANT
IMPLANT GEL HP 590CC (Breast) ×2 IMPLANT
IV NS 1000ML (IV SOLUTION)
IV NS 1000ML BAXH (IV SOLUTION) IMPLANT
KIT FILL ASEPTIC TRANSFER (MISCELLANEOUS) IMPLANT
NDL HYPO 25X1 1.5 SAFETY (NEEDLE) ×3 IMPLANT
NDL SAFETY ECLIP 18X1.5 (MISCELLANEOUS) ×3 IMPLANT
NEEDLE HYPO 25X1 1.5 SAFETY (NEEDLE) ×2 IMPLANT
PACK BASIN DAY SURGERY FS (CUSTOM PROCEDURE TRAY) ×3 IMPLANT
PENCIL SMOKE EVACUATOR (MISCELLANEOUS) ×3 IMPLANT
PIN SAFETY STERILE (MISCELLANEOUS) IMPLANT
SIZER BREAST REUSE 535CC (SIZER) ×2
SIZER BREAST REUSE 550CC (SIZER) ×2
SIZER BRST REUSE 550CC (SIZER) IMPLANT
SIZER BRST REUSE ULT HI 535CC (SIZER) IMPLANT
SLEEVE SCD COMPRESS KNEE MED (STOCKING) ×3 IMPLANT
SPIKE FLUID TRANSFER (MISCELLANEOUS) IMPLANT
SPONGE T-LAP 18X18 ~~LOC~~+RFID (SPONGE) ×6 IMPLANT
STRIP SUTURE WOUND CLOSURE 1/2 (MISCELLANEOUS) ×6 IMPLANT
SUT MNCRL AB 4-0 PS2 18 (SUTURE) ×6 IMPLANT
SUT MON AB 3-0 SH 27 (SUTURE) ×4
SUT MON AB 3-0 SH27 (SUTURE) ×6 IMPLANT
SUT PDS 3-0 CT2 (SUTURE) ×4
SUT PDS AB 2-0 CT2 27 (SUTURE) ×6 IMPLANT
SUT PDS II 3-0 CT2 27 ABS (SUTURE) ×6 IMPLANT
SYR BULB IRRIG 60ML STRL (SYRINGE) ×3 IMPLANT
SYR CONTROL 10ML LL (SYRINGE) ×3 IMPLANT
TOWEL GREEN STERILE FF (TOWEL DISPOSABLE) ×6 IMPLANT
TRAY DSU PREP LF (CUSTOM PROCEDURE TRAY) ×3 IMPLANT
TUBE CONNECTING 20X1/4 (TUBING) ×3 IMPLANT
UNDERPAD 30X36 HEAVY ABSORB (UNDERPADS AND DIAPERS) ×6 IMPLANT
YANKAUER SUCT BULB TIP NO VENT (SUCTIONS) ×3 IMPLANT

## 2023-02-11 NOTE — Anesthesia Procedure Notes (Signed)
Procedure Name: LMA Insertion Date/Time: 02/11/2023 12:22 PM  Performed by: Thornell Mule, CRNAPre-anesthesia Checklist: Patient identified, Emergency Drugs available, Suction available and Patient being monitored Patient Re-evaluated:Patient Re-evaluated prior to induction Oxygen Delivery Method: Circle system utilized Preoxygenation: Pre-oxygenation with 100% oxygen Induction Type: IV induction LMA: LMA inserted LMA Size: 4.0 Number of attempts: 1 Placement Confirmation: positive ETCO2 Tube secured with: Tape Dental Injury: Teeth and Oropharynx as per pre-operative assessment

## 2023-02-11 NOTE — Interval H&P Note (Signed)
History and Physical Interval Note:  02/11/2023 11:33 AM  Betty [name]  has presented today for surgery, with the diagnosis of BRCA1 positive.  The various methods of treatment have been discussed with the patient and family. After consideration of risks, benefits and other options for treatment, the patient has consented to  Procedure(s): Bilateral removal of breast implants and replacement and excision of left breast mass (Bilateral) excision of left breast mass (Left) as a surgical intervention.  The patient's history has been reviewed, patient examined, no change in status, stable for surgery.  I have reviewed the patient's chart and labs.  Questions were answered to the patient's satisfaction.     Alena Bills Herberto Ledwell

## 2023-02-11 NOTE — Transfer of Care (Signed)
Immediate Anesthesia Transfer of Care Note  Patient: Betty [name]  Procedure(s) Performed: Bilateral removal of breast implants and replacement and excision of left breast mass (Bilateral: Breast) excision of left breast mass (Left: Breast)  Patient Location: PACU  Anesthesia Type:General  Level of Consciousness: drowsy, patient cooperative, and responds to stimulation  Airway & Oxygen Therapy: Patient Spontanous Breathing and Patient connected to face mask oxygen  Post-op Assessment: Report given to RN and Post -op Vital signs reviewed and stable  Post vital signs: Reviewed and stable  Last Vitals:  Vitals Value Taken Time  BP    Temp    Pulse 80 02/11/23 1345  Resp 16 02/11/23 1345  SpO2 100 % 02/11/23 1345  Vitals shown include unvalidated device data.  Last Pain:  Vitals:   02/11/23 1034  TempSrc: Oral  PainSc: 0-No pain      Patients Stated Pain Goal: 3 (02/11/23 1034)  Complications: No notable events documented.

## 2023-02-11 NOTE — Anesthesia Postprocedure Evaluation (Signed)
Anesthesia Post Note  Patient: Betty [name]  Procedure(s) Performed: Bilateral removal of breast implants and replacement and excision of left breast mass (Bilateral: Breast) excision of left breast mass (Left: Breast)     Patient location during evaluation: PACU Anesthesia Type: General Level of consciousness: sedated and patient cooperative Pain management: pain level controlled Vital Signs Assessment: post-procedure vital signs reviewed and stable Respiratory status: spontaneous breathing Cardiovascular status: stable Anesthetic complications: no   No notable events documented.  Last Vitals:  Vitals:   02/11/23 1415 02/11/23 1450  BP: 126/76 123/75  Pulse: 70 69  Resp: 10 12  Temp:  36.5 C  SpO2: 94% 95%    Last Pain:  Vitals:   02/11/23 1450  TempSrc: Oral  PainSc:                  Betty [name]

## 2023-02-11 NOTE — Telephone Encounter (Signed)
Pt mom called to return Dr Gus Height call concerning her daughters sx.

## 2023-02-11 NOTE — Op Note (Signed)
Op report   DATE OF OPERATION: 02/11/2023  LOCATION: [facility]  SURGICAL DIVISION: Plastic Surgery  PREOPERATIVE DIAGNOSIS:  History of breast cancer.  2. Acquired absence of bilateral breast.   POSTOPERATIVE DIAGNOSIS:  1. History of breast cancer.  2. Acquired absence of bilateral breast.   PROCEDURE:  1. Exchange of right breast implant. 2. Removal of left ruptured silicone implant 3. Exchange of left breast implant  4. Left capsulectomy for implant respositioning. 5. Excision of left breast mass 2 cm.  SURGEON: Foster Simpson, DO  ASSISTANT: Evelena Leyden, PA  ANESTHESIA:  General.   COMPLICATIONS: None.   IMPLANTS: Left - Mentor Smooth Round High Profile Gel 550 cc. Right - Mentor Smooth Round Ultra High Profile Gel 590 cc.   INDICATIONS FOR PROCEDURE:  The patient, Betty [name], is a 47 y.o. female born on [birth year], is here for treatment after bilateral mastectomies.  She had implant reconstruction over 10 years ago.  Over the last several years she noticed tenderness on the left breast.  She then had a mass at the inferior aspect.  It was suspected to be silicone from a ruptured implant.  She now presents for removal and replacement with capsulectomy and excision of the lesion.  MRN: [id number]  CONSENT:  Informed consent was obtained directly from the patient. Risks, benefits and alternatives were fully discussed. Specific risks including but not limited to bleeding, infection, hematoma, seroma, scarring, pain, implant infection, implant extrusion, capsular contracture, asymmetry, wound healing problems, and need for further surgery were all discussed. The patient did have an ample opportunity to have her questions answered to her satisfaction.   DESCRIPTION OF PROCEDURE:  The patient was taken to the operating room. SCDs were placed and IV antibiotics were given. The patient's chest was prepped and draped in a sterile fashion. A time  out was performed and the implants to be used were identified.    On the right breast: Local with epinephrine was used to infiltrate at the incision site. The old mastectomy scar was incised over the breast.  The mastectomy flaps from the superior and inferior flaps were raised over the pectoralis major muscle for 1 cm to minimize tension for the closure. The ADM was split inferior to the skin incision to expose and remove the implant. It was fractured but intact.  Inspection of the pocket showed a normal healthy capsule.  The pocket was irrigated with antibiotic solution. The 590 cc implant was selected.  Hemostasis was ensured with electrocautery. New gloves were placed. The implant was soaked in antibiotic solution and then placed in the pocket and oriented appropriately. The ADM and capsule on the anterior surface were re-closed with a 3-0 Monocryl suture. The remaining skin was closed with 3-0 PDS deep dermal and 4-0 Monocryl subcuticular stitches.   On the left breast: The lesion was located at the inframammary fold.  For this reason, the incision was made over this area.  The #15 blade was used to make the incision.  The 2 cm lesion was noted and excised.  It appeared to the a silicone block.  The mastectomy flaps were raised over the for 1 cm to minimize tension for the closure. The ADM was split to expose and remove the implant which was clearly severely ruptured.  The capsule was very firm.  Circumferential capsulotomies were performed to allow for breast pocket expansion. The pocket was irrigated and the decision was made to excise the inferior or deep portion of  the pocket.  This came off easily.  Hemostasis was ensured with the electrocautery.  New gloves were applied. Two sizers were tried and it was clear that the 590 cc implant was too large.  The correctly fitting implant was soaked in antibiotic solution and placed in the pocket and oriented appropriately. The ADM and capsule on the were  re-closed with a 3-0 PDS suture. The remaining skin was closed with 3-0 PDS deep dermal and 4-0 Monocryl subcuticular stitches.  Dermabond was applied to the incision site. A breast binder and ABDs were placed.  The patient was allowed to wake from anesthesia and taken to the recovery room in satisfactory condition.   The advanced practice practitioner (APP) assisted throughout the case.  The APP was essential in retraction and counter traction when needed to make the case progress smoothly.  This retraction and assistance made it possible to see the tissue plans for the procedure.  The assistance was needed for blood control, tissue re-approximation and assisted with closure of the incision site.

## 2023-02-11 NOTE — Anesthesia Preprocedure Evaluation (Addendum)
Anesthesia Evaluation  Patient identified by MRN, date of birth, ID band Patient awake    Reviewed: Allergy & Precautions, NPO status , Patient's Chart, lab work & pertinent test results  Airway Mallampati: II  TM Distance: >3 FB Neck ROM: Full    Dental no notable dental hx.    Pulmonary neg pulmonary ROS   Pulmonary exam normal        Cardiovascular hypertension,  Rhythm:Regular Rate:Normal     Neuro/Psych negative neurological ROS  negative psych ROS   GI/Hepatic Neg liver ROS,GERD  ,,  Endo/Other  negative endocrine ROS    Renal/GU negative Renal ROS  negative genitourinary   Musculoskeletal negative musculoskeletal ROS (+)    Abdominal Normal abdominal exam  (+)   Peds  Hematology negative hematology ROS (+)   Anesthesia Other Findings   Reproductive/Obstetrics                             Anesthesia Physical Anesthesia Plan  ASA: 2  Anesthesia Plan: General   Post-op Pain Management:    Induction: Intravenous  PONV Risk Score and Plan: 3 and Ondansetron, Dexamethasone, Midazolam and Treatment may vary due to age or medical condition  Airway Management Planned: Mask and LMA  Additional Equipment: None  Intra-op Plan:   Post-operative Plan: Extubation in OR  Informed Consent: I have reviewed the patients History and Physical, chart, labs and discussed the procedure including the risks, benefits and alternatives for the proposed anesthesia with the patient or authorized representative who has indicated his/her understanding and acceptance.     Dental advisory given  Plan Discussed with: CRNA  Anesthesia Plan Comments:        Anesthesia Quick Evaluation

## 2023-02-11 NOTE — Discharge Instructions (Addendum)
INSTRUCTIONS FOR AFTER BREAST SURGERY   You will likely have some questions about what to expect following your operation.  The following information will help you and your family understand what to expect when you are discharged from the hospital.  It is important to follow these guidelines to help ensure a smooth recovery and reduce complication.  Postoperative instructions include information on: diet, wound care, medications and physical activity.  AFTER SURGERY Expect to go home after the procedure.  In some cases, you may need to spend one night in the hospital for observation.  DIET Breast surgery does not require a specific diet.  However, the healthier you eat the better your body will heal. It is important to increasing your protein intake.  This means limiting the foods with sugar and carbohydrates.  Focus on vegetables and some meat.  If you have liposuction during your procedure be sure to drink water.  If your urine is bright yellow, then it is concentrated, and you need to drink more water.  As a general rule after surgery, you should have 8 ounces of water every hour while awake.  If you find you are persistently nauseated or unable to take in liquids let us know.  NO TOBACCO USE or EXPOSURE.  This will slow your healing process and lead to a wound.  WOUND CARE Leave the binder on for 3 days . Use fragrance free soap like Dial, Dove or Ivory.   After 3 days you can remove the binder to shower. Once dry apply binder or sports bra. If you have liposuction you will have a soft and spongy dressing (Lipofoam) that helps prevent creases in your skin.  Remove before you shower and then replace it.  It is also available on [employer]. If you have steri-strips / tape directly attached to your skin leave them in place. It is OK to get these wet.   No baths, pools or hot tubs for four weeks. We close your incision to leave the smallest and best-looking scar. No ointment or creams on your incisions  for four weeks.  No Neosporin (Too many skin reactions).  A few weeks after surgery you can use Mederma and start massaging the scar. We ask you to wear your binder or sports bra for the first 6 weeks around the clock, including while sleeping. This provides added comfort and helps reduce the fluid accumulation at the surgery site. NO Ice or heating pads to the operative site.  You have a very high risk of a BURN before you feel the temperature change.  ACTIVITY No heavy lifting until cleared by the doctor.  This usually means no more than a half-gallon of milk.  It is OK to walk and climb stairs. Moving your legs is very important to decrease your risk of a blood clot.  It will also help keep you from getting deconditioned.  Every 1 to 2 hours get up and walk for 5 minutes. This will help with a quicker recovery back to normal.  Let pain be your guide so you don't do too much.  This time is for you to recover.  You will be more comfortable if you sleep and rest with your head elevated either with a few pillows under you or in a recliner.  No stomach sleeping for a three months.  WORK Everyone returns to work at different times. As a rough guide, most people take at least 1 - 2 weeks off prior to returning to work. If   you need documentation for your job, give the forms to the front staff at the clinic.  DRIVING Arrange for someone to bring you home from the hospital after your surgery.  You may be able to drive a few days after surgery but not while taking any narcotics or valium.  BOWEL MOVEMENTS Constipation can occur after anesthesia and while taking pain medication.  It is important to stay ahead for your comfort.  We recommend taking Milk of Magnesia (2 tablespoons; twice a day) while taking the pain pills.  MEDICATIONS You may be prescribed should start after surgery At your preoperative visit for you history and physical you may have been given the following medications: An antibiotic: Start  this medication when you get home and take according to the instructions on the bottle. Zofran 4 mg:  This is to treat nausea and vomiting.  You can take this every 6 hours as needed and only if needed. Valium 2 mg for breast cancer patients: This is for muscle tightness if you have an implant or expander. This will help relax your muscle which also helps with pain control.  This can be taken every 12 hours as needed. Don't drive after taking this medication. Norco (hydrocodone/acetaminophen) 5/325 mg:  This is only to be used after you have taken the Motrin or the Tylenol. Every 8 hours as needed.   Over the counter Medication to take: Ibuprofen (Motrin) 600 mg:  Take this every 6 hours.  If you have additional pain then take 500 mg of the Tylenol every 8 hours.  Only take the Norco after you have tried these two. MiraLAX or Milk of Magnesia: Take this according to the bottle if you take the Norco.  WHEN TO CALL Call your surgeon's office if any of the following occur: Fever 101 degrees F or greater Excessive bleeding or fluid from the incision site. Pain that increases over time without aid from the medications Redness, warmth, or pus draining from incision sites Persistent nausea or inability to take in liquids Severe misshapen area that underwent the operation.  Post Anesthesia Home Care Instructions  Activity: Get plenty of rest for the remainder of the day. A responsible individual must stay with you for 24 hours following the procedure.  For the next 24 hours, DO NOT: -Drive a car -Operate machinery -Drink alcoholic beverages -Take any medication unless instructed by your physician -Make any legal decisions or sign important papers.  Meals: Start with liquid foods such as gelatin or soup. Progress to regular foods as tolerated. Avoid greasy, spicy, heavy foods. If nausea and/or vomiting occur, drink only clear liquids until the nausea and/or vomiting subsides. Call your  physician if vomiting continues.  Special Instructions/Symptoms: Your throat may feel dry or sore from the anesthesia or the breathing tube placed in your throat during surgery. If this causes discomfort, gargle with warm salt water. The discomfort should disappear within 24 hours.  If you had a scopolamine patch placed behind your ear for the management of post- operative nausea and/or vomiting:  1. The medication in the patch is effective for 72 hours, after which it should be removed.  Wrap patch in a tissue and discard in the trash. Wash hands thoroughly with soap and water. 2. You may remove the patch earlier than 72 hours if you experience unpleasant side effects which may include dry mouth, dizziness or visual disturbances. 3. Avoid touching the patch. Wash your hands with soap and water after contact with the patch.     

## 2023-02-12 ENCOUNTER — Encounter (HOSPITAL_BASED_OUTPATIENT_CLINIC_OR_DEPARTMENT_OTHER): Payer: Self-pay | Admitting: Plastic Surgery

## 2023-02-12 ENCOUNTER — Encounter: Payer: Self-pay | Admitting: Plastic Surgery

## 2023-02-12 ENCOUNTER — Encounter: Payer: Self-pay | Admitting: Gastroenterology

## 2023-02-12 LAB — SURGICAL PATHOLOGY

## 2023-02-12 NOTE — Progress Notes (Cosign Needed)
Patient is a pleasant 47 year old female s/p bilateral breast implant removal and replacement as well as excision of left breast mass performed 02/11/2023 by Dr. Ulice Bold who joins via telephone for postoperative visit.  Reviewed the pathology and the mass that was excised was consistent with benign breast parenchymal tissue with chronic inflammation, foreign body giant cell reaction, and fibrosis.  No malignancy identified.  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  The capsule obtained was also submitted to pathology and negative for malignancy.  The patient was at home and this provider was calling from their office.  A total of 5 minutes was spent speaking with the patient and reviewing chart.    She states that the left-sided implant placement site had felt tight and "hard" prior to surgery is now feeling much improved, comparable to her right side.  She denies any ongoing pain symptoms.  She states that she is eating and drinking without difficulty.  Voiding.  Ambulatory.  No questions or concerns at present.  Discussed results of the pathology as well as what we saw clinically during the surgery.  Plan for her to follow-up next week, as scheduled.  She will call the clinic should she have questions or concerns in the interim.

## 2023-02-13 ENCOUNTER — Ambulatory Visit (INDEPENDENT_AMBULATORY_CARE_PROVIDER_SITE_OTHER): Payer: BC Managed Care – PPO | Admitting: Physician Assistant

## 2023-02-13 DIAGNOSIS — Z9889 Other specified postprocedural states: Secondary | ICD-10-CM

## 2023-02-19 NOTE — Progress Notes (Signed)
Patient is a pleasant 47 year old female s/p bilateral breast implant removal and replacement as well as excision of left breast mass performed 02/11/2023 by Dr. Ulice Bold who presents to clinic for postoperative follow-up.  550 cc smooth round high-profile gel implant was placed on the left side, 590 cc smooth round ultrahigh profile gel placed on the right side.  Today, patient is doing well.  She states the left side was a bit uncomfortable after surgery, but denied any pain symptoms whatsoever on the right side.  She also understands the left side was the problem side where the mass was excised and she had implant rupture.  She states that it has improved considerably and she is not requiring any narcotic analgesics.  She also denies any fevers, chest pain, difficulty breathing, difficulty voiding, nausea, or other symptoms.  She is overall quite pleased.  Bandages removed from her incisions.  Will not remove the underlying Steri-Strips as they are firmly intact.  No incisional drainage noted on the Steri-Strips.  Breasts have good shape and symmetry.  No palpable subcutaneous fluid collections concerning for seroma or hematoma.  No overlying skin changes.  She states that the Steri-Strips are not bothersome or itchy.  Will leave them in place given that they are firmly intact.  Will remove at next encounter.  Recommend ongoing activity modifications and compressive garments in the interim.  Will obtain photos at next encounter.

## 2023-02-20 ENCOUNTER — Encounter: Payer: Self-pay | Admitting: Physician Assistant

## 2023-02-20 ENCOUNTER — Encounter: Payer: Self-pay | Admitting: Plastic Surgery

## 2023-02-20 ENCOUNTER — Ambulatory Visit (INDEPENDENT_AMBULATORY_CARE_PROVIDER_SITE_OTHER): Payer: Self-pay | Admitting: Physician Assistant

## 2023-02-20 DIAGNOSIS — Z9889 Other specified postprocedural states: Secondary | ICD-10-CM

## 2023-03-02 ENCOUNTER — Ambulatory Visit (AMBULATORY_SURGERY_CENTER): Payer: BC Managed Care – PPO

## 2023-03-02 VITALS — Ht 70.0 in | Wt 160.0 lb

## 2023-03-02 DIAGNOSIS — Z1211 Encounter for screening for malignant neoplasm of colon: Secondary | ICD-10-CM

## 2023-03-02 NOTE — Progress Notes (Signed)

## 2023-03-03 ENCOUNTER — Ambulatory Visit (INDEPENDENT_AMBULATORY_CARE_PROVIDER_SITE_OTHER): Payer: BC Managed Care – PPO | Admitting: Plastic Surgery

## 2023-03-03 ENCOUNTER — Encounter: Payer: Self-pay | Admitting: Plastic Surgery

## 2023-03-03 DIAGNOSIS — Z9013 Acquired absence of bilateral breasts and nipples: Secondary | ICD-10-CM

## 2023-03-03 NOTE — Progress Notes (Signed)
The patient is a 47 year old female here for follow-up after undergoing implant exchange.  Her original surgery was October 2013.  She underwent removal and replacement of implants February 11, 2023.  She has a smooth round high-profile gel 550 cc implant in on the left and a smooth round ultra high-profile gel 590 cc on the right.  The difference was because she looked much bigger on the left compared to the right with the previous implants.  We were also able to deal with the capsular contracture.  She is much softer and has really good symmetry today.  She is healing nicely.  No sign of infection.  No sign of seroma.  Pictures were obtained of the patient and placed in the chart with the patient's or guardian's permission.  Follow-up in 3 weeks.

## 2023-03-16 ENCOUNTER — Encounter: Payer: Self-pay | Admitting: Gastroenterology

## 2023-03-16 NOTE — Progress Notes (Unsigned)
Patient is a pleasant 47 year old female s/p bilateral breast implant removal and replacement as well as excision of left breast mass performed 02/11/2023 by Dr. Ulice Bold who presents to clinic for postoperative follow-up.   She was last seen here in clinic 03/03/2023.  At that time, exam was entirely reassuring.  Plan was for her to follow-up in 3 weeks.  Today,

## 2023-03-17 ENCOUNTER — Ambulatory Visit (INDEPENDENT_AMBULATORY_CARE_PROVIDER_SITE_OTHER): Payer: BC Managed Care – PPO | Admitting: Physician Assistant

## 2023-03-17 VITALS — BP 112/70 | HR 61

## 2023-03-17 DIAGNOSIS — Z9013 Acquired absence of bilateral breasts and nipples: Secondary | ICD-10-CM

## 2023-03-31 ENCOUNTER — Ambulatory Visit: Payer: BC Managed Care – PPO | Admitting: Gastroenterology

## 2023-03-31 ENCOUNTER — Encounter: Payer: Self-pay | Admitting: Gastroenterology

## 2023-03-31 VITALS — BP 114/71 | HR 58 | Temp 98.0°F | Resp 15 | Ht 70.0 in | Wt 169.0 lb

## 2023-03-31 DIAGNOSIS — Z1211 Encounter for screening for malignant neoplasm of colon: Secondary | ICD-10-CM | POA: Diagnosis present

## 2023-03-31 MED ORDER — SODIUM CHLORIDE 0.9 % IV SOLN
500.0000 mL | INTRAVENOUS | Status: AC
Start: 2023-03-31 — End: ?

## 2023-03-31 NOTE — Progress Notes (Signed)
History & Physical  Primary Care Physician:  Julianne Handler, NP Primary Gastroenterologist: Claudette Head, MD  Impression / Plan:  Average risk CRC screening for colonoscopy.  CHIEF COMPLAINT:  CRC screening  HPI: Betty [name] is a 47 y.o. female average risk CRC screening for colonoscopy.    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---  S/P BILATERAL MASTECTOMY W/ RECONSTRUCTION  08-05-2012--  CHEMO ENEDED 06-22-2012   ONCOLOGIST- DR Darnelle Catalan; chemo   GERD (gastroesophageal reflux disease)    HEARTBURN OCCASIONALLY    Heart murmur    TOLD THIS AS A CHILD, NO PROBLEMS OTHERWISE   Preeclampsia    DURING PREGNANCY ; REPORTS AS RESOLVED     Past Surgical History:  Procedure Laterality Date   BREAST CYST EXCISION Left 02/11/2023   Procedure: excision of left breast mass;  Surgeon: Peggye Form, DO;  Location: [facility];  Service: Plastics;  Laterality: Left;   BREAST RECONSTRUCTION  08/05/2012   Procedure: BREAST RECONSTRUCTION;  Surgeon: Wayland Denis, DO;  Location: [facility];  Service: Plastics;  Laterality: Bilateral;   INGUINAL HERNIA REPAIR  AS INFANT   LAPAROSCOPIC SALPINGO OOPHERECTOMY Bilateral 02/15/2018   Procedure: LAPAROSCOPIC SALPINGO OOPHORECTOMY;  Surgeon: Richarda Overlie, MD;  [facility];  Service: Gynecology;  Laterality: Bilateral;   MASTECTOMY W/ SENTINEL NODE BIOPSY  08/05/2012   Procedure: MASTECTOMY WITH SENTINEL LYMPH NODE BIOPSY;  Surgeon: Shelly Rubenstein, MD;  Location: [facility];  Service: General;  Laterality: Right;   AND LEFT MASTECTOMY   PORT-A-CATH REMOVAL  08/05/2012   Procedure: REMOVAL PORT-A-CATH;  Surgeon: Shelly Rubenstein, MD;  Location: [facility];  Service: General;  Laterality: N/A;   PORTACATH PLACEMENT  03/10/2012   Procedure: INSERTION PORT-A-CATH;  Surgeon: Shelly Rubenstein, MD;  Location: [facility];   Service: General;  Laterality: N/A;   REMOVAL OF BILATERAL TISSUE EXPANDERS WITH PLACEMENT OF BILATERAL BREAST IMPLANTS Bilateral 02/11/2023   Procedure: Bilateral removal of breast implants and replacement and excision of left breast mass;  Surgeon: Peggye Form, DO;  Location: [facility];  Service: Plastics;  Laterality: Bilateral;   SHOULDER SURGERY  1996  &  1999   - labrum and capsular repair   TISSUE EXPANDER PLACEMENT  08/05/2012   Procedure: TISSUE EXPANDER;  Surgeon: Wayland Denis, DO;  Location: [facility];  Service: Plastics;  Laterality: Bilateral;  With Expanders and Flex HD Placement    TRANSTHORACIC ECHOCARDIOGRAM  03-08-2012   NORMAL LVSF/ EF 60-65%   VAGINAL BIRTH AFTER CESAREAN SECTION  2018   PREECLAMPSIA     Prior to Admission medications   Medication Sig Start Date End Date Taking? Authorizing Provider  venlafaxine (EFFEXOR) 75 MG tablet    Yes [provider]  fluticasone (FLONASE) 50 MCG/ACT nasal spray Place 1 spray into both nostrils daily.    [provider]  ibuprofen (ADVIL) 200 MG tablet Take 3 tablets (600 mg total) by mouth every 6 (six) hours as needed for moderate pain. 02/15/18   Richarda Overlie, MD  ondansetron (ZOFRAN-ODT) 4 MG disintegrating tablet Take 1 tablet (4 mg total) by mouth every 8 (eight) hours as needed for nausea or vomiting. 02/06/23   Lorelee New, PA-C    Current Outpatient Medications  Medication Sig Dispense Refill   venlafaxine (EFFEXOR) 75 MG tablet  fluticasone (FLONASE) 50 MCG/ACT nasal spray Place 1 spray into both nostrils daily.     ibuprofen (ADVIL) 200 MG tablet Take 3 tablets (600 mg total) by mouth every 6 (six) hours as needed for moderate pain. 30 tablet 1   ondansetron (ZOFRAN-ODT) 4 MG disintegrating tablet Take 1 tablet (4 mg total) by mouth every 8 (eight) hours as needed for nausea or vomiting. 20 tablet 0   Current Facility-Administered Medications  Medication Dose Route  Frequency Provider Last Rate Last Admin   0.9 %  sodium chloride infusion  500 mL Intravenous Continuous Meryl Dare, MD        Allergies as of 03/31/2023 - Review Complete 03/31/2023  Allergen Reaction Noted   Dextromethorphan Rash 03/13/2011    Family History  Problem Relation Age of Onset   Ovarian cancer Mother 71   Cancer Mother        ovarian   Colon polyps Father    Cancer Paternal Uncle        leukemia   Cancer Paternal Uncle        lung cancer   Allergic rhinitis Other        grandmother   Colon cancer Neg Hx    Esophageal cancer Neg Hx    Stomach cancer Neg Hx    Rectal cancer Neg Hx    Colitis Neg Hx     Social History   Socioeconomic History   Marital status: Married    Spouse name: Not on file   Number of children: Not on file   Years of education: Not on file   Highest education level: Not on file  Occupational History   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  Tobacco Use   Smoking status: Never   Smokeless tobacco: Never  Vaping Use   Vaping Use: Never used  Substance and Sexual Activity   Alcohol use: Yes    Comment: occasionaly    Drug use: No   Sexual activity: Yes    Birth control/protection: Condom  Other Topics Concern   Not on file  Social History Narrative   Not on file   Social Determinants of Health   Financial Resource Strain: Not on file  Food Insecurity: Not on file  Transportation Needs: Not on file  Physical Activity: Not on file  Stress: Not on file  Social Connections: Not on file  Intimate Partner Violence: Not on file    Review of Systems:  All systems reviewed were negative except where noted in HPI.   Physical Exam:  General:  Alert, well-developed, in NAD Head:  Normocephalic and atraumatic. Eyes:  Sclera clear, no icterus.   Conjunctiva pink. Ears:  Normal auditory acuity. Mouth:  No deformity or lesions.  Neck:  Supple; no masses. Lungs:  Clear throughout to  auscultation.   No wheezes, crackles, or rhonchi.  Heart:  Regular rate and rhythm; no murmurs. Abdomen:  Soft, nondistended, nontender. No masses, hepatomegaly. No palpable masses.  Normal bowel sounds.    Rectal:  Deferred   Msk:  Symmetrical without gross deformities. Extremities:  Without edema. Neurologic:  Alert and  oriented x 4; grossly normal neurologically. Skin:  Intact without significant lesions or rashes. Psych:  Alert and cooperative. Normal mood and affect.  Venita Lick. Russella Dar  03/31/2023, 9:48 AM See Loretha Stapler, [facility], to contact our on call provider

## 2023-03-31 NOTE — Progress Notes (Signed)
To pacu, vss. Report to [email or web address] 

## 2023-03-31 NOTE — Patient Instructions (Signed)
YOU HAD AN ENDOSCOPIC PROCEDURE TODAY AT THE [facility]:   Refer to the procedure report that was given to you for any specific questions about what was found during the examination.  If the procedure report does not answer your questions, please call your gastroenterologist to clarify.  If you requested that your care partner not be given the details of your procedure findings, then the procedure report has been included in a sealed envelope for you to review at your convenience later.  YOU SHOULD EXPECT: Some feelings of bloating in the abdomen. Passage of more gas than usual.  Walking can help get rid of the air that was put into your GI tract during the procedure and reduce the bloating. If you had a lower endoscopy (such as a colonoscopy or flexible sigmoidoscopy) you may notice spotting of blood in your stool or on the toilet paper. If you underwent a bowel prep for your procedure, you may not have a normal bowel movement for a few days.  Please Note:  You might notice some irritation and congestion in your nose or some drainage.  This is from the oxygen used during your procedure.  There is no need for concern and it should clear up in a day or so.  SYMPTOMS TO REPORT IMMEDIATELY:  Following lower endoscopy (colonoscopy or flexible sigmoidoscopy):  Excessive amounts of blood in the stool  Significant tenderness or worsening of abdominal pains  Swelling of the abdomen that is new, acute  Fever of 100F or higher   For urgent or emergent issues, a gastroenterologist can be reached at any hour by calling [phone]. Do not use MyChart messaging for urgent concerns.    DIET:  We do recommend a small meal at first, but then you may proceed to your regular diet.  Drink plenty of fluids but you should avoid alcoholic beverages for 24 hours.  MEDICATIONS: Continue present medications.  FOLLOW UP: Repeat colonoscopy in 10 years for screening purposes.  Thank you for allowing  us to provide for your healthcare needs today.  ACTIVITY:  You should plan to take it easy for the rest of today and you should NOT DRIVE or use heavy machinery until tomorrow (because of the sedation medicines used during the test).    FOLLOW UP: Our staff will call the number listed on your records the next business day following your procedure.  We will call around 7:15- 8:00 am to check on you and address any questions or concerns that you may have regarding the information given to you following your procedure. If we do not reach you, we will leave a message.     If any biopsies were taken you will be contacted by phone or by letter within the next 1-3 weeks.  Please call us at [phone] if you have not heard about the biopsies in 3 weeks.    SIGNATURES/CONFIDENTIALITY: You and/or your care partner have signed paperwork which will be entered into your electronic medical record.  These signatures attest to the fact that that the information above on your After Visit Summary has been reviewed and is understood.  Full responsibility of the confidentiality of this discharge information lies with you and/or your care-partner. 

## 2023-03-31 NOTE — Progress Notes (Signed)
Patient reports no changes in health or medications since pre visit. 

## 2023-03-31 NOTE — Op Note (Signed)
[facility] Patient Name: Betty [name] Procedure Date: 03/31/2023 9:46 AM MRN: [id number] Endoscopist: Meryl Dare , MD, [phone] Age: 47 Referring MD:  Date of Birth: 1976/01/29 Gender: Female Account #: [id number] Procedure:                Colonoscopy Indications:              Screening for colorectal malignant neoplasm Medicines:                Monitored Anesthesia Care Procedure:                Pre-Anesthesia Assessment:                           - Prior to the procedure, a History and Physical                            was performed, and patient medications and                            allergies were reviewed. The patient's tolerance of                            previous anesthesia was also reviewed. The risks                            and benefits of the procedure and the sedation                            options and risks were discussed with the patient.                            All questions were answered, and informed consent                            was obtained. Prior Anticoagulants: The patient has                            taken no anticoagulant or antiplatelet agents. ASA                            Grade Assessment: II - A patient with mild systemic                            disease. After reviewing the risks and benefits,                            the patient was deemed in satisfactory condition to                            undergo the procedure.                           After obtaining informed consent, the colonoscope  was passed under direct vision. Throughout the                            procedure, the patient's blood pressure, pulse, and                            oxygen saturations were monitored continuously. The                            Olympus CF-HQ190L [id number]) Colonoscope was                            introduced through the anus and advanced to the the                            cecum,  identified by appendiceal orifice and                            ileocecal valve. The ileocecal valve, appendiceal                            orifice, and rectum were photographed. The quality                            of the bowel preparation was excellent. The                            colonoscopy was performed without difficulty. The                            patient tolerated the procedure well. Scope In: 9:56:56 AM Scope Out: 10:13:28 AM Scope Withdrawal Time: 0 hours 9 minutes 4 seconds  Total Procedure Duration: 0 hours 16 minutes 32 seconds  Findings:                 The perianal and digital rectal examinations were                            normal.                           The entire examined colon appeared normal on direct                            and retroflexion views. Complications:            No immediate complications. Estimated blood loss:                            None. Estimated Blood Loss:     Estimated blood loss: none. Impression:               - The entire examined colon is normal on direct and                            retroflexion views.                           -  No specimens collected. Recommendation:           - Repeat colonoscopy in 10 years for screening                            purposes.                           - Patient has a contact number available for                            emergencies. The signs and symptoms of potential                            delayed complications were discussed with the                            patient. Return to normal activities tomorrow.                            Written discharge instructions were provided to the                            patient.                           - Resume previous diet.                           - Continue present medications. Meryl Dare, MD 03/31/2023 10:15:31 AM This report has been signed electronically.

## 2023-04-01 ENCOUNTER — Telehealth: Payer: Self-pay | Admitting: *Deleted

## 2023-04-01 NOTE — Telephone Encounter (Signed)
No answer on  follow up call. Left message.   

## 2023-09-04 ENCOUNTER — Ambulatory Visit: Payer: BC Managed Care – PPO | Admitting: Plastic Surgery

## 2023-11-17 ENCOUNTER — Encounter: Payer: Self-pay | Admitting: Plastic Surgery

## 2023-11-17 ENCOUNTER — Ambulatory Visit (INDEPENDENT_AMBULATORY_CARE_PROVIDER_SITE_OTHER): Payer: 59 | Admitting: Plastic Surgery

## 2023-11-17 VITALS — BP 153/97 | HR 79 | Ht 70.0 in | Wt 183.0 lb

## 2023-11-17 DIAGNOSIS — Z08 Encounter for follow-up examination after completed treatment for malignant neoplasm: Secondary | ICD-10-CM

## 2023-11-17 DIAGNOSIS — Z9013 Acquired absence of bilateral breasts and nipples: Secondary | ICD-10-CM | POA: Diagnosis not present

## 2023-11-17 DIAGNOSIS — Z853 Personal history of malignant neoplasm of breast: Secondary | ICD-10-CM | POA: Diagnosis not present

## 2023-11-17 NOTE — Progress Notes (Signed)
   Subjective:    Patient ID: Betty [name], female    DOB: 1976/06/21, 48 y.o.   MRN: [id number]  The patient is a 48 year old female here for follow-up after undergoing bilateral breast reconstruction.  The patient was diagnosed with breast cancer in 2013.  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  She was around in a size breast cup and wanted to be around to be Ara-C cup size.  She is 5 feet 10 inches tall.  She does have a family history of ovarian cancer and breast cancer.  Her sister was also BRCA positive.  She underwent immediate bilateral breast reconstruction after mastectomy on August 06, 2012.  She had the expanders switched to implants in April 2014.  She had Mentor smooth round high-profile gel 590 cc implants placed.  In April 2024 she underwent removal of the of bilateral implants and placement of Mentor smooth round high-profile gel 550 cc on the left and 590 cc placed on the right.  And this was due to some slight asymmetry.  She also had left capsulectomy with repositioning of the implant which helped a great deal since the implant had been ruptured this was likely the cause of the excess tightness.  Today she is doing really well and is very happy.  No signs of concern.      Review of Systems  Constitutional: Negative.   HENT: Negative.    Eyes: Negative.   Respiratory: Negative.    Cardiovascular: Negative.   Gastrointestinal: Negative.   Endocrine: Negative.   Genitourinary: Negative.   Musculoskeletal: Negative.        Objective:   Physical Exam Vitals reviewed.  Constitutional:      Appearance: Normal appearance.  HENT:     Head: Atraumatic.  Cardiovascular:     Rate and Rhythm: Normal rate.     Pulses: Normal pulses.  Pulmonary:     Effort: Pulmonary effort is normal.  Skin:    General: Skin is warm.     Capillary Refill: Capillary refill takes less  than 2 seconds.  Neurological:     Mental Status: She is alert and oriented to person, place, and time.  Psychiatric:        Mood and Affect: Mood normal.        Behavior: Behavior normal.        Thought Content: Thought content normal.        Judgment: Judgment normal.        Assessment & Plan:     ICD-10-CM   1. S/P mastectomy, bilateral  Z90.13        No changes needed and will plan to see the patient back in 1 year.   Pictures were obtained of the patient and placed in the chart with the patient's or guardian's permission.

## 2024-11-18 ENCOUNTER — Ambulatory Visit: Payer: 59 | Admitting: Plastic Surgery

## 2025-01-20 ENCOUNTER — Ambulatory Visit: Payer: Self-pay | Admitting: Plastic Surgery
